# Patient Record
Sex: Male | Born: 1960 | Race: White | Hispanic: No | Smoking: Never smoker
Health system: Southern US, Community
[De-identification: ages and names within clinical notes are randomized; demographics above are authoritative.]

## PROBLEM LIST (undated history)

## (undated) DIAGNOSIS — E785 Hyperlipidemia, unspecified: Secondary | ICD-10-CM

## (undated) DIAGNOSIS — E039 Hypothyroidism, unspecified: Secondary | ICD-10-CM

## (undated) DIAGNOSIS — R011 Cardiac murmur, unspecified: Secondary | ICD-10-CM

## (undated) DIAGNOSIS — Z5189 Encounter for other specified aftercare: Secondary | ICD-10-CM

## (undated) DIAGNOSIS — I10 Essential (primary) hypertension: Secondary | ICD-10-CM

## (undated) DIAGNOSIS — J449 Chronic obstructive pulmonary disease, unspecified: Secondary | ICD-10-CM

## (undated) DIAGNOSIS — J45909 Unspecified asthma, uncomplicated: Secondary | ICD-10-CM

## (undated) DIAGNOSIS — M549 Dorsalgia, unspecified: Secondary | ICD-10-CM

## (undated) DIAGNOSIS — E781 Pure hyperglyceridemia: Secondary | ICD-10-CM

## (undated) DIAGNOSIS — M1990 Unspecified osteoarthritis, unspecified site: Secondary | ICD-10-CM

## (undated) DIAGNOSIS — T7840XA Allergy, unspecified, initial encounter: Secondary | ICD-10-CM

## (undated) DIAGNOSIS — D649 Anemia, unspecified: Secondary | ICD-10-CM

## (undated) HISTORY — DX: Chronic obstructive pulmonary disease, unspecified: J44.9

## (undated) HISTORY — DX: Allergy, unspecified, initial encounter: T78.40XA

## (undated) HISTORY — DX: Pure hyperglyceridemia: E78.1

## (undated) HISTORY — DX: Encounter for other specified aftercare: Z51.89

## (undated) HISTORY — PX: KNEE SURGERY: SHX244

## (undated) HISTORY — PX: FRACTURE SURGERY: SHX138

## (undated) HISTORY — PX: JOINT REPLACEMENT: SHX530

## (undated) HISTORY — DX: Hypothyroidism, unspecified: E03.9

## (undated) HISTORY — DX: Unspecified osteoarthritis, unspecified site: M19.90

## (undated) HISTORY — DX: Cardiac murmur, unspecified: R01.1

## (undated) HISTORY — DX: Unspecified asthma, uncomplicated: J45.909

## (undated) HISTORY — DX: Anemia, unspecified: D64.9

## (undated) HISTORY — DX: Hyperlipidemia, unspecified: E78.5

---

## 2003-04-02 ENCOUNTER — Inpatient Hospital Stay (HOSPITAL_COMMUNITY): Admission: EM | Admit: 2003-04-02 | Discharge: 2003-04-09 | Payer: Self-pay

## 2004-05-19 ENCOUNTER — Inpatient Hospital Stay (HOSPITAL_COMMUNITY): Admission: RE | Admit: 2004-05-19 | Discharge: 2004-05-22 | Payer: Self-pay | Admitting: Orthopedic Surgery

## 2004-06-23 ENCOUNTER — Inpatient Hospital Stay (HOSPITAL_COMMUNITY): Admission: RE | Admit: 2004-06-23 | Discharge: 2004-06-27 | Payer: Self-pay | Admitting: Emergency Medicine

## 2004-06-24 ENCOUNTER — Encounter: Payer: Self-pay | Admitting: Orthopedic Surgery

## 2004-07-27 ENCOUNTER — Observation Stay (HOSPITAL_COMMUNITY): Admission: RE | Admit: 2004-07-27 | Discharge: 2004-07-28 | Payer: Self-pay | Admitting: Orthopedic Surgery

## 2005-04-21 ENCOUNTER — Emergency Department (HOSPITAL_COMMUNITY): Admission: EM | Admit: 2005-04-21 | Discharge: 2005-04-21 | Payer: Self-pay | Admitting: Emergency Medicine

## 2005-05-25 ENCOUNTER — Inpatient Hospital Stay (HOSPITAL_COMMUNITY): Admission: RE | Admit: 2005-05-25 | Discharge: 2005-05-30 | Payer: Self-pay | Admitting: Orthopedic Surgery

## 2005-06-22 ENCOUNTER — Encounter: Admission: RE | Admit: 2005-06-22 | Discharge: 2005-06-22 | Payer: Self-pay | Admitting: Orthopedic Surgery

## 2005-08-22 ENCOUNTER — Encounter: Admission: RE | Admit: 2005-08-22 | Discharge: 2005-08-22 | Payer: Self-pay | Admitting: Orthopedic Surgery

## 2005-10-23 ENCOUNTER — Encounter: Admission: RE | Admit: 2005-10-23 | Discharge: 2005-10-23 | Payer: Self-pay | Admitting: Orthopedic Surgery

## 2006-02-14 ENCOUNTER — Encounter: Admission: RE | Admit: 2006-02-14 | Discharge: 2006-02-14 | Payer: Self-pay | Admitting: Orthopedic Surgery

## 2006-02-16 ENCOUNTER — Encounter: Admission: RE | Admit: 2006-02-16 | Discharge: 2006-02-16 | Payer: Self-pay | Admitting: Orthopedic Surgery

## 2006-07-21 IMAGING — CR DG FEMUR 2+V*R*
5 series · 5 of 5 positions shown · non-contrast
Comparison: 04/21/05 and post operative films from 05/25/05.

CLINICAL DATA: 44 year old with removal of plates and screws, bone graft, and intramedullary rod placement right femur. 
 TWO VIEW RIGHT FEMUR:

[t femur with hip  ap right]
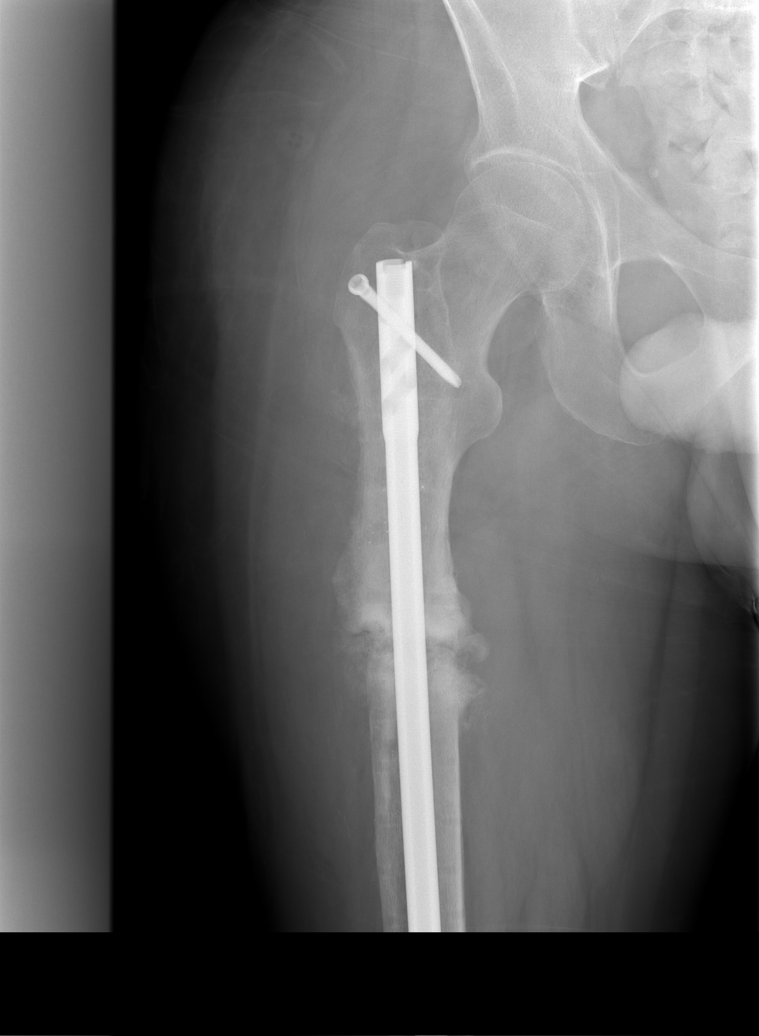

[t femur with knee ap right]
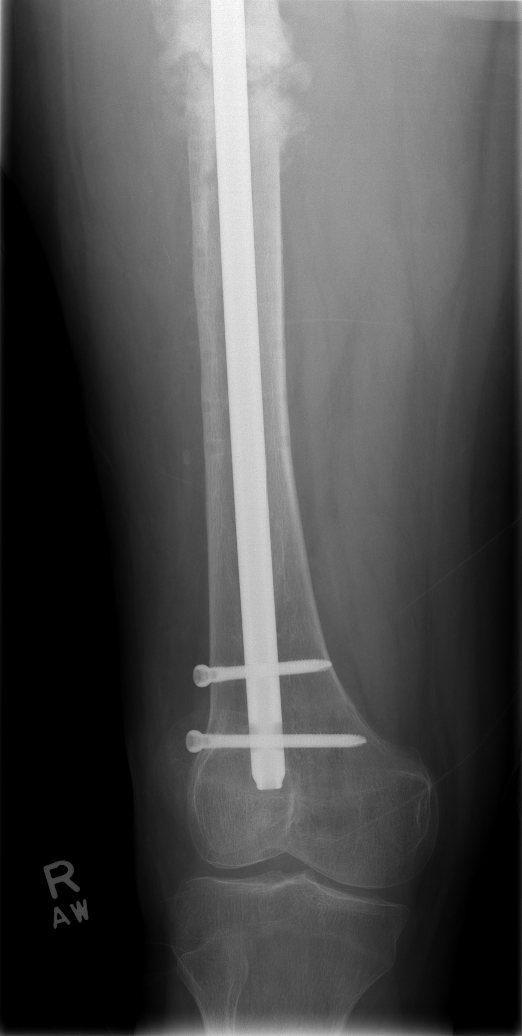

[t femur with hip lat right (1 of 2)]
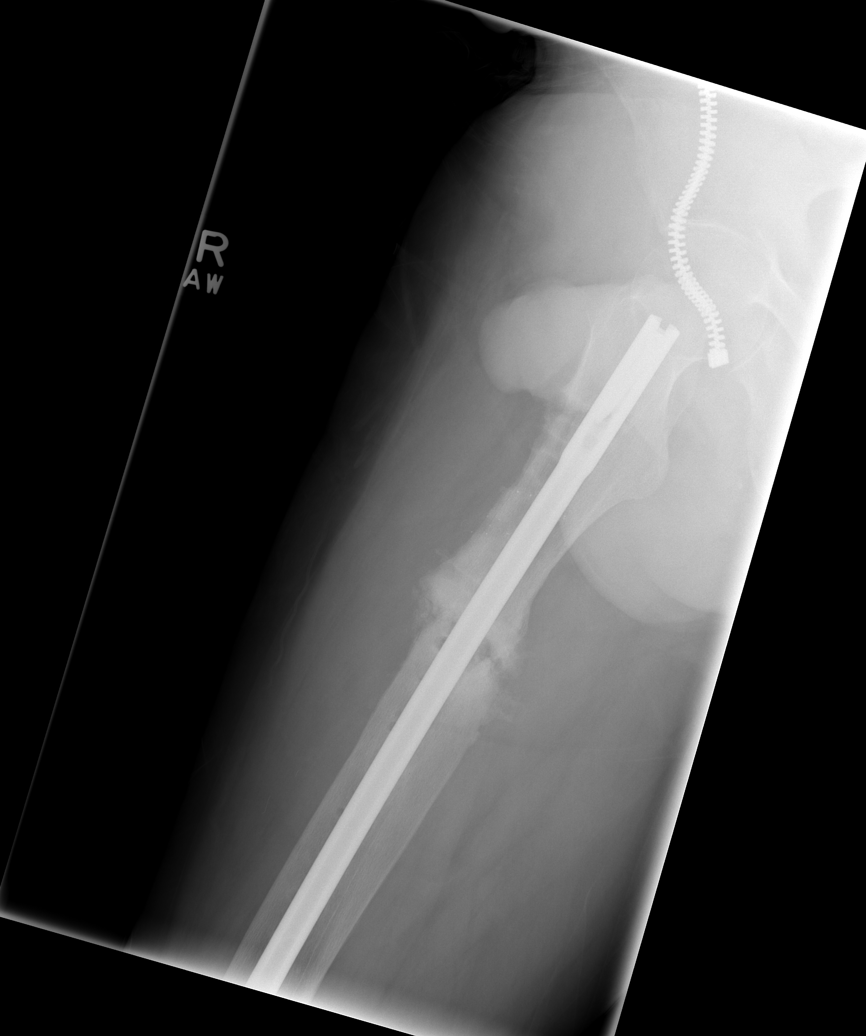

[t femur with hip lat right (2 of 2)]
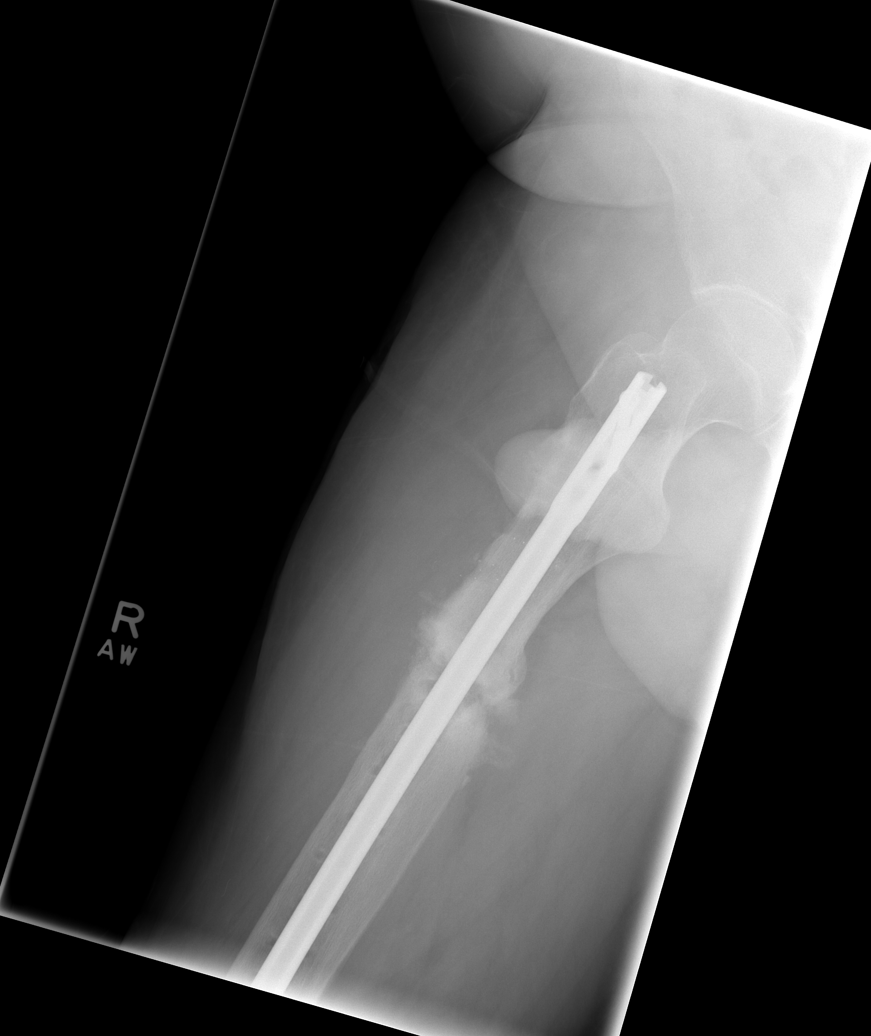

[t femur with knee lat right]
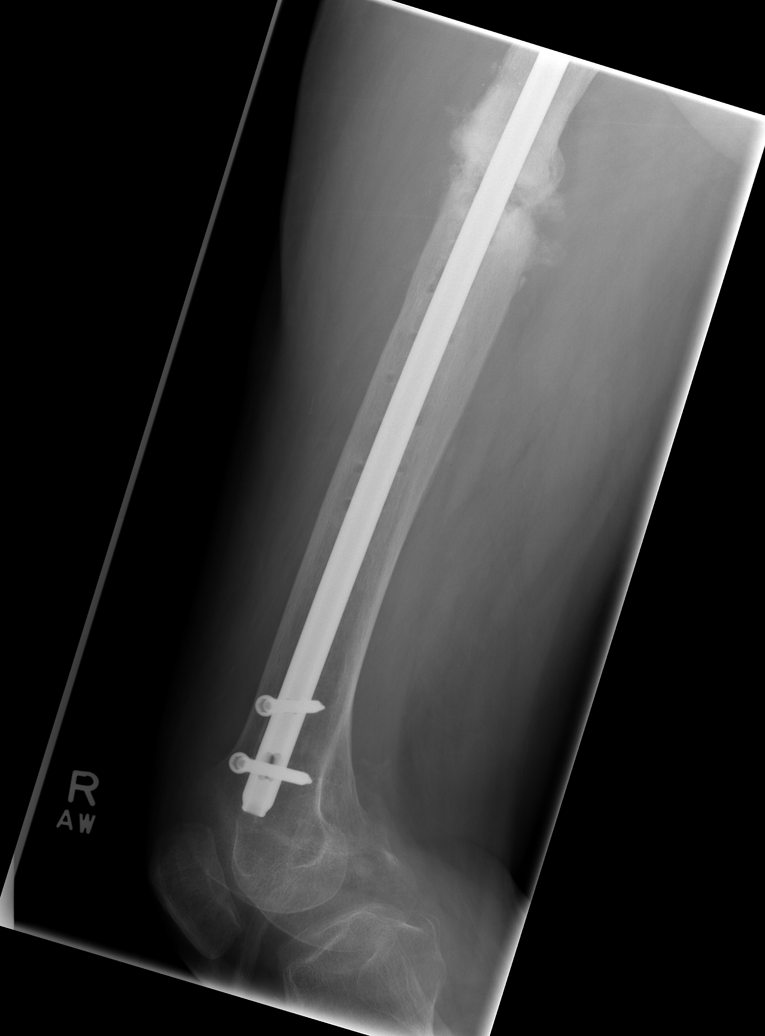

[5 of 5 positions shown; findings below may reference images not displayed]

There does appear to be some bony ingrowth at the fracture site.  The screw holes are still visualized.  The proximal and interlocking screws at intact.
IMPRESSION: 1.  Some evidence of bony ingrowth at the fracture site.

## 2006-08-22 ENCOUNTER — Ambulatory Visit (HOSPITAL_COMMUNITY): Admission: RE | Admit: 2006-08-22 | Discharge: 2006-08-22 | Payer: Self-pay | Admitting: Orthopedic Surgery

## 2006-09-07 ENCOUNTER — Encounter: Admission: RE | Admit: 2006-09-07 | Discharge: 2006-09-07 | Payer: Self-pay | Admitting: Orthopedic Surgery

## 2006-10-17 ENCOUNTER — Encounter: Admission: RE | Admit: 2006-10-17 | Discharge: 2006-10-17 | Payer: Self-pay | Admitting: Orthopedic Surgery

## 2007-04-18 ENCOUNTER — Encounter: Admission: RE | Admit: 2007-04-18 | Discharge: 2007-04-18 | Payer: Self-pay | Admitting: Orthopedic Surgery

## 2007-10-31 ENCOUNTER — Encounter: Admission: RE | Admit: 2007-10-31 | Discharge: 2007-10-31 | Payer: Self-pay | Admitting: Orthopedic Surgery

## 2008-05-06 ENCOUNTER — Encounter: Admission: RE | Admit: 2008-05-06 | Discharge: 2008-05-06 | Payer: Self-pay | Admitting: Anesthesiology
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
## 2009-05-13 ENCOUNTER — Encounter: Admission: RE | Admit: 2009-05-13 | Discharge: 2009-05-13 | Payer: Self-pay | Admitting: Orthopedic Surgery

## 2009-12-22 ENCOUNTER — Encounter: Admission: RE | Admit: 2009-12-22 | Discharge: 2009-12-22 | Payer: Self-pay | Admitting: Orthopedic Surgery

## 2010-05-01 ENCOUNTER — Encounter: Payer: Self-pay | Admitting: Orthopedic Surgery

## 2010-07-26 ENCOUNTER — Ambulatory Visit
Admission: RE | Admit: 2010-07-26 | Discharge: 2010-07-26 | Disposition: A | Discharge status: Home / Self Care | Payer: PRIVATE HEALTH INSURANCE | Source: Ambulatory Visit | Attending: Orthopedic Surgery | Admitting: Orthopedic Surgery

## 2010-07-26 ENCOUNTER — Other Ambulatory Visit: Payer: Self-pay | Admitting: Orthopedic Surgery

## 2010-07-26 DIAGNOSIS — R52 Pain, unspecified: Secondary | ICD-10-CM

## 2010-07-26 DIAGNOSIS — M25551 Pain in right hip: Secondary | ICD-10-CM

## 2010-08-23 NOTE — Op Note (Signed)
NAME:  Jesse Bell, Jesse Bell                   ACCOUNT NO.:  [id number]   MEDICAL RECORD NO.:  [id number]          PATIENT TYPE:  AMB   LOCATION:  SDS                          FACILITY:  [facility]   PHYSICIAN:  Myrtie Neither, MD      DATE OF BIRTH:  Jun 22, 1960   DATE OF PROCEDURE:  08/22/2006  DATE OF DISCHARGE:                               OPERATIVE REPORT   PREOPERATIVE DIAGNOSIS:  Nonunion, right femur, with internal fixation,  right femur was.   POSTOPERATIVE DIAGNOSIS:  Nonunion, right femur, with internal fixation,  right femur was.   ANESTHESIA:  General.   PROCEDURE:  Removal of distal transfixing screw hardware, right femur.   The patient was taken to the operating room after given adequate preop  medications, given general anesthesia and intubated.  Right lower  extremity was prepped with DuraPrep and draped in a sterile manner.  Bovie used for hemostasis.  C-arm was used to visualize the two distal  transfixing screws and the nonunion site.  A lateral incision was made  over the previous scar just above the knee going through the skin and  subcutaneous tissue down to the fascia.  Sharp and blunt dissection was  made down to the femur, exposing the heads of the two transfixing  screws.  Both screws were removed.  Then visualization of the nonunion  site was done, which revealed that the patient does have some bone  crossing the fracture site and both proximal and distal fragments move  in unison on rotation.  Copious irrigation was then done.  Wound closure  was then done with 0 Vicryl for the fascia, 2-0 for the subcutaneous and  skin staples for the skin.  A bulky compressive dressing was applied.  The patient tolerated the procedure quite well and went to the recovery  room in stable and satisfactory position.  The patient is being  discharged home on Percocet one to two q.4h. p.r.n. for pain, ice packs,  partial weightbearing on the right side with the use of crutches, and  return to the office in 1 week.  The patient is being discharged in  stable and satisfactory condition.      Myrtie Neither, MD  Electronically Signed     AC/MEDQ  D:  08/22/2006  T:  08/22/2006  Job:  [id number]

## 2010-08-26 NOTE — Op Note (Signed)
NAME:  Jesse [name], Jesse [name]                   ACCOUNT NO.:  [id number]   MEDICAL RECORD NO.:  [id number]          PATIENT TYPE:  INP   LOCATION:  1513                         FACILITY:  [facility]   PHYSICIAN:  Myrtie Neither, MD      DATE OF BIRTH:  1960-07-22   DATE OF PROCEDURE:  05/25/2005  DATE OF DISCHARGE:  05/30/2005                                 OPERATIVE REPORT   PREOPERATIVE DIAGNOSIS:  Nonunion right femur with broken plate screws right  femur.   POSTOPERATIVE DIAGNOSIS:  Nonunion right femur with broken plate screws  right femur.   ANESTHESIA:  General.   PROCEDURE:  Removal of plate and screws right femur, open reduction and  internal fixation with IM rod, Smith & Nephew rod, Symphony bone graft to  fracture site.   The patient was taken to the operating room after given adequate preop  medication and given general anesthesia and intubated. The right lower  extremity was prepped with DuraPrep and draped in a sterile manner. Bovie  used for hemostasis. C-arm was used to visualize fracture reduction and  placement of the rod. A lateral incision made over the right thigh extending  over the old previous scar from the trochanter all the way down to the  distal third of the thigh. Going through the skin, subcutaneous tissue down  to the fascial plane, sharp and blunt dissection was made through muscle  mass down to the plate. The plate was identified, copious and abundant  fibrous tissue was found. Sharp and blunt dissection as well as with the use  of osteotome was used to dissect the soft tissue from the femur and the  plate. The screws were removed from the plate. Four broken screws were also  identified and with reverse overhead drilling these broken ends were also  removed. After removing of all the screws and the plate, sharp and blunt  dissection was made at the nonunion site removing extensive fibrous tissue  about the fascial site as well as in the canal. The incision was  extended  further proximally for placement of the guidepin down the femoral canal.  Reaming was done down the canal in reverse fashion coming from mid shaft to  proximal. Guidewire was then placed across the fracture site, reduction of  the fracture site was then done followed by reaming over the guidewire.  Reaming was done with an 11.5 x 38 cm nail. After adequate reaming across  the fracture site, the nail was then measured and appropriate size which was  11.5 mm x 28 cm nail was selected. This was hammered down from proximal to  distal with very good rigid fixation and very good bone to bone contact with  very good reduction. A proximal transfixing screw was then placed to the  left trochanter. With the fracture site reduced, Symphony bone graft was  impacted about the fracture site with a punch. Next, further impaction of  the fracture site was done followed by placement of two distal transfixing  screws. The reduction was very good rigid fixation. __________ pulse was  then used to irrigate the wound. Symphony spray was also used about the area  for hemostasis. A large __________ Hemovac drain was placed into the wound  exiting distally followed by wound closure, 1-0 and #0 for  the fascia, 2-0 for the subcutaneous and skin staples for the skin. A  compressive dressing was applied, knee immobilizer was applied. The patient  tolerated the procedure quite well and went to the recovery room in stable  and satisfactory condition. Blood loss was approximately __________  mL.      Myrtie Neither, MD  Electronically Signed     AC/MEDQ  D:  05/29/2005  T:  05/30/2005  Job:  [id number]
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
## 2010-08-26 NOTE — Op Note (Signed)
NAME:  Jesse [name], Jesse [name]                   ACCOUNT NO.:  [id number]   MEDICAL RECORD NO.:  [id number]          PATIENT TYPE:  INP   LOCATION:  5014                         FACILITY:  [facility]   PHYSICIAN:  Myrtie Neither, MD      DATE OF BIRTH:  Jun 09, 1960   DATE OF PROCEDURE:  DATE OF DISCHARGE:  06/24/2004                                 OPERATIVE REPORT   PREOPERATIVE DIAGNOSIS:  Refracture of the right femur with broken plate and  screws right femur.   POSTOPERATIVE DIAGNOSIS:  Refracture of the right femur with broken plate  and screws right femur.   OPERATION:  1.  ORIF, right femur.  2.  Removal of plate and screws, right femur.   The patient was taken to the operating room, after being given adequate pre-  op medication, given general anesthesia, intubated.  The right hip was  prepped with DuraPrep and draped in a sterile manner.  A lateral incision  was made over the right thigh going through the skin, subcutaneous tissue,  down to the fascia lata, down to the fracture site.  Hematoma was evacuated,  irrigated with antibiotic solution.  Soft and sharp blunt dissection was  made down to the femur.  The compression plate and screws were removed.  The  fracture site itself was re-stabilized.  __________ about the fracture site  was done with a osteotome to enrich blood flow and bone formation.  A 14-  hole locking plate was selected and fixated with multiple screws.  This was  visualized directly as well as with C-arm.  Good reduction and stable  fixation was obtained.  Irrigation of the area was then done.  Wound closure  was then done with the use of 0 Vicryl for the fascia, 2-0 for the  subcutaneous, and skin staples for the skin.  Hemovac drain was placed into  the wound.  Compressive dressing was applied.  The patient tolerated the  procedure quite well and went to the recovery room in stable and  satisfactory condition.       AC/MEDQ  D:  09/14/2004  T:  09/14/2004  Job:   [id number]
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
## 2010-08-26 NOTE — Op Note (Signed)
NAME:  Jesse [name], Jesse [name]                   ACCOUNT NO.:  [id number]   MEDICAL RECORD NO.:  [id number]          PATIENT TYPE:  OIB   LOCATION:  2854                         FACILITY:  [facility]   PHYSICIAN:  Myrtie Neither, MD      DATE OF BIRTH:  [birth year]   DATE OF PROCEDURE:  05/19/2004  DATE OF DISCHARGE:                                 OPERATIVE REPORT   PREOPERATIVE DIAGNOSIS:  Nonunion fracture right femur with broken proximal  transfixing screw.   POSTOPERATIVE DIAGNOSIS:  Nonunion fracture right femur with broken proximal  transfixing screw.   ANESTHESIA:  General.   PROCEDURE:  Removal of intramedullary nail proximal and distal transfixing  screws, removal of cable wires from right femur, parameterization of the  femur, Symphony bone grafting to the right femur, open reduction and  internal fixation with ten hole compression plate.   DESCRIPTION OF PROCEDURE:  The patient was taken to the operating room after  given adequate preop medications, given general anesthesia, and intubated.  The right lower extremity was prepped with DuraPrep and draped in a sterile  manner.  Bovie used for hemostasis.  C-arm was used to visualize hardware  and reduction of the fracture.  A lateral incision was made over the right  thigh going through the previous scar down through the skin, subcutaneous  tissue, fascia, and vastus lateralis.  Sharp and blunt dissection was made  down to the nonunion site.  Two cable wires were identified and removed.  Fibrotic tissue at the fracture site was resected with the use of curets and  rongeurs.  After abundant debridement of the area, the proximal screw was  identified and removed.  Parameterization of both fracture ends was done to  increase bone healing.  The femur was osteotomized to allow bone to bone  contact for reduction of the fracture.  After osteotomy of the fracture site  and thorough debridement of both bony ends removing the fibrotic tissue and  collagen, approximation of the bone ends was found to be very good.  Next,  attention was turned to the posterior fragment of the proximal screw that  was broken off.  A separate incision was made posteriorly.  Blunt dissection  was made down to the posterior fragmented screw.  The screw was over drilled  followed by removal of the distal fragment of the screw.  Next, Symphony  bone grafting material was mixed.  We used two 15 mg containers of bone.  Blood was drawn from the patient and the serum spun down and the bone  mixture was made.  An anterior incision was made going from the midline down  through the skin and subcutaneous tissue, paramedian incision was made into  the capsule.  Curettement of overgrown bone was removed and the distal end  of the IM rod was identified.  An attempt was made to reduce the fracture  with the IM rod in place, but this was not possible.  Two distal transfixing  screws were removed from the femur and rod.  The rod extractor was attached  to the  rod and the rod was removed.  Both distal wounds were copiously  irrigated with antibiotic solution.  The fracture was reduced, bone graft  placed into the canal as well as medial, lateral, posteriorly and inferiorly  about the nonunion site.  This was stabilized with a ten hole compression  plate with the use of nine screws.  Anatomic reduction was possible and the  wounds were then irrigated with Simpulse and the wounds were  closed with 0 Vicryl for the fascia, 2-0 for the subcutaneous, and skin  staples for the skin.  Compressive dressing was applied.  A knee immobilizer  was applied.  The patient tolerated the procedure well.  The patient did  receive 1 unit packed cells during the procedure.      AC/MEDQ  D:  05/19/2004  T:  05/19/2004  Job:  [id number]

## 2010-08-26 NOTE — Op Note (Signed)
NAME:  Jesse [name], Jesse [name]                             ACCOUNT NO.:  [id number]   MEDICAL RECORD NO.:  [id number]                   PATIENT TYPE:  INP   LOCATION:  1823                                 FACILITY:  [facility]   PHYSICIAN:  Myrtie Neither, M.D.                 DATE OF BIRTH:  1960/10/17   DATE OF PROCEDURE:  04/02/2003  DATE OF DISCHARGE:                                 OPERATIVE REPORT   PREOPERATIVE DIAGNOSES:  1. Comminuted fracture of right femur.  2. Laceration, right knee.   POSTOPERATIVE DIAGNOSES:  1. Comminuted fracture of the right femur.  2. Laceration, right knee, intra-articular involvement with debris.   SURGEON:  Myrtie Neither, M.D.   DESCRIPTION OF PROCEDURE:  The patient was taken to the operating room after  being given adequate premedication, intubated.  The patient was placed on  the fracture table.  Free arm was used to visualize fracture reduction.  Bovie was used for hemostasis.  Right knee wound was copiously irrigated  with antibiotic solution and saline.  Inspection of the joint revealed  small, dark green flecks of debris within the wound with the wound  penetrating into the joint itself.  Wound was extended both proximally and  distally.  Skin edges were debrided as well as subcutaneous tissue debrided.  After adequate debridement of the wound, next [name] midline incision was made  over the right knee going through skin and subcutaneous tissue down to the  capsule.  Medial paramedian incision made into the capsule.  Further  irrigation inside the joint was done with antibiotic solution, and further  inspection did not reveal any other fragments.  An awl was used to initiate  [name] hole, going through the notch.  Placement of guidewire was then done down  the femoral canal.  Reamings done down the femoral canal.  Guidewire was  placed across the fracture site after multiple manipulations.  After  placement of the guidewire, then reaming down the canal was done  up to 14  mm.  After adequate reaming, guidewires were switched, and then [name] 400 mm x  13 mm rod was then hammered across the fracture site over the guidewire.  This was found to be [name] very good snug fit also.  Traction was released to  allow impaction of the fracture.  After good placement of the rod, two cross  fixing screws were then placed distally.   Next, [name] lateral incision made over the right thigh, going through the skin  and subcutaneous tissue, fascia lata, and vastus lateralis down to the  comminuted fragments.  The fragments were manipulated into anatomic position  and packed with punch.  After anatomic reduction of the fracture fragments,  two beaded cables were then placed over the separate fragments, holding in  good stable and anatomic position.  Lastly, proximal cross fixing screw was  placed anterior to posterior.  Wound were then irrigated copiously with  antibiotic solution and saline.  The right thigh was closed first with use  of zero Vicryl to the fascia and muscle, 2-0 Vicryl for the subcutaneous,  and skin staples for the skin.  Further debridement of the knee was done and  irrigation with inspection of the joint followed by closure of the capsule  with zero Vicryl, 2-0 Vicryl for the subcutaneous, and skin staples for the  skin.  The previous laceration was closed also.  Compressive dressings were  applied.  Knee immobilizer was applied.  The patient tolerated the procedure  quite well and went to the recovery room in stable and satisfactory  condition.                                              Myrtie Neither, M.D.   AC/MEDQ  D:  04/02/2003  T:  04/03/2003  Job:  [id number]

## 2010-08-26 NOTE — H&P (Signed)
NAME:  Jesse [name], Jesse [name]                   ACCOUNT NO.:  [id number]   MEDICAL RECORD NO.:  [id number]          PATIENT TYPE:  INP   LOCATION:  1513                         FACILITY:  [facility]   PHYSICIAN:  Myrtie Neither, MD      DATE OF BIRTH:  March 30, 1961   DATE OF ADMISSION:  05/25/2005  DATE OF DISCHARGE:  05/30/2005                                HISTORY & PHYSICAL   CHIEF COMPLAINT:  Painful right hip union.   HISTORY OF PRESENT ILLNESS:  This is a 50 year old male who has been  followed in the office for a fractured right femur.  The patient had  previous ORIF of his right femur.  He did sustain breakage of hardware and  dislodgment after a fall down a flight of stairs.  The patient most  recently, approximately six months ago, had ORIF with bone grafting and the  plate and screws broken.  The patient still has persistent nonunion with  swelling and pain in the right hip.   PAST MEDICAL HISTORY:  1.  Comminuted right femoral fracture with ORIF x2.  2.  History of heart murmur.  3.  History of high blood pressure, diabetes.   ALLERGIES:  None known.   MEDICATIONS:  Percocet, Lodine, multivitamins, decongestant.   SOCIAL HISTORY:  No history of use of tobacco, medication or use of alcohol.   FAMILY HISTORY:  Noncontributory.   REVIEW OF SYSTEMS:  As in history of present illness.  No cardiac,  respiratory, urinary , bowel symptoms.   PHYSICAL EXAMINATION:  GENERAL:  Alert and oriented, in no acute distress.  VITAL SIGNS:  Temperature 96.9, pulse 63, respirations 16, blood pressure  126/88.  Height 69 inches. Weight 176 pounds.  HEENT:  Normocephalic.  Conjunctivae is clear.  NECK:  Supple.  CHEST:  Clear.  HEART:  S1 and S2.  Regular.  EXTREMITIES:  Right hip with old surgical scar.  Soft tissue swelling  posterolaterally of the right hip.  Range of motion is good at both right  hip and knee.  Some tenderness to palpation over the proximal end of the  femur.  __________ four  screws with compression plate being separated from  the dome and nonunion at the fracture site.   IMPRESSION:  1.  Broken hardware, right femur.  2.  Non-union, right femur.   PLAN:  Removal of hardware, right femur, ORIF with the [employer] rod  and use of Symphony bone graft.      Myrtie Neither, MD  Electronically Signed     AC/MEDQ  D:  06/08/2005  T:  06/08/2005  Job:  [id number]

## 2010-08-26 NOTE — Discharge Summary (Signed)
NAME:  Jesse [name], Jesse [name]                   ACCOUNT NO.:  [id number]   MEDICAL RECORD NO.:  [id number]          PATIENT TYPE:  INP   LOCATION:  1513                         FACILITY:  [facility]   PHYSICIAN:  Myrtie Neither, MD      DATE OF BIRTH:  1960-05-23   DATE OF ADMISSION:  05/25/2005  DATE OF DISCHARGE:  05/30/2005                                 DISCHARGE SUMMARY   ADMISSION DIAGNOSES:  1.  Broken hardware, right femur.  2.  Non-union right femur.   DISCHARGE DIAGNOSES:  1.  Broken hardware, right femur.  2.  Non-union right femur.   COMPLICATIONS:  None.   OPERATION/PROCEDURE:  Removal of hardware, ORIF, right femur, bone grafting  right femur.   HISTORY:  This is a 50 year old male who has history of non-union and broken  hardware of the right femur.  The patient has had previous surgery on the  right femur for the same problem.  Apparently, __________ through right hip  and thigh old surgical scar.  Tender, soft tissue swelling, proximal end of  the femur.  Range of motion right hip and knee is good.  X-ray revealed  appropriate screws with side plate, right femur and non-union at the  fracture site.   HOSPITAL COURSE:  The patient had preoperative laboratory, CBC, CMET, EKG,  chest x-ray, PT/PTT, and platelet count prior to surgery.  The patient's lab  tests were found to be stable enough to undergo surgery.  The patient  underwent removal of hardware, ORIF, right femur and bone grafting, right  femur on May 25, 2005.  He tolerated the procedure quite well.  Postoperative course was fairly benign.  The patient was placed on IV pain  medication, IV antibiotics.  The patient's swelling subsided.  Physical  therapy was started with toe touch weightbearing on the right side and range  of motion of the knee.  The patient progressed slowly well enough to be  discharged and was discharged on Percocet 10/650, ice packs, toe touch  weightbearing on the right side. Return to the  office in one week. The  patient was discharged in stable condition and satisfactory condition.      Myrtie Neither, MD  Electronically Signed     AC/MEDQ  D:  06/08/2005  T:  06/08/2005  Job:  [id number]
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
## 2010-08-26 NOTE — Op Note (Signed)
NAME:  Jesse [name], Jesse [name]                   ACCOUNT NO.:  [id number]   MEDICAL RECORD NO.:  [id number]          PATIENT TYPE:  INP   LOCATION:  0005                         [facility]   PHYSICIAN:  Myrtie Neither, MD      DATE OF BIRTH:  February 28, 1961   DATE OF PROCEDURE:  07/27/2004  DATE OF DISCHARGE:                                 OPERATIVE REPORT   PREOPERATIVE DIAGNOSES:  Abscess right hip.   POSTOPERATIVE DIAGNOSES:  Abscess right hip.   ANESTHESIA:  General.   PROCEDURE:  Incision and drainage of right hip and placement of Penrose  drain.  Patient was taken to operating room after given adequate  preoperative medications.  Patient's right hip was prepped and draped in  sterile manner.  Bovie used for hemostasis.  Incision was made through the  abscess.  Thorough debridement with a curette was done.  The abscess had a  base not penetrating the fascia just down to the subcutaneous tissue.  Complete debridement was done.  Copious irrigation with Simpulse was done.  Penrose drain was placed into the wound.  Wound closure then done with 2-0  Vicryl for the subcutaneous and 2-0 nylon for the skin.  Penrose drain was  placed into the wound.  Compressive dressing was applied.  Patient tolerated  the procedure quite well, went to recovery room in stable and satisfactory  condition.      AC/MEDQ  D:  07/27/2004  T:  07/27/2004  Job:  [id number]

## 2011-02-09 ENCOUNTER — Emergency Department (HOSPITAL_COMMUNITY)
Admission: EM | Admit: 2011-02-09 | Discharge: 2011-02-09 | Disposition: A | Discharge status: Home / Self Care | Payer: Medicare Other | Attending: Emergency Medicine | Admitting: Emergency Medicine

## 2011-02-09 DIAGNOSIS — M549 Dorsalgia, unspecified: Secondary | ICD-10-CM | POA: Insufficient documentation

## 2011-03-09 ENCOUNTER — Ambulatory Visit
Admission: RE | Admit: 2011-03-09 | Discharge: 2011-03-09 | Disposition: A | Discharge status: Home / Self Care | Payer: Medicare Other | Source: Ambulatory Visit | Attending: Orthopedic Surgery | Admitting: Orthopedic Surgery

## 2011-03-09 ENCOUNTER — Other Ambulatory Visit: Payer: Self-pay | Admitting: Orthopedic Surgery

## 2011-03-09 DIAGNOSIS — M545 Low back pain: Secondary | ICD-10-CM

## 2011-03-22 ENCOUNTER — Other Ambulatory Visit: Payer: Self-pay | Admitting: Orthopedic Surgery

## 2011-03-22 DIAGNOSIS — M545 Low back pain: Secondary | ICD-10-CM

## 2011-03-27 ENCOUNTER — Other Ambulatory Visit: Payer: Self-pay | Admitting: Orthopedic Surgery

## 2011-03-27 DIAGNOSIS — Z139 Encounter for screening, unspecified: Secondary | ICD-10-CM

## 2011-03-29 ENCOUNTER — Ambulatory Visit
Admission: RE | Admit: 2011-03-29 | Discharge: 2011-03-29 | Disposition: A | Discharge status: Home / Self Care | Payer: Medicare Other | Source: Ambulatory Visit | Attending: Orthopedic Surgery | Admitting: Orthopedic Surgery

## 2011-03-29 DIAGNOSIS — Z139 Encounter for screening, unspecified: Secondary | ICD-10-CM

## 2011-03-29 DIAGNOSIS — M545 Low back pain: Secondary | ICD-10-CM

## 2011-06-20 ENCOUNTER — Other Ambulatory Visit: Payer: Self-pay | Admitting: Orthopedic Surgery

## 2011-06-20 DIAGNOSIS — M549 Dorsalgia, unspecified: Secondary | ICD-10-CM

## 2011-06-23 ENCOUNTER — Other Ambulatory Visit: Payer: Medicare Other

## 2011-06-26 ENCOUNTER — Ambulatory Visit
Admission: RE | Admit: 2011-06-26 | Discharge: 2011-06-26 | Disposition: A | Discharge status: Home / Self Care | Payer: Medicare Other | Source: Ambulatory Visit | Attending: Orthopedic Surgery | Admitting: Orthopedic Surgery

## 2011-06-26 ENCOUNTER — Other Ambulatory Visit: Payer: Self-pay | Admitting: Orthopedic Surgery

## 2011-06-26 VITALS — BP 120/74 | HR 74

## 2011-06-26 DIAGNOSIS — M549 Dorsalgia, unspecified: Secondary | ICD-10-CM

## 2011-06-26 MED ORDER — IOHEXOL 180 MG/ML  SOLN
1.0000 mL | Freq: Once | INTRAMUSCULAR | Status: AC | PRN
  Administered 2011-06-26: 1 mL via EPIDURAL

## 2011-06-26 MED ORDER — METHYLPREDNISOLONE ACETATE 40 MG/ML INJ SUSP (RADIOLOG
120.0000 mg | Freq: Once | INTRAMUSCULAR | Status: AC
  Administered 2011-06-26: 120 mg via EPIDURAL

## 2011-06-26 NOTE — Discharge Instructions (Signed)
 Post Procedure Spinal Discharge Instruction Sheet  1. You may resume a regular diet and any medications that you routinely take (including pain medications).  2. No driving day of procedure.  3. Light activity throughout the rest of the day.  Do not do any strenuous work, exercise, bending or lifting.  The day following the procedure, you can resume normal physical activity but you should refrain from exercising or physical therapy for at least three days thereafter.   Common Side Effects:   Headaches- take your usual medications as directed by your physician.  Increase your fluid intake.  Caffeinated beverages may be helpful.  Lie flat in bed until your headache resolves.   Restlessness or inability to sleep- you may have trouble sleeping for the next few days.  Ask your referring physician if you need any medication for sleep.   Facial flushing or redness- should subside within a few days.   Increased pain- a temporary increase in pain a day or two following your procedure is not unusual.  Take your pain medication as prescribed by your referring physician.   Leg cramps  Please contact our office at [phone] for the following symptoms:  Fever greater than 100 degrees.  Headaches unresolved with medication after 2-3 days.  Increased swelling, pain, or redness at injection site.  Thank you for visiting our office.

## 2011-07-11 ENCOUNTER — Ambulatory Visit
Admission: RE | Admit: 2011-07-11 | Discharge: 2011-07-11 | Disposition: A | Discharge status: Home / Self Care | Payer: Medicare Other | Source: Ambulatory Visit | Attending: Orthopedic Surgery | Admitting: Orthopedic Surgery

## 2011-07-11 DIAGNOSIS — M549 Dorsalgia, unspecified: Secondary | ICD-10-CM

## 2011-07-11 MED ORDER — METHYLPREDNISOLONE ACETATE 40 MG/ML INJ SUSP (RADIOLOG
120.0000 mg | Freq: Once | INTRAMUSCULAR | Status: AC
  Administered 2011-07-11: 120 mg via EPIDURAL

## 2011-07-11 MED ORDER — IOHEXOL 180 MG/ML  SOLN
1.0000 mL | Freq: Once | INTRAMUSCULAR | Status: AC | PRN
  Administered 2011-07-11: 1 mL via EPIDURAL

## 2011-07-31 ENCOUNTER — Other Ambulatory Visit: Payer: Self-pay | Admitting: Orthopedic Surgery

## 2011-07-31 ENCOUNTER — Ambulatory Visit
Admission: RE | Admit: 2011-07-31 | Discharge: 2011-07-31 | Disposition: A | Discharge status: Home / Self Care | Payer: Medicare Other | Source: Ambulatory Visit | Attending: Orthopedic Surgery | Admitting: Orthopedic Surgery

## 2011-07-31 DIAGNOSIS — S7290XK Unspecified fracture of unspecified femur, subsequent encounter for closed fracture with nonunion: Secondary | ICD-10-CM

## 2011-07-31 DIAGNOSIS — M25551 Pain in right hip: Secondary | ICD-10-CM

## 2011-12-01 ENCOUNTER — Other Ambulatory Visit: Payer: Self-pay | Admitting: Neurosurgery

## 2011-12-01 DIAGNOSIS — M549 Dorsalgia, unspecified: Secondary | ICD-10-CM

## 2011-12-01 DIAGNOSIS — M5410 Radiculopathy, site unspecified: Secondary | ICD-10-CM

## 2011-12-06 ENCOUNTER — Ambulatory Visit
Admission: RE | Admit: 2011-12-06 | Discharge: 2011-12-06 | Disposition: A | Discharge status: Home / Self Care | Payer: Medicare Other | Source: Ambulatory Visit | Attending: Neurosurgery | Admitting: Neurosurgery

## 2011-12-06 VITALS — BP 131/78 | HR 91 | Ht 69.0 in | Wt 200.0 lb

## 2011-12-06 DIAGNOSIS — M5410 Radiculopathy, site unspecified: Secondary | ICD-10-CM

## 2011-12-06 DIAGNOSIS — M549 Dorsalgia, unspecified: Secondary | ICD-10-CM

## 2011-12-06 MED ORDER — DIAZEPAM 5 MG PO TABS
10.0000 mg | ORAL_TABLET | Freq: Once | ORAL | Status: AC
  Administered 2011-12-06: 10 mg via ORAL

## 2011-12-06 MED ORDER — IOHEXOL 180 MG/ML  SOLN
15.0000 mL | Freq: Once | INTRAMUSCULAR | Status: AC | PRN
  Administered 2011-12-06: 15 mL via INTRATHECAL

## 2011-12-06 MED ORDER — OXYCODONE-ACETAMINOPHEN 5-325 MG PO TABS
2.0000 | ORAL_TABLET | Freq: Once | ORAL | Status: AC
  Administered 2011-12-06: 2 via ORAL

## 2011-12-06 NOTE — Progress Notes (Signed)
Taking po's well, meds given for c/o low back pain. Trey Paula called to pick pt up at 245 pm.

## 2012-01-12 ENCOUNTER — Encounter (HOSPITAL_COMMUNITY): Payer: Self-pay | Admitting: Emergency Medicine

## 2012-01-12 ENCOUNTER — Emergency Department (HOSPITAL_COMMUNITY)
Admission: EM | Admit: 2012-01-12 | Discharge: 2012-01-13 | Disposition: A | Discharge status: Home / Self Care | Payer: Medicare Other | Attending: Emergency Medicine | Admitting: Emergency Medicine

## 2012-01-12 DIAGNOSIS — M545 Low back pain, unspecified: Secondary | ICD-10-CM | POA: Insufficient documentation

## 2012-01-12 DIAGNOSIS — Z79899 Other long term (current) drug therapy: Secondary | ICD-10-CM | POA: Insufficient documentation

## 2012-01-12 DIAGNOSIS — G8929 Other chronic pain: Secondary | ICD-10-CM | POA: Insufficient documentation

## 2012-01-12 HISTORY — DX: Essential (primary) hypertension: I10

## 2012-01-12 HISTORY — DX: Dorsalgia, unspecified: M54.9

## 2012-01-12 NOTE — ED Notes (Signed)
Pt alert, arrives from home, c/o low back pain, onset was chronic in nature, denies recent trauma or injury, pt states "i used my last pain medications" resp even unlabored, skin pwd

## 2012-01-13 MED ORDER — KETOROLAC TROMETHAMINE 60 MG/2ML IM SOLN
60.0000 mg | Freq: Once | INTRAMUSCULAR | Status: AC
Start: 2012-01-13 — End: 2012-01-13
  Administered 2012-01-13: 60 mg via INTRAMUSCULAR
  Filled 2012-01-13: qty 2

## 2012-01-13 MED ORDER — HYDROMORPHONE HCL PF 1 MG/ML IJ SOLN
1.0000 mg | Freq: Once | INTRAMUSCULAR | Status: AC
  Administered 2012-01-13: 1 mg via INTRAMUSCULAR
  Filled 2012-01-13: qty 1

## 2012-01-13 MED ORDER — HYDROCODONE-ACETAMINOPHEN 5-500 MG PO CAPS: 1.0000 | ORAL_CAPSULE | Freq: Four times a day (QID) | ORAL | Status: DC | PRN

## 2012-01-13 NOTE — ED Provider Notes (Signed)
History     CSN: [id number]  Arrival date & time 01/12/12  2321   First MD Initiated Contact with Patient 01/13/12 0054      Chief Complaint  Patient presents with  . Back Pain    (Consider location/radiation/quality/duration/timing/severity/associated sxs/prior treatment) HPI History provided by pt.   Pt presents w/ acute on chronic, severe, non-radiating low back pain since yesterday morning.  Associated w/ numbness in bilateral groin which he has experiences in the past.  Denies fever, bladder/bowel dysfunction and lower extremity weakness.  Denies new trauma.  Ran out of his lorcet which he takes for chronic low back and RLE pain yesterday morning.  Has been evaluated by Dr. Jeral Fruit, had a myelogram in 11/2011 and told that he will likely require surgery in the future.    Past Medical History  Diagnosis Date  . Back pain   . Hypertension     Past Surgical History  Procedure Date  . Joint replacement   . Fracture surgery     No family history on file.  History  Substance Use Topics  . Smoking status: Never Smoker   . Smokeless tobacco: Never Used  . Alcohol Use: No      Review of Systems  All other systems reviewed and are negative.    Allergies  Review of patient's allergies indicates no known allergies.  Home Medications   Current Outpatient Rx  Name Route Sig Dispense Refill  . DIPHENHYDRAMINE HCL 25 MG PO TABS Oral Take 25 mg by mouth every 6 (six) hours as needed. For allergies    . HYDROCODONE-ACETAMINOPHEN 10-650 MG PO TABS Oral Take 1 tablet by mouth every 6 (six) hours as needed.      BP 142/81  Pulse 89  Temp 98 F (36.7 C) (Oral)  Resp 16  SpO2 99%  Physical Exam  Nursing note and vitals reviewed. Constitutional: He is oriented to person, place, and time. He appears well-developed and well-nourished.  HENT:  Head: Normocephalic and atraumatic.  Eyes:       Normal appearance  Neck: Normal range of motion.  Cardiovascular: Normal rate  and regular rhythm.   Pulmonary/Chest: Effort normal and breath sounds normal.  Genitourinary:       No CVA ttp  Musculoskeletal:       Lumbar spinal and paraspinal ttp. Full active ROM of LE.  Nml patellar reflexes.  No saddle anesthesia. Distal sensation intact.  2+ DP pulses.    Neurological: He is alert and oriented to person, place, and time.  Skin: Skin is warm and dry. No rash noted.  Psychiatric: He has a normal mood and affect. His behavior is normal.    ED Course  Procedures (including critical care time)  Labs Reviewed - No data to display No results found.   1. Chronic low back pain       MDM  Pt presents w/ non-traumatic acute on chronic low back pain.  Afebrile, no NV deficits of LEs and nml DTRs on exam.  Pt treated w/ IM toradol and dilaudid and pain improved.  He is ambulatory.  D/c'd home w/ short course of Lorcet and recommendation to follow up with his neurosurgeon.  Return precautions discussed.         Otilio Miu, [place] 01/13/12 [phone]

## 2012-01-13 NOTE — ED Provider Notes (Signed)
Medical screening examination/treatment/procedure(s) were performed by non-physician practitioner and as supervising physician I was immediately available for consultation/collaboration.   Misk Galentine L Kelcy Baeten, MD 01/13/12 0712 

## 2012-01-13 NOTE — ED Notes (Signed)
PA at bedside.

## 2012-03-13 ENCOUNTER — Other Ambulatory Visit: Payer: Self-pay | Admitting: Internal Medicine

## 2012-03-13 NOTE — Telephone Encounter (Signed)
Chart pulled at nurses station DOS 03/14/11

## 2012-03-14 ENCOUNTER — Ambulatory Visit (INDEPENDENT_AMBULATORY_CARE_PROVIDER_SITE_OTHER): Payer: Medicare Other | Admitting: Family Medicine

## 2012-03-14 VITALS — BP 128/84 | HR 76 | Temp 97.2°F | Resp 18 | Ht 69.0 in | Wt 190.0 lb

## 2012-03-14 DIAGNOSIS — Z205 Contact with and (suspected) exposure to viral hepatitis: Secondary | ICD-10-CM

## 2012-03-14 DIAGNOSIS — Z20828 Contact with and (suspected) exposure to other viral communicable diseases: Secondary | ICD-10-CM

## 2012-03-14 NOTE — Progress Notes (Signed)
Subjective

## 2012-03-14 NOTE — Telephone Encounter (Signed)
Needs OV and labs, last seen 1 yr ago

## 2012-03-14 NOTE — Patient Instructions (Signed)
We will get back to you about the results of your tests. If for any reason you do not hear from me by early next week call back to see if the results are in.

## 2012-03-15 LAB — COMPREHENSIVE METABOLIC PANEL
ALT: 20 U/L (ref 0–53)
CO2: 24 mEq/L (ref 19–32)
Creat: 0.79 mg/dL (ref 0.50–1.35)
Glucose, Bld: 81 mg/dL (ref 70–99)
Total Bilirubin: 0.4 mg/dL (ref 0.3–1.2)

## 2012-03-15 LAB — HEPATITIS B SURFACE ANTIBODY, QUANTITATIVE: Hepatitis B-Post: 0.2 m[IU]/mL

## 2012-03-15 LAB — HEPATITIS B SURFACE ANTIGEN: Hepatitis B Surface Ag: NEGATIVE

## 2012-03-15 LAB — HEPATITIS C ANTIBODY: HCV Ab: NEGATIVE

## 2012-03-16 ENCOUNTER — Telehealth: Payer: Self-pay

## 2012-03-16 NOTE — Telephone Encounter (Signed)
Pt notified of labs

## 2012-03-16 NOTE — Telephone Encounter (Signed)
Returning our call for lab results  Best number [phone]

## 2012-04-16 ENCOUNTER — Other Ambulatory Visit: Payer: Self-pay | Admitting: *Deleted

## 2012-04-16 MED ORDER — LISINOPRIL 20 MG PO TABS: 20.0000 mg | ORAL_TABLET | Freq: Every day | ORAL | Status: DC

## 2012-05-03 ENCOUNTER — Ambulatory Visit (INDEPENDENT_AMBULATORY_CARE_PROVIDER_SITE_OTHER): Payer: Medicare Other | Admitting: Emergency Medicine

## 2012-05-03 VITALS — BP 107/70 | HR 75 | Temp 98.0°F | Resp 17 | Ht 70.0 in | Wt 196.0 lb

## 2012-05-03 DIAGNOSIS — M549 Dorsalgia, unspecified: Secondary | ICD-10-CM | POA: Insufficient documentation

## 2012-05-03 DIAGNOSIS — I10 Essential (primary) hypertension: Secondary | ICD-10-CM | POA: Insufficient documentation

## 2012-05-03 MED ORDER — LISINOPRIL 10 MG PO TABS: 10.0000 mg | ORAL_TABLET | Freq: Every day | ORAL | Status: DC

## 2012-05-03 MED ORDER — METOPROLOL SUCCINATE ER 50 MG PO TB24: 50.0000 mg | ORAL_TABLET | Freq: Every day | ORAL | Status: DC

## 2012-05-03 NOTE — Patient Instructions (Signed)

## 2012-05-03 NOTE — Progress Notes (Signed)
[facility] [address], [place] [address] [phone]  Date:  05/03/2012   Name:  Jesse Bell   DOB:  1961-02-20   MRN:  [id number]  PCP:  No primary provider on file.    Chief Complaint: Medication Refill   History of Present Illness:  Jesse Bell is a 52 y.o. very pleasant male patient who presents with the following:  History of hypertension and on last visit was 124/84.  No hypotensive symptoms, chest pain, shortness of breath, light headedness, syncope or other complaints.  There is no problem list on file for this patient.   Past Medical History  Diagnosis Date  . Back pain   . Hypertension     Past Surgical History  Procedure Date  . Joint replacement   . Fracture surgery     History  Substance Use Topics  . Smoking status: Never Smoker   . Smokeless tobacco: Never Used  . Alcohol Use: No    No family history on file.  No Known Allergies  Medication list has been reviewed and updated.  Current Outpatient Prescriptions on File Prior to Visit  Medication Sig Dispense Refill  . etodolac (LODINE) 200 MG capsule Take 200 mg by mouth every 8 (eight) hours.      Marland Kitchen lisinopril (PRINIVIL,ZESTRIL) 20 MG tablet Take 1 tablet (20 mg total) by mouth daily. Needs office visit  15 tablet  0  . metoprolol succinate (TOPROL-XL) 100 MG 24 hr tablet Take 100 mg by mouth daily. Take with or immediately following a meal.      . cyclobenzaprine (FLEXERIL) 10 MG tablet Take 10 mg by mouth 3 (three) times daily as needed.      . diphenhydrAMINE (BENADRYL) 25 MG tablet Take 25 mg by mouth every 6 (six) hours as needed. For allergies      . HYDROcodone-acetaminophen (LORCET) 10-650 MG per tablet Take 1 tablet by mouth every 6 (six) hours as needed.      . hydrocodone-acetaminophen (LORCET-HD) 5-500 MG per capsule Take 1 capsule by mouth every 6 (six) hours as needed for pain.  15 capsule  0    Review of Systems:  As per HPI, otherwise negative.     Physical Examination: Filed Vitals:   05/03/12 1350  BP: 107/70  Pulse: 75  Temp: 98 F (36.7 C)  Resp: 17   Filed Vitals:   05/03/12 1350  Height: 5\' 10"  (1.778 m)  Weight: 196 lb (88.905 kg)   Body mass index is 28.12 kg/(m^2). Ideal Body Weight: Weight in (lb) to have BMI = 25: 173.9   GEN: WDWN, NAD, Non-toxic, A & O x 3 HEENT: Atraumatic, Normocephalic. Neck supple. No masses, No LAD. Ears and Nose: No external deformity. CV: RRR, No M/G/R. No JVD. No thrill. No extra heart sounds. PULM: CTA B, no wheezes, crackles, rhonchi. No retractions. No resp. distress. No accessory muscle use. ABD: S, NT, ND, +BS. No rebound. No HSM. EXTR: No c/c/e NEURO Normal gait.  PSYCH: Normally interactive. Conversant. Not depressed or anxious appearing.  Calm demeanor.    Assessment and Plan: Hypertension Decrease lisinopril to 10 from 20. Verified toprol xl at 50 mg daily Follow up in two weeks for recheck of BP  Jesse Dane, MD

## 2012-07-30 ENCOUNTER — Other Ambulatory Visit: Payer: Self-pay | Admitting: Internal Medicine

## 2012-12-09 ENCOUNTER — Ambulatory Visit (INDEPENDENT_AMBULATORY_CARE_PROVIDER_SITE_OTHER): Payer: Medicare Other | Admitting: Family Medicine

## 2012-12-09 VITALS — BP 126/80 | HR 110 | Temp 98.3°F | Resp 18 | Ht 70.0 in | Wt 196.0 lb

## 2012-12-09 DIAGNOSIS — K047 Periapical abscess without sinus: Secondary | ICD-10-CM

## 2012-12-09 MED ORDER — PENICILLIN V POTASSIUM 500 MG PO TABS: 500.0000 mg | ORAL_TABLET | Freq: Four times a day (QID) | ORAL | Status: DC

## 2012-12-09 NOTE — Patient Instructions (Addendum)
°  Dental Abscess °A dental abscess is a collection of infected fluid (pus) from a bacterial infection in the inner part of the tooth (pulp). It usually occurs at the end of the tooth's root.  °CAUSES  °· Severe tooth decay. °· Trauma to the tooth that allows bacteria to enter into the pulp, such as a broken or chipped tooth. °SYMPTOMS  °· Severe pain in and around the infected tooth. °· Swelling and redness around the abscessed tooth or in the mouth or face. °· Tenderness. °· Pus drainage. °· Bad breath. °· Bitter taste in the mouth. °· Difficulty swallowing. °· Difficulty opening the mouth. °· Nausea. °· Vomiting. °· Chills. °· Swollen neck glands. °DIAGNOSIS  °· A medical and dental history will be taken. °· An examination will be performed by tapping on the abscessed tooth. °· X-rays may be taken of the tooth to identify the abscess. °TREATMENT °The goal of treatment is to eliminate the infection. You may be prescribed antibiotic medicine to stop the infection from spreading. A root canal may be performed to save the tooth. If the tooth cannot be saved, it may be pulled (extracted) and the abscess may be drained.  °HOME CARE INSTRUCTIONS °· Only take over-the-counter or prescription medicines for pain, fever, or discomfort as directed by your caregiver. °· Rinse your mouth (gargle) often with salt water (¼ tsp salt in 8 oz [250 ml] of warm water) to relieve pain or swelling. °· Do not drive after taking pain medicine (narcotics). °· Do not apply heat to the outside of your face. °· Return to your dentist for further treatment as directed. °SEEK MEDICAL CARE IF: °· Your pain is not helped by medicine. °· Your pain is getting worse instead of better. °SEEK IMMEDIATE MEDICAL CARE IF: °· You have a fever or persistent symptoms for more than 2 3 days. °· You have a fever and your symptoms suddenly get worse. °· You have chills or a very bad headache. °· You have problems breathing or swallowing. °· You have trouble  opening your mouth. °· You have swelling in the neck or around the eye. °Document Released: 03/27/2005 Document Revised: 12/20/2011 Document Reviewed: 07/05/2010 °[employer]® Patient Information ©2014 [employer]. ° ° °

## 2012-12-09 NOTE — Progress Notes (Signed)
Subjective:    Patient ID: Jesse Bell, male    DOB: [birth year], 52 y.o.   MRN: [id number]  HPI   Has had a toothache for the last few days and this morning the left lower side of his face is swollen.  He called dentist, Dr. Warren Danes, who told him to go to [facility] for antibiotics. He will call tomorrow to sched an appt - no antibiotic allergies or recent antibiotic use.  His PCP is on [address] and he is getting his BP meds from there.  Past Medical History  Diagnosis Date  . Back pain   . Hypertension    Current Outpatient Prescriptions on File Prior to Visit  Medication Sig Dispense Refill  . cyclobenzaprine (FLEXERIL) 10 MG tablet Take 10 mg by mouth 3 (three) times daily as needed.      . diphenhydrAMINE (BENADRYL) 25 MG tablet Take 25 mg by mouth every 6 (six) hours as needed. For allergies      . etodolac (LODINE) 200 MG capsule Take 200 mg by mouth every 8 (eight) hours.      Marland Kitchen HYDROcodone-acetaminophen (LORCET) 10-650 MG per tablet Take 1 tablet by mouth every 6 (six) hours as needed.      Marland Kitchen lisinopril (PRINIVIL,ZESTRIL) 10 MG tablet TAKE 1 TABLET DAILY FOR HYPERTENSION.  30 tablet  0  . metoprolol succinate (TOPROL-XL) 50 MG 24 hr tablet TAKE 1 TABLET ONCE DAILY WITH FOOD.  30 tablet  0  . hydrocodone-acetaminophen (LORCET-HD) 5-500 MG per capsule Take 1 capsule by mouth every 6 (six) hours as needed for pain.  15 capsule  0  . oxymorphone (OPANA ER) 5 MG 12 hr tablet Take 5 mg by mouth every 12 (twelve) hours.       No current facility-administered medications on file prior to visit.   No Known Allergies   Review of Systems  Constitutional: Positive for fatigue. Negative for fever, chills, diaphoresis and activity change.  HENT: Positive for facial swelling, mouth sores, neck pain and dental problem. Negative for sore throat, drooling and neck stiffness.   Respiratory: Negative for cough and shortness of breath.   Cardiovascular: Negative for chest pain and leg swelling.   Neurological: Positive for facial asymmetry and headaches. Negative for dizziness, syncope, weakness and light-headedness.  Hematological: Positive for adenopathy.      BP 126/80  Pulse 110  Temp(Src) 98.3 F (36.8 C) (Oral)  Resp 18  Ht 5\' 10"  (1.778 m)  Wt 196 lb (88.905 kg)  BMI 28.12 kg/m2  SpO2 98% Objective:   Physical Exam  Constitutional: He is oriented to person, place, and time. He appears well-developed and well-nourished. No distress.  HENT:  Head: Normocephalic and atraumatic.  Mouth/Throat: Mucous membranes are normal. No oral lesions. No trismus in the jaw. Abnormal dentition. Dental abscesses and dental caries present. No edematous.  Large golf ball size swelling over left submandibular area.  Eyes: No scleral icterus.  Pulmonary/Chest: Effort normal.  Lymphadenopathy:       Head (right side): No submental, no submandibular, no tonsillar, no preauricular, no posterior auricular and no occipital adenopathy present.       Head (left side): Submandibular and tonsillar adenopathy present. No submental, no preauricular, no posterior auricular and no occipital adenopathy present.    He has cervical adenopathy.       Right cervical: No superficial cervical adenopathy present.      Left cervical: Superficial cervical adenopathy present.  Neurological: He is alert and  oriented to person, place, and time.  Skin: Skin is warm and dry. He is not diaphoretic.  Psychiatric: He has a normal mood and affect. His behavior is normal.      Assessment & Plan:  Dental abscess Stay on penicillin until dentist visit. On pain contract for back so no additional pain meds rx'ed. Meds ordered this encounter  Medications  . PRAVASTATIN SODIUM PO    Sig: Take by mouth.  . penicillin v potassium (VEETID) 500 MG tablet    Sig: Take 1 tablet (500 mg total) by mouth 4 (four) times daily.    Dispense:  56 tablet    Refill:  0

## 2013-03-28 ENCOUNTER — Telehealth: Payer: Self-pay

## 2013-03-28 NOTE — Telephone Encounter (Signed)
fx from [facility] indicating pt may be overdue for colonoscopy. Advised pt and he stated that he will prob be in next month for check-up and may talk to prov then.

## 2013-04-16 ENCOUNTER — Ambulatory Visit: Payer: No Typology Code available for payment source

## 2013-04-16 ENCOUNTER — Ambulatory Visit (INDEPENDENT_AMBULATORY_CARE_PROVIDER_SITE_OTHER): Payer: No Typology Code available for payment source | Admitting: Family Medicine

## 2013-04-16 VITALS — BP 126/90 | HR 69 | Temp 97.6°F | Resp 19 | Wt 187.0 lb

## 2013-04-16 DIAGNOSIS — R05 Cough: Secondary | ICD-10-CM

## 2013-04-16 DIAGNOSIS — R059 Cough, unspecified: Secondary | ICD-10-CM

## 2013-04-16 DIAGNOSIS — E7800 Pure hypercholesterolemia, unspecified: Secondary | ICD-10-CM

## 2013-04-16 DIAGNOSIS — E785 Hyperlipidemia, unspecified: Secondary | ICD-10-CM

## 2013-04-16 DIAGNOSIS — I10 Essential (primary) hypertension: Secondary | ICD-10-CM

## 2013-04-16 DIAGNOSIS — J329 Chronic sinusitis, unspecified: Secondary | ICD-10-CM

## 2013-04-16 LAB — POCT CBC
GRANULOCYTE PERCENT: 59.6 % (ref 37–80)
HCT, POC: 40.9 % — AB (ref 43.5–53.7)
Hemoglobin: 12.9 g/dL — AB (ref 14.1–18.1)
Lymph, poc: 3.8 — AB (ref 0.6–3.4)
MCH, POC: 28.5 pg (ref 27–31.2)
MCHC: 31.5 g/dL — AB (ref 31.8–35.4)
MCV: 90.2 fL (ref 80–97)
MID (CBC): 0.7 (ref 0–0.9)
MPV: 9.6 fL (ref 0–99.8)
PLATELET COUNT, POC: 494 10*3/uL — AB (ref 142–424)
POC GRANULOCYTE: 6.6 (ref 2–6.9)
POC LYMPH %: 34.2 % (ref 10–50)
POC MID %: 6.2 % (ref 0–12)
RBC: 4.53 M/uL — AB (ref 4.69–6.13)
RDW, POC: 14 %
WBC: 11.1 10*3/uL — AB (ref 4.6–10.2)

## 2013-04-16 LAB — COMPREHENSIVE METABOLIC PANEL
ALBUMIN: 4.9 g/dL (ref 3.5–5.2)
ALK PHOS: 56 U/L (ref 39–117)
ALT: 11 U/L (ref 0–53)
AST: 17 U/L (ref 0–37)
BUN: 19 mg/dL (ref 6–23)
CHLORIDE: 103 meq/L (ref 96–112)
CO2: 23 mEq/L (ref 19–32)
Calcium: 10.4 mg/dL (ref 8.4–10.5)
Creat: 0.9 mg/dL (ref 0.50–1.35)
Glucose, Bld: 86 mg/dL (ref 70–99)
POTASSIUM: 4.5 meq/L (ref 3.5–5.3)
SODIUM: 137 meq/L (ref 135–145)
TOTAL PROTEIN: 7.9 g/dL (ref 6.0–8.3)
Total Bilirubin: 0.4 mg/dL (ref 0.3–1.2)

## 2013-04-16 LAB — LIPID PANEL
Cholesterol: 222 mg/dL — ABNORMAL HIGH (ref 0–200)
HDL: 58 mg/dL (ref >39)
LDL CALC: 118 mg/dL — AB (ref 0–99)
Total CHOL/HDL Ratio: 3.8 Ratio
Triglycerides: 232 mg/dL — ABNORMAL HIGH (ref <150)
VLDL: 46 mg/dL — ABNORMAL HIGH (ref 0–40)

## 2013-04-16 MED ORDER — AMOXICILLIN 875 MG PO TABS: 875.0000 mg | ORAL_TABLET | Freq: Two times a day (BID) | ORAL | Status: DC

## 2013-04-16 MED ORDER — METOPROLOL SUCCINATE ER 50 MG PO TB24: ORAL_TABLET | ORAL | Status: DC

## 2013-04-16 MED ORDER — PRAVASTATIN SODIUM 20 MG PO TABS: 20.0000 mg | ORAL_TABLET | Freq: Every day | ORAL | Status: DC

## 2013-04-16 MED ORDER — LISINOPRIL 10 MG PO TABS: ORAL_TABLET | ORAL | Status: DC

## 2013-04-16 NOTE — Patient Instructions (Addendum)
Drink plenty of fluids  Use Mucinex if the mucus seems to be getting too thick  Take amoxicillin one twice daily  Continue your blood pressure and cholesterol medications   Return in about 6 months to followup on blood pressure and cholesterol
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
## 2013-04-21 ENCOUNTER — Encounter: Payer: Self-pay | Admitting: Family Medicine

## 2013-06-10 ENCOUNTER — Other Ambulatory Visit: Payer: Self-pay

## 2013-06-10 DIAGNOSIS — E785 Hyperlipidemia, unspecified: Secondary | ICD-10-CM

## 2013-06-10 DIAGNOSIS — I10 Essential (primary) hypertension: Secondary | ICD-10-CM

## 2013-06-10 MED ORDER — METOPROLOL SUCCINATE ER 50 MG PO TB24: ORAL_TABLET | ORAL | Status: DC

## 2013-06-10 MED ORDER — LISINOPRIL 10 MG PO TABS: ORAL_TABLET | ORAL | Status: DC

## 2013-06-10 MED ORDER — PRAVASTATIN SODIUM 20 MG PO TABS: 20.0000 mg | ORAL_TABLET | Freq: Every day | ORAL | Status: DC

## 2013-08-25 ENCOUNTER — Ambulatory Visit
Admission: RE | Admit: 2013-08-25 | Discharge: 2013-08-25 | Disposition: A | Discharge status: Home / Self Care | Payer: Medicare Other | Source: Ambulatory Visit | Attending: Orthopedic Surgery | Admitting: Orthopedic Surgery

## 2013-08-25 ENCOUNTER — Other Ambulatory Visit: Payer: Self-pay | Admitting: Orthopedic Surgery

## 2013-08-25 DIAGNOSIS — M5430 Sciatica, unspecified side: Secondary | ICD-10-CM

## 2013-08-25 DIAGNOSIS — M545 Low back pain, unspecified: Secondary | ICD-10-CM

## 2013-10-20 ENCOUNTER — Other Ambulatory Visit: Payer: Self-pay | Admitting: Family Medicine

## 2013-12-12 ENCOUNTER — Other Ambulatory Visit: Payer: Self-pay | Admitting: Family Medicine

## 2013-12-29 ENCOUNTER — Telehealth: Payer: Self-pay | Admitting: *Deleted

## 2013-12-29 NOTE — Telephone Encounter (Signed)
Attempted to reach patient via telephone, bad connection, will compose & send letter regarding [facility] and colorectal ca screening.

## 2014-01-13 ENCOUNTER — Encounter: Payer: Self-pay | Admitting: *Deleted

## 2014-01-16 ENCOUNTER — Other Ambulatory Visit: Payer: Self-pay | Admitting: Physician Assistant

## 2014-01-23 ENCOUNTER — Telehealth: Payer: Self-pay | Admitting: *Deleted

## 2014-01-23 NOTE — Telephone Encounter (Signed)
Phoned patient to schedule wellness exam and he stated that a "nurse from [facility]" had gone out to his home and completed an assessment recently (within past month).  Stated that he would call back & schedule, but was not interested in scheduling anything at this time.  Denied having a personal PCP-just uses [facility] providers.

## 2014-02-01 ENCOUNTER — Other Ambulatory Visit: Payer: Self-pay | Admitting: Family Medicine

## 2014-03-01 ENCOUNTER — Other Ambulatory Visit: Payer: Self-pay | Admitting: Physician Assistant

## 2014-03-09 ENCOUNTER — Ambulatory Visit (INDEPENDENT_AMBULATORY_CARE_PROVIDER_SITE_OTHER): Payer: No Typology Code available for payment source | Admitting: Emergency Medicine

## 2014-03-09 VITALS — BP 120/74 | HR 68 | Temp 97.6°F | Resp 18 | Ht 69.5 in | Wt 181.0 lb

## 2014-03-09 DIAGNOSIS — I10 Essential (primary) hypertension: Secondary | ICD-10-CM

## 2014-03-09 DIAGNOSIS — E7800 Pure hypercholesterolemia, unspecified: Secondary | ICD-10-CM

## 2014-03-09 NOTE — Patient Instructions (Signed)

## 2014-03-09 NOTE — Progress Notes (Signed)
[facility] [address] [place] [address] [phone]  Date:  03/09/2014   Name:  Jesse Bell   DOB:  1961/04/05   MRN:  [id number]  PCP:  No primary care provider on file.    Chief Complaint: Medication Refill   History of Present Illness:  Jesse Bell is a 53 y.o. very pleasant male patient who presents with the following:  History of hypertension and HLD.  Needs refills and lab work performed. Under care of ortho for chronic pain medications.   Concerned about increasing nasal drainage and congestion. No improvement with over the counter medications or other home remedies.  Denies other complaint or health concern today.   Patient Active Problem List   Diagnosis Date Noted  . Hypertension 05/03/2012  . Back pain 05/03/2012    Past Medical History  Diagnosis Date  . Back pain   . Hypertension     Past Surgical History  Procedure Laterality Date  . Joint replacement    . Fracture surgery      History  Substance Use Topics  . Smoking status: Never Smoker   . Smokeless tobacco: Never Used  . Alcohol Use: No    History reviewed. No pertinent family history.  No Known Allergies  Medication list has been reviewed and updated.  Current Outpatient Prescriptions on File Prior to Visit  Medication Sig Dispense Refill  . etodolac (LODINE) 200 MG capsule Take 200 mg by mouth every 8 (eight) hours.    Marland Kitchen HYDROcodone-acetaminophen (NORCO) 10-325 MG per tablet Take 1 tablet by mouth every 6 (six) hours as needed.    Marland Kitchen lisinopril (PRINIVIL,ZESTRIL) 10 MG tablet Take one daily for blood pressure 90 tablet 1  . metoprolol succinate (TOPROL-XL) 50 MG 24 hr tablet Take 1 tablet (50 mg total) by mouth daily. NO MORE REFILLS WITHOUT OFFICE VISIT - 2ND NOTICE 15 tablet 0  . pravastatin (PRAVACHOL) 20 MG tablet Take 1 tablet (20 mg total) by mouth daily. NEED OFFICE VISIT FOR FURTHER REFILLS 30 tablet 0  . amoxicillin (AMOXIL) 875 MG tablet Take 1  tablet (875 mg total) by mouth 2 (two) times daily. (Patient not taking: Reported on 03/09/2014) 20 tablet 0  . oxymorphone (OPANA ER) 5 MG 12 hr tablet Take 5 mg by mouth every 12 (twelve) hours.     No current facility-administered medications on file prior to visit.    Review of Systems:  As per HPI, otherwise negative.    Physical Examination: Filed Vitals:   03/09/14 1452  BP: 120/74  Pulse: 68  Temp: 97.6 F (36.4 C)  Resp: 18   Filed Vitals:   03/09/14 1452  Height: 5' 9.5" (1.765 m)  Weight: 181 lb (82.101 kg)   Body mass index is 26.35 kg/(m^2). Ideal Body Weight: Weight in (lb) to have BMI = 25: 171.4  GEN: WDWN, NAD, Non-toxic, A & O x 3 HEENT: Atraumatic, Normocephalic. Neck supple. No masses, No LAD. Ears and Nose: No external deformity. CV: RRR, No M/G/R. No JVD. No thrill. No extra heart sounds. PULM: CTA B, no wheezes, crackles, rhonchi. No retractions. No resp. distress. No accessory muscle use. ABD: S, NT, ND, +BS. No rebound. No HSM. EXTR: No c/c/e NEURO assisted antalgic gait.  PSYCH: Normally interactive. Conversant. Not depressed or anxious appearing.  Calm demeanor.    Assessment and Plan: Hypertension HLD Labs as future  Signed,  Ellison Carwin, MD

## 2014-03-11 ENCOUNTER — Other Ambulatory Visit (INDEPENDENT_AMBULATORY_CARE_PROVIDER_SITE_OTHER): Payer: No Typology Code available for payment source | Admitting: *Deleted

## 2014-03-11 DIAGNOSIS — E7800 Pure hypercholesterolemia, unspecified: Secondary | ICD-10-CM

## 2014-03-11 DIAGNOSIS — I10 Essential (primary) hypertension: Secondary | ICD-10-CM

## 2014-03-11 LAB — POCT URINALYSIS DIPSTICK
Blood, UA: NEGATIVE
GLUCOSE UA: NEGATIVE
Ketones, UA: NEGATIVE
LEUKOCYTES UA: NEGATIVE
NITRITE UA: NEGATIVE
Protein, UA: NEGATIVE
Spec Grav, UA: 1.015
Urobilinogen, UA: 0.2
pH, UA: 6.5

## 2014-03-11 LAB — LIPID PANEL
Cholesterol: 291 mg/dL — ABNORMAL HIGH (ref 0–200)
HDL: 51 mg/dL (ref >39)
LDL CALC: 184 mg/dL — AB (ref 0–99)
Total CHOL/HDL Ratio: 5.7 Ratio
Triglycerides: 282 mg/dL — ABNORMAL HIGH (ref <150)
VLDL: 56 mg/dL — ABNORMAL HIGH (ref 0–40)

## 2014-03-11 LAB — COMPREHENSIVE METABOLIC PANEL
ALBUMIN: 4.4 g/dL (ref 3.5–5.2)
ALT: 9 U/L (ref 0–53)
AST: 21 U/L (ref 0–37)
Alkaline Phosphatase: 50 U/L (ref 39–117)
BUN: 15 mg/dL (ref 6–23)
CO2: 22 mEq/L (ref 19–32)
Calcium: 9.3 mg/dL (ref 8.4–10.5)
Chloride: 102 mEq/L (ref 96–112)
Creat: 0.76 mg/dL (ref 0.50–1.35)
Glucose, Bld: 96 mg/dL (ref 70–99)
POTASSIUM: 4.4 meq/L (ref 3.5–5.3)
Sodium: 136 mEq/L (ref 135–145)
Total Bilirubin: 0.4 mg/dL (ref 0.2–1.2)
Total Protein: 7.2 g/dL (ref 6.0–8.3)

## 2014-03-11 LAB — POCT CBC
Granulocyte percent: 65.4 %G (ref 37–80)
HCT, POC: 38.7 % — AB (ref 43.5–53.7)
HEMOGLOBIN: 12.7 g/dL — AB (ref 14.1–18.1)
LYMPH, POC: 2.6 (ref 0.6–3.4)
MCH, POC: 28.5 pg (ref 27–31.2)
MCHC: 32.8 g/dL (ref 31.8–35.4)
MCV: 86.8 fL (ref 80–97)
MID (CBC): 0.1 (ref 0–0.9)
MPV: 7.9 fL (ref 0–99.8)
POC Granulocyte: 5.1 (ref 2–6.9)
POC LYMPH PERCENT: 33.4 %L (ref 10–50)
POC MID %: 1.2 % (ref 0–12)
Platelet Count, POC: 408 10*3/uL (ref 142–424)
RBC: 4.46 M/uL — AB (ref 4.69–6.13)
RDW, POC: 13.7 %
WBC: 7.8 10*3/uL (ref 4.6–10.2)

## 2014-03-11 NOTE — Progress Notes (Signed)
Pt here for lab draw only  

## 2014-03-12 ENCOUNTER — Other Ambulatory Visit: Payer: Self-pay | Admitting: Emergency Medicine

## 2014-03-12 DIAGNOSIS — I10 Essential (primary) hypertension: Secondary | ICD-10-CM

## 2014-03-12 LAB — PSA: PSA: 0.21 ng/mL (ref <=4.00)

## 2014-03-12 MED ORDER — LISINOPRIL 10 MG PO TABS: ORAL_TABLET | ORAL | Status: DC

## 2014-03-12 MED ORDER — PRAVASTATIN SODIUM 40 MG PO TABS: 40.0000 mg | ORAL_TABLET | Freq: Every day | ORAL | Status: DC

## 2014-03-12 MED ORDER — METOPROLOL SUCCINATE ER 50 MG PO TB24: 50.0000 mg | ORAL_TABLET | Freq: Every day | ORAL | Status: DC

## 2014-07-04 ENCOUNTER — Ambulatory Visit (INDEPENDENT_AMBULATORY_CARE_PROVIDER_SITE_OTHER): Payer: Medicare Other | Admitting: Family Medicine

## 2014-07-04 VITALS — BP 120/82 | HR 87 | Temp 98.7°F | Resp 20 | Ht 69.5 in | Wt 189.1 lb

## 2014-07-04 DIAGNOSIS — J209 Acute bronchitis, unspecified: Secondary | ICD-10-CM

## 2014-07-04 DIAGNOSIS — Z8709 Personal history of other diseases of the respiratory system: Secondary | ICD-10-CM | POA: Diagnosis not present

## 2014-07-04 DIAGNOSIS — R0600 Dyspnea, unspecified: Secondary | ICD-10-CM | POA: Diagnosis not present

## 2014-07-04 LAB — POCT CBC
Granulocyte percent: 69.5 %G (ref 37–80)
HCT, POC: 42.4 % — AB (ref 43.5–53.7)
Hemoglobin: 13.2 g/dL — AB (ref 14.1–18.1)
Lymph, poc: 2.9 (ref 0.6–3.4)
MCH, POC: 27.5 pg (ref 27–31.2)
MCHC: 31.1 g/dL — AB (ref 31.8–35.4)
MCV: 88.4 fL (ref 80–97)
MID (cbc): 0.7 (ref 0–0.9)
MPV: 7.9 fL (ref 0–99.8)
POC Granulocyte: 8.2 — AB (ref 2–6.9)
POC LYMPH PERCENT: 24.7 %L (ref 10–50)
POC MID %: 5.8 %M (ref 0–12)
Platelet Count, POC: 466 10*3/uL — AB (ref 142–424)
RBC: 4.8 M/uL (ref 4.69–6.13)
RDW, POC: 14.3 %
WBC: 11.8 10*3/uL — AB (ref 4.6–10.2)

## 2014-07-04 MED ORDER — PREDNISONE 20 MG PO TABS: 40.0000 mg | ORAL_TABLET | Freq: Every day | ORAL | Status: DC

## 2014-07-04 MED ORDER — ALBUTEROL SULFATE HFA 108 (90 BASE) MCG/ACT IN AERS: 2.0000 | INHALATION_SPRAY | Freq: Four times a day (QID) | RESPIRATORY_TRACT | Status: DC | PRN

## 2014-07-04 MED ORDER — ALBUTEROL SULFATE (2.5 MG/3ML) 0.083% IN NEBU
2.5000 mg | INHALATION_SOLUTION | Freq: Once | RESPIRATORY_TRACT | Status: AC
  Administered 2014-07-04: 2.5 mg via RESPIRATORY_TRACT

## 2014-07-04 MED ORDER — AZITHROMYCIN 250 MG PO TABS: ORAL_TABLET | ORAL | Status: DC

## 2014-07-04 NOTE — Patient Instructions (Signed)
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
## 2014-07-10 ENCOUNTER — Telehealth: Payer: Self-pay

## 2014-07-10 DIAGNOSIS — R059 Cough, unspecified: Secondary | ICD-10-CM

## 2014-07-10 DIAGNOSIS — R05 Cough: Secondary | ICD-10-CM

## 2014-07-10 MED ORDER — BENZONATATE 200 MG PO CAPS
200.0000 mg | ORAL_CAPSULE | Freq: Three times a day (TID) | ORAL | Status: DC | PRN
Start: 2014-07-10 — End: 2015-04-02

## 2014-07-10 NOTE — Telephone Encounter (Signed)
Dr Joseph Art: can we call in something for his persistent cough?

## 2014-07-10 NOTE — Telephone Encounter (Signed)
Coughing, an coughing and coughing  [facility] - on [address]

## 2014-07-20 ENCOUNTER — Other Ambulatory Visit: Payer: Self-pay | Admitting: Emergency Medicine

## 2014-08-18 ENCOUNTER — Other Ambulatory Visit: Payer: Self-pay | Admitting: Emergency Medicine

## 2014-08-26 ENCOUNTER — Other Ambulatory Visit: Payer: Self-pay | Admitting: Emergency Medicine

## 2014-09-02 ENCOUNTER — Other Ambulatory Visit: Payer: Self-pay | Admitting: Emergency Medicine

## 2014-09-17 ENCOUNTER — Other Ambulatory Visit: Payer: Self-pay | Admitting: Emergency Medicine

## 2014-09-25 ENCOUNTER — Other Ambulatory Visit: Payer: Self-pay | Admitting: Physician Assistant

## 2014-11-06 ENCOUNTER — Other Ambulatory Visit: Payer: Self-pay | Admitting: Physician Assistant

## 2014-12-05 ENCOUNTER — Other Ambulatory Visit: Payer: Self-pay | Admitting: Emergency Medicine

## 2014-12-09 ENCOUNTER — Other Ambulatory Visit: Payer: Self-pay | Admitting: Physician Assistant

## 2014-12-11 ENCOUNTER — Other Ambulatory Visit: Payer: Self-pay | Admitting: Orthopedic Surgery

## 2014-12-11 DIAGNOSIS — M5136 Other intervertebral disc degeneration, lumbar region: Secondary | ICD-10-CM

## 2014-12-15 ENCOUNTER — Other Ambulatory Visit: Payer: Medicare Other

## 2014-12-17 ENCOUNTER — Ambulatory Visit
Admission: RE | Admit: 2014-12-17 | Discharge: 2014-12-17 | Disposition: A | Discharge status: Home / Self Care | Payer: Medicare Other | Source: Ambulatory Visit | Attending: Orthopedic Surgery | Admitting: Orthopedic Surgery

## 2014-12-17 DIAGNOSIS — M5136 Other intervertebral disc degeneration, lumbar region: Secondary | ICD-10-CM

## 2014-12-20 ENCOUNTER — Other Ambulatory Visit: Payer: Self-pay | Admitting: Family Medicine

## 2015-01-26 ENCOUNTER — Other Ambulatory Visit: Payer: Self-pay | Admitting: Orthopedic Surgery

## 2015-01-26 DIAGNOSIS — M5136 Other intervertebral disc degeneration, lumbar region: Secondary | ICD-10-CM

## 2015-01-27 ENCOUNTER — Ambulatory Visit (INDEPENDENT_AMBULATORY_CARE_PROVIDER_SITE_OTHER): Payer: Medicare Other | Admitting: Emergency Medicine

## 2015-01-27 ENCOUNTER — Encounter: Payer: Self-pay | Admitting: Emergency Medicine

## 2015-01-27 VITALS — BP 122/80 | HR 76 | Temp 97.9°F | Resp 16 | Ht 70.0 in | Wt 193.0 lb

## 2015-01-27 DIAGNOSIS — E782 Mixed hyperlipidemia: Secondary | ICD-10-CM | POA: Insufficient documentation

## 2015-01-27 DIAGNOSIS — I10 Essential (primary) hypertension: Secondary | ICD-10-CM | POA: Diagnosis not present

## 2015-01-27 LAB — COMPREHENSIVE METABOLIC PANEL
ALT: 11 U/L (ref 9–46)
AST: 22 U/L (ref 10–35)
Albumin: 4 g/dL (ref 3.6–5.1)
Alkaline Phosphatase: 43 U/L (ref 40–115)
BILIRUBIN TOTAL: 0.4 mg/dL (ref 0.2–1.2)
BUN: 15 mg/dL (ref 7–25)
CALCIUM: 8.8 mg/dL (ref 8.6–10.3)
CHLORIDE: 101 mmol/L (ref 98–110)
CO2: 26 mmol/L (ref 20–31)
CREATININE: 0.77 mg/dL (ref 0.70–1.33)
GLUCOSE: 82 mg/dL (ref 65–99)
Potassium: 4.6 mmol/L (ref 3.5–5.3)
Sodium: 135 mmol/L (ref 135–146)
TOTAL PROTEIN: 6.5 g/dL (ref 6.1–8.1)

## 2015-01-27 LAB — LIPID PANEL
CHOL/HDL RATIO: 5.6 ratio — AB (ref <=5.0)
CHOLESTEROL: 236 mg/dL — AB (ref 125–200)
HDL: 42 mg/dL (ref >=40)
LDL Cholesterol: 127 mg/dL (ref <130)
TRIGLYCERIDES: 334 mg/dL — AB (ref <150)
VLDL: 67 mg/dL — AB (ref <30)

## 2015-01-27 LAB — CBC
HEMATOCRIT: 39.8 % (ref 39.0–52.0)
Hemoglobin: 13.3 g/dL (ref 13.0–17.0)
MCH: 28.1 pg (ref 26.0–34.0)
MCHC: 33.4 g/dL (ref 30.0–36.0)
MCV: 84.1 fL (ref 78.0–100.0)
MPV: 10.3 fL (ref 8.6–12.4)
Platelets: 458 10*3/uL — ABNORMAL HIGH (ref 150–400)
RBC: 4.73 MIL/uL (ref 4.22–5.81)
RDW: 13.3 % (ref 11.5–15.5)
WBC: 6.9 10*3/uL (ref 4.0–10.5)

## 2015-01-27 MED ORDER — LISINOPRIL 10 MG PO TABS: ORAL_TABLET | ORAL | Status: DC

## 2015-01-27 MED ORDER — PRAVASTATIN SODIUM 40 MG PO TABS: ORAL_TABLET | ORAL | Status: DC

## 2015-01-27 MED ORDER — METOPROLOL SUCCINATE ER 50 MG PO TB24: ORAL_TABLET | ORAL | Status: DC

## 2015-01-27 NOTE — Progress Notes (Signed)
Subjective:  Patient ID: Jesse Bell, male    DOB: [birth year]  Age: 54 y.o. MRN: [id number]  CC: Medication Refill   HPI Jesse Bell presents  needing refills on his medication. He is running out of his antihypertensive lipid drugs. He last had [place] which shows 3 hours ago. He is tolerating his medication with no adverse effect  History Jesse Bell has a past medical history of Back pain and Hypertension.   He has past surgical history that includes Joint replacement and Fracture surgery.   His  family history is not on file.  He   reports that he has never smoked. He has never used smokeless tobacco. He reports that he does not drink alcohol. His drug history is not on file.  Outpatient Prescriptions Prior to Visit  Medication Sig Dispense Refill  . HYDROcodone-acetaminophen (NORCO) 10-325 MG per tablet Take 1 tablet by mouth every 6 (six) hours as needed.    Marland Kitchen oxymorphone (OPANA ER) 5 MG 12 hr tablet Take 5 mg by mouth every 12 (twelve) hours.    Marland Kitchen lisinopril (PRINIVIL,ZESTRIL) 10 MG tablet TAKE 1 TABLET BY MOUTH EVERY DAY FOR BLOOD PRESSURE "OV NEEDED FOR REFILLS" 30 tablet 0  . metoprolol succinate (TOPROL-XL) 50 MG 24 hr tablet TAKE 1 TABLET BY MOUTH EVERY DAY.  "NO MORE REFILLS WITHOUT OV" 90 tablet 0  . pravastatin (PRAVACHOL) 40 MG tablet TAKE 1 TABLET BY MOUTH EVERY DAY,  "NO MORE REFILLS WITHOUT OV" 15 tablet 0  . albuterol (PROVENTIL HFA;VENTOLIN HFA) 108 (90 BASE) MCG/ACT inhaler Inhale 2 puffs into the lungs every 6 (six) hours as needed for wheezing or shortness of breath. (Patient not taking: Reported on 01/27/2015) 1 Inhaler 0  . azithromycin (ZITHROMAX Z-PAK) 250 MG tablet Take as directed on pack (Patient not taking: Reported on 01/27/2015) 6 tablet 0  . benzonatate (TESSALON) 200 MG capsule Take 1 capsule (200 mg total) by mouth 3 (three) times daily as needed for cough. (Patient not taking: Reported on 01/27/2015) 20 capsule 0  . DEXAMETHASONE PO Take 0.5 tablets by  mouth as needed.    . etodolac (LODINE) 200 MG capsule Take 200 mg by mouth every 8 (eight) hours.    . predniSONE (DELTASONE) 20 MG tablet Take 2 tablets (40 mg total) by mouth daily. (Patient not taking: Reported on 01/27/2015) 10 tablet 0   No facility-administered medications prior to visit.    Social History   Social History  . Marital Status: Single    Spouse Name: N/A  . Number of Children: N/A  . Years of Education: N/A   Social History Main Topics  . Smoking status: Never Smoker   . Smokeless tobacco: Never Used  . Alcohol Use: No  . Drug Use: None  . Sexual Activity: Not Asked   Other Topics Concern  . None   Social History Narrative     Review of Systems  Constitutional: Negative for fever, chills and appetite change.  HENT: Negative for congestion, ear pain, postnasal drip, sinus pressure and sore throat.   Eyes: Negative for pain and redness.  Respiratory: Negative for cough, shortness of breath and wheezing.   Cardiovascular: Negative for leg swelling.  Gastrointestinal: Negative for nausea, vomiting, abdominal pain, diarrhea, constipation and blood in stool.  Endocrine: Negative for polyuria.  Genitourinary: Negative for dysuria, urgency, frequency and flank pain.  Musculoskeletal: Negative for gait problem.  Skin: Negative for rash.  Neurological: Negative for weakness and headaches.  Psychiatric/Behavioral:  Negative for confusion and decreased concentration. The patient is not nervous/anxious.     Objective:  BP 122/80 mmHg  Pulse 76  Temp(Src) 97.9 F (36.6 C) (Oral)  Resp 16  Ht 5\' 10"  (1.778 m)  Wt 193 lb (87.544 kg)  BMI 27.69 kg/m2  SpO2 98%  Physical Exam  Constitutional: He is oriented to person, place, and time. He appears well-developed and well-nourished. No distress.  HENT:  Head: Normocephalic and atraumatic.  Right Ear: External ear normal.  Left Ear: External ear normal.  Nose: Nose normal.  Eyes: Conjunctivae and EOM are  normal. Pupils are equal, round, and reactive to light. No scleral icterus.  Neck: Normal range of motion. Neck supple. No tracheal deviation present.  Cardiovascular: Normal rate, regular rhythm and normal heart sounds.   Pulmonary/Chest: Effort normal. No respiratory distress. He has no wheezes. He has no rales.  Abdominal: He exhibits no mass. There is no tenderness. There is no rebound and no guarding.  Musculoskeletal: He exhibits no edema.  Lymphadenopathy:    He has no cervical adenopathy.  Neurological: He is alert and oriented to person, place, and time. Coordination normal.  Skin: Skin is warm and dry. No rash noted.  Psychiatric: He has a normal mood and affect. His behavior is normal.      Assessment & Plan:   Jesse Bell was seen today for medication refill.  Diagnoses and all orders for this visit:  Essential hypertension, benign -     Comprehensive metabolic panel -     CBC -     Lipid panel -     PSA -     Ambulatory referral to Gastroenterology  Mixed hyperlipidemia -     Comprehensive metabolic panel -     CBC -     Lipid panel -     PSA -     Ambulatory referral to Gastroenterology  Other orders -     metoprolol succinate (TOPROL-XL) 50 MG 24 hr tablet; TAKE 1 TABLET BY MOUTH EVERY DAY. -     lisinopril (PRINIVIL,ZESTRIL) 10 MG tablet; TAKE 1 TABLET BY MOUTH EVERY DAY FOR BLOOD PRESSURE " -     pravastatin (PRAVACHOL) 40 MG tablet; TAKE 1 TABLET BY MOUTH EVERY DAY,  "   I have changed Jesse Bell's metoprolol succinate, lisinopril, and pravastatin. I am also having him maintain his etodolac, oxymorphone, HYDROcodone-acetaminophen, DEXAMETHASONE PO, azithromycin, predniSONE, albuterol, and benzonatate.  Meds ordered this encounter  Medications  . metoprolol succinate (TOPROL-XL) 50 MG 24 hr tablet    Sig: TAKE 1 TABLET BY MOUTH EVERY DAY.    Dispense:  90 tablet    Refill:  1  . lisinopril (PRINIVIL,ZESTRIL) 10 MG tablet    Sig: TAKE 1 TABLET BY MOUTH EVERY  DAY FOR BLOOD PRESSURE "    Dispense:  90 tablet    Refill:  1  . pravastatin (PRAVACHOL) 40 MG tablet    Sig: TAKE 1 TABLET BY MOUTH EVERY DAY,  "    Dispense:  90 tablet    Refill:  1    Appropriate red flag conditions were discussed with the patient as well as actions that should be taken.  Patient expressed his understanding.  Follow-up: Return in about 3 months (around 04/29/2015).  Roselee Culver, MD

## 2015-01-27 NOTE — Patient Instructions (Signed)
Hypertension Hypertension, commonly called high blood pressure, is when the force of blood pumping through your arteries is too strong. Your arteries are the blood vessels that carry blood from your heart throughout your body. A blood pressure reading consists of a higher number over a lower number, such as 110/72. The higher number (systolic) is the pressure inside your arteries when your heart pumps. The lower number (diastolic) is the pressure inside your arteries when your heart relaxes. Ideally you want your blood pressure below 120/80. Hypertension forces your heart to work harder to pump blood. Your arteries may become narrow or stiff. Having untreated or uncontrolled hypertension can cause heart attack, stroke, kidney disease, and other problems. RISK FACTORS Some risk factors for high blood pressure are controllable. Others are not.  Risk factors you cannot control include:   Race. You may be at higher risk if you are African American.  Age. Risk increases with age.  Gender. Men are at higher risk than women before age 45 years. After age 65, women are at higher risk than men. Risk factors you can control include:  Not getting enough exercise or physical activity.  Being overweight.  Getting too much fat, sugar, calories, or salt in your diet.  Drinking too much alcohol. SIGNS AND SYMPTOMS Hypertension does not usually cause signs or symptoms. Extremely high blood pressure (hypertensive crisis) may cause headache, anxiety, shortness of breath, and nosebleed. DIAGNOSIS To check if you have hypertension, your health care provider will measure your blood pressure while you are seated, with your arm held at the level of your heart. It should be measured at least twice using the same arm. Certain conditions can cause a difference in blood pressure between your right and left arms. A blood pressure reading that is higher than normal on one occasion does not mean that you need treatment. If  it is not clear whether you have high blood pressure, you may be asked to return on a different day to have your blood pressure checked again. Or, you may be asked to monitor your blood pressure at home for 1 or more weeks. TREATMENT Treating high blood pressure includes making lifestyle changes and possibly taking medicine. Living a healthy lifestyle can help lower high blood pressure. You may need to change some of your habits. Lifestyle changes may include:  Following the DASH diet. This diet is high in fruits, vegetables, and whole grains. It is low in salt, red meat, and added sugars.  Keep your sodium intake below 2,300 mg per day.  Getting at least 30-45 minutes of aerobic exercise at least 4 times per week.  Losing weight if necessary.  Not smoking.  Limiting alcoholic beverages.  Learning ways to reduce stress. Your health care provider may prescribe medicine if lifestyle changes are not enough to get your blood pressure under control, and if one of the following is true:  You are 18-59 years of age and your systolic blood pressure is above 140.  You are 60 years of age or older, and your systolic blood pressure is above 150.  Your diastolic blood pressure is above 90.  You have diabetes, and your systolic blood pressure is over 140 or your diastolic blood pressure is over 90.  You have kidney disease and your blood pressure is above 140/90.  You have heart disease and your blood pressure is above 140/90. Your personal target blood pressure may vary depending on your medical conditions, your age, and other factors. HOME CARE INSTRUCTIONS    Have your blood pressure rechecked as directed by your health care provider.   Take medicines only as directed by your health care provider. Follow the directions carefully. Blood pressure medicines must be taken as prescribed. The medicine does not work as well when you skip doses. Skipping doses also puts you at risk for  problems.  Do not smoke.   Monitor your blood pressure at home as directed by your health care provider. SEEK MEDICAL CARE IF:   You think you are having a reaction to medicines taken.  You have recurrent headaches or feel dizzy.  You have swelling in your ankles.  You have trouble with your vision. SEEK IMMEDIATE MEDICAL CARE IF:  You develop a severe headache or confusion.  You have unusual weakness, numbness, or feel faint.  You have severe chest or abdominal pain.  You vomit repeatedly.  You have trouble breathing. MAKE SURE YOU:   Understand these instructions.  Will watch your condition.  Will get help right away if you are not doing well or get worse.   This information is not intended to replace advice given to you by your health care provider. Make sure you discuss any questions you have with your health care provider.   Document Released: 03/27/2005 Document Revised: 08/11/2014 Document Reviewed: 01/17/2013 Elsevier Interactive Patient Education 2016 [employer]. Hypertension Hypertension, commonly called high blood pressure, is when the force of blood pumping through your arteries is too strong. Your arteries are the blood vessels that carry blood from your heart throughout your body. A blood pressure reading consists of a higher number over a lower number, such as 110/72. The higher number (systolic) is the pressure inside your arteries when your heart pumps. The lower number (diastolic) is the pressure inside your arteries when your heart relaxes. Ideally you want your blood pressure below 120/80. Hypertension forces your heart to work harder to pump blood. Your arteries may become narrow or stiff. Having untreated or uncontrolled hypertension can cause heart attack, stroke, kidney disease, and other problems. RISK FACTORS Some risk factors for high blood pressure are controllable. Others are not.  Risk factors you cannot control include:   Race. You may  be at higher risk if you are African American.  Age. Risk increases with age.  Gender. Men are at higher risk than women before age 18 years. After age 34, women are at higher risk than men. Risk factors you can control include:  Not getting enough exercise or physical activity.  Being overweight.  Getting too much fat, sugar, calories, or salt in your diet.  Drinking too much alcohol. SIGNS AND SYMPTOMS Hypertension does not usually cause signs or symptoms. Extremely high blood pressure (hypertensive crisis) may cause headache, anxiety, shortness of breath, and nosebleed. DIAGNOSIS To check if you have hypertension, your health care provider will measure your blood pressure while you are seated, with your arm held at the level of your heart. It should be measured at least twice using the same arm. Certain conditions can cause a difference in blood pressure between your right and left arms. A blood pressure reading that is higher than normal on one occasion does not mean that you need treatment. If it is not clear whether you have high blood pressure, you may be asked to return on a different day to have your blood pressure checked again. Or, you may be asked to monitor your blood pressure at home for 1 or more weeks. TREATMENT Treating high blood pressure includes  making lifestyle changes and possibly taking medicine. Living a healthy lifestyle can help lower high blood pressure. You may need to change some of your habits. Lifestyle changes may include:  Following the DASH diet. This diet is high in fruits, vegetables, and whole grains. It is low in salt, red meat, and added sugars.  Keep your sodium intake below 2,300 mg per day.  Getting at least 30-45 minutes of aerobic exercise at least 4 times per week.  Losing weight if necessary.  Not smoking.  Limiting alcoholic beverages.  Learning ways to reduce stress. Your health care provider may prescribe medicine if lifestyle  changes are not enough to get your blood pressure under control, and if one of the following is true:  You are 52-47 years of age and your systolic blood pressure is above 140.  You are 66 years of age or older, and your systolic blood pressure is above 150.  Your diastolic blood pressure is above 90.  You have diabetes, and your systolic blood pressure is over 161 or your diastolic blood pressure is over 90.  You have kidney disease and your blood pressure is above 140/90.  You have heart disease and your blood pressure is above 140/90. Your personal target blood pressure may vary depending on your medical conditions, your age, and other factors. HOME CARE INSTRUCTIONS  Have your blood pressure rechecked as directed by your health care provider.   Take medicines only as directed by your health care provider. Follow the directions carefully. Blood pressure medicines must be taken as prescribed. The medicine does not work as well when you skip doses. Skipping doses also puts you at risk for problems.  Do not smoke.   Monitor your blood pressure at home as directed by your health care provider. SEEK MEDICAL CARE IF:   You think you are having a reaction to medicines taken.  You have recurrent headaches or feel dizzy.  You have swelling in your ankles.  You have trouble with your vision. SEEK IMMEDIATE MEDICAL CARE IF:  You develop a severe headache or confusion.  You have unusual weakness, numbness, or feel faint.  You have severe chest or abdominal pain.  You vomit repeatedly.  You have trouble breathing. MAKE SURE YOU:   Understand these instructions.  Will watch your condition.  Will get help right away if you are not doing well or get worse.   This information is not intended to replace advice given to you by your health care provider. Make sure you discuss any questions you have with your health care provider.   Document Released: 03/27/2005 Document  Revised: 08/11/2014 Document Reviewed: 01/17/2013 Elsevier Interactive Patient Education [employer].

## 2015-01-28 ENCOUNTER — Other Ambulatory Visit: Payer: Self-pay | Admitting: Emergency Medicine

## 2015-01-28 ENCOUNTER — Ambulatory Visit
Admission: RE | Admit: 2015-01-28 | Discharge: 2015-01-28 | Disposition: A | Discharge status: Home / Self Care | Payer: Medicare Other | Source: Ambulatory Visit | Attending: Orthopedic Surgery | Admitting: Orthopedic Surgery

## 2015-01-28 DIAGNOSIS — M5136 Other intervertebral disc degeneration, lumbar region: Secondary | ICD-10-CM

## 2015-01-28 LAB — PSA: PSA: 0.18 ng/mL (ref <=4.00)

## 2015-01-28 MED ORDER — METHYLPREDNISOLONE ACETATE 40 MG/ML INJ SUSP (RADIOLOG
120.0000 mg | Freq: Once | INTRAMUSCULAR | Status: AC
  Administered 2015-01-28: 120 mg via EPIDURAL

## 2015-01-28 MED ORDER — IOHEXOL 180 MG/ML  SOLN
1.0000 mL | Freq: Once | INTRAMUSCULAR | Status: DC | PRN
  Administered 2015-01-28: 1 mL via EPIDURAL

## 2015-01-28 MED ORDER — PRAVASTATIN SODIUM 80 MG PO TABS: ORAL_TABLET | ORAL | Status: DC

## 2015-01-28 NOTE — Discharge Instructions (Signed)

## 2015-03-25 DIAGNOSIS — M5416 Radiculopathy, lumbar region: Secondary | ICD-10-CM | POA: Insufficient documentation

## 2015-03-30 ENCOUNTER — Other Ambulatory Visit (HOSPITAL_COMMUNITY): Payer: Self-pay | Admitting: Neurosurgery

## 2015-03-30 DIAGNOSIS — M5416 Radiculopathy, lumbar region: Secondary | ICD-10-CM

## 2015-04-14 ENCOUNTER — Ambulatory Visit (HOSPITAL_COMMUNITY)
Admission: RE | Admit: 2015-04-14 | Discharge: 2015-04-14 | Disposition: A | Discharge status: Home / Self Care | Payer: Medicare Other | Source: Ambulatory Visit | Attending: Neurosurgery | Admitting: Neurosurgery

## 2015-04-14 DIAGNOSIS — M5416 Radiculopathy, lumbar region: Secondary | ICD-10-CM | POA: Diagnosis not present

## 2015-04-14 DIAGNOSIS — E882 Lipomatosis, not elsewhere classified: Secondary | ICD-10-CM | POA: Insufficient documentation

## 2015-04-14 MED ORDER — IOHEXOL 180 MG/ML  SOLN
20.0000 mL | Freq: Once | INTRAMUSCULAR | Status: AC | PRN
  Administered 2015-04-14: 16 mL via INTRATHECAL

## 2015-04-14 MED ORDER — DIAZEPAM 5 MG PO TABS
5.0000 mg | ORAL_TABLET | Freq: Once | ORAL | Status: AC
  Administered 2015-04-14: 5 mg via ORAL
  Filled 2015-04-14: qty 1

## 2015-04-14 MED ORDER — DIAZEPAM 5 MG PO TABS
ORAL_TABLET | ORAL | Status: AC
  Filled 2015-04-14: qty 1

## 2015-04-14 MED ORDER — OXYCODONE HCL 5 MG PO TABS
5.0000 mg | ORAL_TABLET | ORAL | Status: DC | PRN
  Administered 2015-04-14: 5 mg via ORAL

## 2015-04-14 MED ORDER — LIDOCAINE HCL (PF) 1 % IJ SOLN
INTRAMUSCULAR | Status: AC
  Administered 2015-04-14: 5 mL via INTRAMUSCULAR
  Filled 2015-04-14: qty 5

## 2015-04-14 MED ORDER — ONDANSETRON HCL 4 MG/2ML IJ SOLN: 4.0000 mg | Freq: Four times a day (QID) | INTRAMUSCULAR | Status: DC | PRN

## 2015-04-14 MED ORDER — OXYCODONE HCL 5 MG PO TABS
ORAL_TABLET | ORAL | Status: AC
  Administered 2015-04-14: 5 mg via ORAL
  Filled 2015-04-14: qty 1

## 2015-04-14 NOTE — Discharge Instructions (Signed)
Myelogram and Lumbar Puncture Discharge Instructions ° °1. Go home and rest quietly for the next 24 hours.  It is important to lie flat for the next 24 hours.  Get up only to go to the restroom.  You may lie in the bed or on a couch on your back, your stomach, your left side or your right side.  You may have one pillow under your head.  You may have pillows between your knees while you are on your side or under your knees while you are on your back. ° °2. DO NOT drive today.  Recline the seat as far back as it will go, while still wearing your seat belt, on the way home. ° °3. You may get up to go to the bathroom as needed.  You may sit up for 10 minutes to eat.  You may resume your normal diet and medications unless otherwise indicated. ° °4. The incidence of headache, nausea, or vomiting is about 5% (one in 20 patients).  If you develop a headache, lie flat and drink plenty of fluids until the headache goes away.  Caffeinated beverages may be helpful.  If you develop severe nausea and vomiting or a headache that does not go away with flat bed rest, call Dr Cabbell. ° °5. You may resume normal activities after your 24 hours of bed rest is over; however, do not exert yourself strongly or do any heavy lifting tomorrow. ° °6. Call your physician for a follow-up appointment.  The results of your myelogram will be sent directly to your physician by the following day. ° °7. If you have any questions or if complications develop after you arrive home, please call Dr Cabbell. ° °Discharge instructions have been explained to the patient.  The patient, or the person responsible for the patient, fully understands these instructions. ° ° °

## 2015-04-14 NOTE — Op Note (Signed)
*   No surgery found * lumbar Myelogram  PATIENT:  Jesse Bell is a 55 y.o. male  PRE-OPERATIVE DIAGNOSIS:  Osteoarthritis with radiculopathy right  POST-OPERATIVE DIAGNOSIS:  same  PROCEDURE:  Lumbar Myelogram  SURGEON:  Cashius Grandstaff  ANESTHESIA:   local LOCAL MEDICATIONS USED:  LIDOCAINE  and Amount: 10 ml Procedure Note: Jesse Bell is a 55 y.o. male whom Was taken to the fluoroscopy suite and  positioned prone on the fluoroscopy table. HIs back was prepared and draped in a sterile manner. I infiltrated 10 cc into the lumbar region. I then introduced a spinal needle into the thecal sac at the L2/3 interlaminar space. I infiltrated 20cc of Omnipaque 180 into the thecal sac. Fluoroscopy showed the needle and contrast in the thecal sac. Jesse Bell tolerated the procedure well. he Will be taken to CT for evaluation.     PATIENT DISPOSITION:  PACU - hemodynamically stable.

## 2015-04-14 NOTE — Progress Notes (Signed)
Pt reports lowe back and bilateral upper leg pain, pt rates pain 8/10. Dr. Christella Noa made aware, Oxycodone 5 mg order given and followed through by this RN.   Babs Bertin RN

## 2015-04-25 ENCOUNTER — Other Ambulatory Visit: Payer: Self-pay | Admitting: Emergency Medicine

## 2015-04-29 DIAGNOSIS — D1779 Benign lipomatous neoplasm of other sites: Secondary | ICD-10-CM | POA: Insufficient documentation

## 2015-06-04 ENCOUNTER — Other Ambulatory Visit: Payer: Self-pay

## 2015-06-04 MED ORDER — PRAVASTATIN SODIUM 80 MG PO TABS: ORAL_TABLET | ORAL | Status: DC

## 2015-08-03 ENCOUNTER — Telehealth: Payer: Self-pay

## 2015-08-03 MED ORDER — LISINOPRIL 10 MG PO TABS: ORAL_TABLET | ORAL | Status: DC

## 2015-08-03 NOTE — Telephone Encounter (Signed)
Patient called in stating he needs his lisinopril (PRINIVIL,ZESTRIL) 10 MG tablet refilled said the [facility] has been trying to get in touch with [place] for over a week now and no one has gotten back with them, I did not see anything in the chart about this. He states that he has been out of this med for 2 weeks now and would like for it to be sent to the [id number] - [place], [place] - [place]. Thank you!

## 2015-08-03 NOTE — Telephone Encounter (Signed)
  Rx sent. Pt notified. I advised him to RTC.

## 2015-08-26 ENCOUNTER — Ambulatory Visit (INDEPENDENT_AMBULATORY_CARE_PROVIDER_SITE_OTHER): Payer: Medicare Other | Admitting: Physician Assistant

## 2015-08-26 VITALS — BP 122/82 | HR 79 | Temp 97.9°F | Resp 18 | Ht 69.0 in

## 2015-08-26 DIAGNOSIS — E785 Hyperlipidemia, unspecified: Secondary | ICD-10-CM

## 2015-08-26 DIAGNOSIS — M25561 Pain in right knee: Secondary | ICD-10-CM

## 2015-08-26 DIAGNOSIS — I10 Essential (primary) hypertension: Secondary | ICD-10-CM

## 2015-08-26 LAB — CBC
HCT: 39 % (ref 38.5–50.0)
Hemoglobin: 12.8 g/dL — ABNORMAL LOW (ref 13.2–17.1)
MCH: 27.8 pg (ref 27.0–33.0)
MCHC: 32.8 g/dL (ref 32.0–36.0)
MCV: 84.6 fL (ref 80.0–100.0)
MPV: 10.6 fL (ref 7.5–12.5)
PLATELETS: 326 10*3/uL (ref 140–400)
RBC: 4.61 MIL/uL (ref 4.20–5.80)
RDW: 13.6 % (ref 11.0–15.0)
WBC: 5.3 10*3/uL (ref 3.8–10.8)

## 2015-08-26 LAB — LIPID PANEL
CHOLESTEROL: 201 mg/dL — AB (ref 125–200)
HDL: 49 mg/dL (ref >=40)
LDL CALC: 132 mg/dL — AB (ref <130)
TRIGLYCERIDES: 102 mg/dL (ref <150)
Total CHOL/HDL Ratio: 4.1 Ratio (ref <=5.0)
VLDL: 20 mg/dL (ref <30)

## 2015-08-26 LAB — COMPLETE METABOLIC PANEL WITH GFR
ALT: 13 U/L (ref 9–46)
AST: 26 U/L (ref 10–35)
Albumin: 4.3 g/dL (ref 3.6–5.1)
Alkaline Phosphatase: 48 U/L (ref 40–115)
BUN: 11 mg/dL (ref 7–25)
CHLORIDE: 103 mmol/L (ref 98–110)
CO2: 25 mmol/L (ref 20–31)
Calcium: 9.1 mg/dL (ref 8.6–10.3)
Creat: 0.8 mg/dL (ref 0.70–1.33)
Glucose, Bld: 98 mg/dL (ref 65–99)
POTASSIUM: 4.5 mmol/L (ref 3.5–5.3)
SODIUM: 137 mmol/L (ref 135–146)
Total Bilirubin: 0.5 mg/dL (ref 0.2–1.2)
Total Protein: 7 g/dL (ref 6.1–8.1)

## 2015-08-26 MED ORDER — TRIAMCINOLONE ACETONIDE 0.1 % EX CREA: 1.0000 "application " | TOPICAL_CREAM | Freq: Two times a day (BID) | CUTANEOUS | Status: DC

## 2015-08-26 MED ORDER — METOPROLOL SUCCINATE ER 50 MG PO TB24: 50.0000 mg | ORAL_TABLET | Freq: Every day | ORAL | Status: DC

## 2015-08-26 MED ORDER — PRAVASTATIN SODIUM 80 MG PO TABS: ORAL_TABLET | ORAL | Status: DC
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
## 2015-08-26 NOTE — Patient Instructions (Addendum)
     IF you received an x-ray today, you will receive an invoice from [facility]. Please contact [facility] at [phone] with questions or concerns regarding your invoice.   IF you received labwork today, you will receive an invoice from [employer]. Please contact [facility] at [phone] with questions or concerns regarding your invoice.   Our billing staff will not be able to assist you with questions regarding bills from these companies.  You will be contacted with the lab results as soon as they are available. The fastest way to get your results is to activate your My Chart account. Instructions are located on the last page of this paperwork. If you have not heard from [place] regarding the results in 2 weeks, please contact this office.    Please await contact for pain clinic. I will have your lab results within 14 days.

## 2015-09-14 ENCOUNTER — Telehealth: Payer: Self-pay | Admitting: *Deleted

## 2015-09-14 ENCOUNTER — Encounter: Payer: Self-pay | Admitting: *Deleted

## 2015-09-14 NOTE — Telephone Encounter (Signed)
Can you please check the status of pt referral to pain clinic?  Pt came in today and asked about it.

## 2015-09-15 NOTE — Telephone Encounter (Signed)
The referral was sent to [facility] for Dr Maryjean Ka in May.  I checked with the facility on 09/14/15 and they had no record of the referral.  I called the patient and told him the situation and that I would resend it, but he said he had seen a pain management provider at the neurosurgeon's office before and did not want to go back to him.  I told him we could send them to the new pain management facility, but I would have to research more on which facility to send him to because of his insurance.  He is also obtaining his medical records from the orthopedist, so we can send them along with his referral.  He is planning to drop them off on Monday, 09/20/15.

## 2015-10-01 NOTE — Telephone Encounter (Signed)
The patient dropped off his medical records on 09/30/15 and his referral was sent to [facility] on 09/30/15 as well.

## 2015-10-27 ENCOUNTER — Other Ambulatory Visit: Payer: Self-pay | Admitting: Physician Assistant

## 2015-11-01 ENCOUNTER — Telehealth: Payer: Self-pay

## 2015-11-01 MED ORDER — LISINOPRIL 10 MG PO TABS: ORAL_TABLET | ORAL | 0 refills | Status: DC

## 2015-11-01 NOTE — Telephone Encounter (Signed)
Patient calling to check on med refill  Request sent by pharmacy on 19 July.  [phone] (H)

## 2015-11-01 NOTE — Telephone Encounter (Signed)
Rx sent in. Pt notified. 

## 2015-11-01 NOTE — Addendum Note (Signed)
Addended by: Jannette Spanner on: 11/01/2015 12:26 PM   Modules accepted: Orders

## 2016-01-04 ENCOUNTER — Ambulatory Visit: Payer: Medicare Other | Attending: Anesthesiology | Admitting: Physical Therapy

## 2016-01-04 ENCOUNTER — Encounter: Payer: Self-pay | Admitting: Physical Therapy

## 2016-01-04 DIAGNOSIS — M545 Low back pain, unspecified: Secondary | ICD-10-CM

## 2016-01-04 DIAGNOSIS — M6281 Muscle weakness (generalized): Secondary | ICD-10-CM | POA: Diagnosis present

## 2016-01-04 NOTE — Patient Instructions (Signed)
Jesse Bell PT [facility] [address], [address] [place], [place] [address] Phone # [phone] Fax [phone]    
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
## 2016-01-13 ENCOUNTER — Ambulatory Visit: Payer: Medicare Other | Attending: Anesthesiology | Admitting: Physical Therapy

## 2016-01-13 ENCOUNTER — Encounter: Payer: Self-pay | Admitting: Physical Therapy

## 2016-01-13 DIAGNOSIS — G8929 Other chronic pain: Secondary | ICD-10-CM | POA: Insufficient documentation

## 2016-01-13 DIAGNOSIS — M545 Low back pain, unspecified: Secondary | ICD-10-CM

## 2016-01-13 DIAGNOSIS — M6281 Muscle weakness (generalized): Secondary | ICD-10-CM | POA: Insufficient documentation

## 2016-01-13 NOTE — Patient Instructions (Signed)
ADDUCTION: Isometric    With ball between knees, squeeze them inward. Hold _3__ seconds. Complete _2__ sets of __10_ repetitions. Perform _1_ sessions per day.  http://gtsc.exer.us/124   Copyright  VHI. All rights reserved.  Abdominals: Single Leg Bend    Lying on back with legs out straight, inhale, then exhale while slowly sliding heel along floor toward buttocks. Slowly return to starting position. Repeat __10__ times each leg per set. Do ___2_ sets per session. Do __1__ sessions per day.  Copyright  VHI. All rights reserved.  Bridging    Slowly raise buttocks from floor, keeping stomach tight. Repeat _10___ times per set. Do __2__ sets per session. Do __1__ sessions per day.  http://orth.exer.us/1096   Copyright  VHI. All rights reserved.  Jeanie Sewer PTA [facility] [address] [place],  [address] Phone # [phone] Fax [phone]

## 2016-01-13 NOTE — Therapy (Signed)
[facility] [address] [place], [place], [address] Phone: [phone]   Fax:  [phone]  Physical Therapy Treatment  Patient Details  Name: Jesse Bell MRN: [id number] Date of Birth: 02-22-1961 Referring Provider: Dr. Angie Fava  Encounter Date: 01/13/2016      PT End of Session - 01/13/16 1448    Visit Number 2   Number of Visits 10   Date for PT Re-Evaluation 02/29/16   Authorization Type Medicare G codes;  KX at visit 15   PT Start Time 1446   PT Stop Time 1526   PT Time Calculation (min) 40 min   Activity Tolerance Patient tolerated treatment well      Past Medical History:  Diagnosis Date  . Back pain   . Hypertension     Past Surgical History:  Procedure Laterality Date  . FRACTURE SURGERY    . JOINT REPLACEMENT      There were no vitals filed for this visit.      Subjective Assessment - 01/13/16 1447    Subjective Pt reports no pain at the moment stating feels pretty good.    Pertinent History Norco pain medicine 5 mg   Limitations Walking;House hold activities;Standing;Lifting   How long can you sit comfortably? depends on chair;  10 min   How long can you walk comfortably? 1/2 block   Diagnostic tests Myelogram 1 year ago   Patient Stated Goals be able to stay active and b/c Pain [place] requires;  go out and play music   Currently in Pain? No/denies                         [facility] Adult PT Treatment/Exercise - 01/13/16 0001      Exercises   Exercises Lumbar;Knee/Hip     Lumbar Exercises: Supine   Ab Set 10 reps   Heel Slides 20 reps   Bent Knee Raise 20 reps   Other Supine Lumbar Exercises Ball squeeze with abdominal brace     Knee/Hip Exercises: Aerobic   Nustep L1 x 6 minutes     Knee/Hip Exercises: Seated   Clamshell with TheraBand Yellow   Hamstring Curl Both;2 sets;10 reps   Hamstring Limitations yellow tband                PT Education -  01/13/16 1529    Education provided Yes   Education Details LE and core strengthening   Person(s) Educated Patient   Methods Explanation;Demonstration;Handout   Comprehension Verbalized understanding          PT Short Term Goals - 01/04/16 2006      PT SHORT TERM GOAL #1   Title The patient will demonstrate compliance with a basic HEP including an initial walking program and low level core activation ex's   02/01/16   Time 4   Period Weeks   Status New     PT SHORT TERM GOAL #2   Title The patient will report a 25% improvement in home and community activity level (playing music) with minimal pain exacerbation   Time 4   Period Weeks   Status New           PT Long Term Goals - 01/04/16 2009      PT LONG TERM GOAL #1   Title The patient will be independent in safe, self progression of a HEP, walking program and/or aquatic ex program for further improvements in strength  and function   02/29/16   Time 8   Period Weeks   Status New     PT LONG TERM GOAL #2   Title The patient will have improved core strength grossly 4-/5 needed for sitting to play music and standing for longer periods of time   Time 8   Period Weeks   Status New     PT LONG TERM GOAL #3   Title The patient will be able to walk 15 min daily for health benefits and improved endurance for increased community socialization   Time 8   Period Weeks   Status New     PT LONG TERM GOAL #4   Title FOTO functional outcome score improved from 55% limitation to 45% indicating improved function with less pain   Time 8   Period Weeks   Status New               Plan - 01/13/16 1518    Clinical Impression Statement Pt presents with Rt LE weakness due to previous injury that is contricuting to gait abnormality and LBP. Pt has decreased tolerance for laying supine.  Able to tolerate all exercises well. Needing some verbal cues for abdominal bracing and breathing during exercises. Pt will continue to benefit  from skilled therapy for core stabliity and Bil LE strength.    Rehab Potential Good   Clinical Impairments Affecting Rehab Potential 6 surgeries right femur with > 2 inch leg length discrepancy;  patient requests only 1x/week for financial reasons   PT Frequency 1x / week   PT Duration 8 weeks   PT Treatment/Interventions ADLs/Self Care Home Management;Cryotherapy;Electrical Stimulation;Moist Heat;Therapeutic exercise;Patient/family education;Manual techniques;Dry needling;Taping;Ultrasound   PT Next Visit Plan very low level core strengthening ex's in supine, sidelying, sitting and standing to establish HEP for maintenance;  gentle stretch of QL left > right;  estim/heat for pain control as needed (may be good candidate for home TENS unit)   PT Home Exercise Plan LE strengthening    Consulted and Agree with Plan of Care Patient      Patient will benefit from skilled therapeutic intervention in order to improve the following deficits and impairments:  Decreased activity tolerance, Decreased range of motion, Decreased strength, Difficulty walking, Pain, Impaired flexibility  Visit Diagnosis: Chronic midline low back pain without sciatica  Muscle weakness (generalized)     Problem List Patient Active Problem List   Diagnosis Date Noted  . Mixed hyperlipidemia 01/27/2015  . Hypertension 05/03/2012  . Back pain 05/03/2012    Mikle Bosworth 01/13/2016, 3:34 PM  [facility] [address] [place], [place], [address] Phone: [phone]   Fax:  [phone]  Name: Jesse Bell MRN: [id number] Date of Birth: 1960-09-05

## 2016-01-20 ENCOUNTER — Encounter: Payer: Self-pay | Admitting: Physical Therapy

## 2016-01-20 ENCOUNTER — Ambulatory Visit: Payer: Medicare Other | Admitting: Physical Therapy

## 2016-01-20 DIAGNOSIS — M545 Low back pain: Secondary | ICD-10-CM | POA: Diagnosis not present

## 2016-01-20 DIAGNOSIS — G8929 Other chronic pain: Secondary | ICD-10-CM

## 2016-01-20 DIAGNOSIS — M6281 Muscle weakness (generalized): Secondary | ICD-10-CM

## 2016-01-20 NOTE — Therapy (Signed)
[facility] [address] [place], [place], [address] Phone: [phone]   Fax:  [phone]  Physical Therapy Treatment  Patient Details  Name: NORMON RADFORD MRN: [id number] Date of Birth: [birth year] Referring Provider: Dr. Angie Fava  Encounter Date: 01/20/2016      PT End of Session - 01/20/16 1404    Visit Number 3   Number of Visits 10   Date for PT Re-Evaluation 02/29/16   Authorization Type Medicare G codes;  KX at visit 15   PT Start Time 1400   PT Stop Time 1457   PT Time Calculation (min) 57 min   Activity Tolerance Patient tolerated treatment well      Past Medical History:  Diagnosis Date  . Back pain   . Hypertension     Past Surgical History:  Procedure Laterality Date  . FRACTURE SURGERY    . JOINT REPLACEMENT      There were no vitals filed for this visit.      Subjective Assessment - 01/20/16 1402    Subjective Pt reports some pain in Rt knee hip and back.    Pertinent History Norco pain medicine 5 mg   Limitations Walking;House hold activities;Standing;Lifting   How long can you sit comfortably? depends on chair;  10 min   How long can you walk comfortably? 1/2 block   Diagnostic tests Myelogram 1 year ago   Patient Stated Goals be able to stay active and b/c Pain [place] requires;  go out and play music   Currently in Pain? Yes   Pain Score 5    Pain Location Back   Pain Orientation Lower   Pain Type Chronic pain   Pain Onset More than a month ago   Pain Frequency Constant   Aggravating Factors  Sitting too long                         [facility] Adult PT Treatment/Exercise - 01/20/16 0001      Lumbar Exercises: Stretches   Active Hamstring Stretch --   Single Knee to Chest Stretch 2 reps;10 seconds   Quadruped Mid Back Stretch 2 reps;10 seconds     Lumbar Exercises: Standing   Other Standing Lumbar Exercises Lat pulls with ab set  red tband     Lumbar  Exercises: Supine   Ab Set 20 reps   Clam 20 reps  Red tband   Heel Slides 20 reps   Bent Knee Raise 20 reps   Other Supine Lumbar Exercises Ball squeeze with abdominal brace     Knee/Hip Exercises: Standing   Hip Flexion Stengthening;Both;10 reps;1 set  Rt foot on 2" box for Lt foot clearance   Hip Abduction Stengthening;Both;1 set;10 reps  Rt foot on 2" box   Hip Extension Stengthening;Both;1 set;10 reps  Rt foot on 2" box     Modalities   Modalities Electrical Stimulation     Electrical Stimulation   Electrical Stimulation Location low back   Electrical Stimulation Action IFC   Electrical Stimulation Parameters to tolerance   Electrical Stimulation Goals Pain                PT Education - 01/20/16 1437    Education provided Yes   Education Details Body mechanics   Person(s) Educated Patient   Methods Explanation;Demonstration;Handout   Comprehension Verbalized understanding          PT Short Term Goals - 01/20/16  [place] #1   Title The patient will demonstrate compliance with a basic HEP including an initial walking program and low level core activation ex's   02/01/16   Time 4   Period Weeks   Status On-going     PT SHORT TERM GOAL #2   Title The patient will report a 25% improvement in home and community activity level (playing music) with minimal pain exacerbation   Time 4   Period Weeks   Status On-going           PT Long Term Goals - 01/20/16 1447      PT LONG TERM GOAL #1   Title The patient will be independent in safe, self progression of a HEP, walking program and/or aquatic ex program for further improvements in strength and function   02/29/16   Time 8   Period Weeks   Status On-going     PT LONG TERM GOAL #2   Title The patient will have improved core strength grossly 4-/5 needed for sitting to play music and standing for longer periods of time   Time 8   Period Weeks   Status On-going     PT LONG TERM  GOAL #3   Title The patient will be able to walk 15 min daily for health benefits and improved endurance for increased community socialization   Time 8   Period Weeks   Status On-going     PT LONG TERM GOAL #4   Title FOTO functional outcome score improved from 55% limitation to 45% indicating improved function with less pain   Time 8   Period Weeks   Status On-going             Patient will benefit from skilled therapeutic intervention in order to improve the following deficits and impairments:     Visit Diagnosis: Chronic midline low back pain without sciatica  Muscle weakness (generalized)     Problem List Patient Active Problem List   Diagnosis Date Noted  . Mixed hyperlipidemia 01/27/2015  . Hypertension 05/03/2012  . Back pain 05/03/2012    Mikle Bosworth PTA 01/20/2016, 5:21 PM  [facility] [address], [place], [place], [address] Phone: [phone]   Fax:  [phone]  Name: ATHAN ULEP MRN: [id number] Date of Birth: 05/15/1960

## 2016-01-20 NOTE — Patient Instructions (Signed)
   Lifting Principles  .Maintain proper posture and head alignment. .Slide object as close as possible before lifting. .Move obstacles out of the way. .Test before lifting; ask for help if too heavy. .Tighten stomach muscles without holding breath. .Use smooth movements; do not jerk. .Use legs to do the work, and pivot with feet. .Distribute the work load symmetrically and close to the center of trunk. .Push instead of pull whenever possible.   Squat down and hold basket close to stand. Use leg muscles to do the work.    Avoid twisting or bending back. Pivot around using foot movements, and bend at knees if needed when reaching for articles.        Getting Into / Out of Bed   Lower self to lie down on one side by raising legs and lowering head at the same time. Use arms to assist moving without twisting. Bend both knees to roll onto back if desired. To sit up, start from lying on side, and use same move-ments in reverse. Keep trunk aligned with legs.    Shift weight from front foot to back foot as item is lifted off shelf.    When leaning forward to pick object up from floor, extend one leg out behind. Keep back straight. Hold onto a sturdy support with other hand.      Sit upright, head facing forward. Try using a roll to support lower back. Keep shoulders relaxed, and avoid rounded back. Keep hips level with knees. Avoid crossing legs for long periods.  Jeanie Sewer PTA [facility] [address], [place], [place] [address] Phone # [phone] Fax [phone]

## 2016-01-25 ENCOUNTER — Other Ambulatory Visit: Payer: Self-pay | Admitting: Physician Assistant

## 2016-01-27 ENCOUNTER — Encounter: Payer: Medicare Other | Admitting: Physical Therapy

## 2016-01-31 ENCOUNTER — Telehealth: Payer: Self-pay

## 2016-01-31 MED ORDER — LISINOPRIL 10 MG PO TABS: ORAL_TABLET | ORAL | 0 refills | Status: DC

## 2016-01-31 NOTE — Telephone Encounter (Signed)
Pt called to check why his lisinopril RF was denied w/note that his therapy had changed. Discussed w/pt and he never recalls any discussions about changing his therapy and has been taking the lisinopril all along. Last OV shows it on his current med list and I see no notes following that OV that DC it. I will send in 1 mos and pt is planning to RTC for f/up in Nov.

## 2016-02-03 ENCOUNTER — Ambulatory Visit: Payer: Medicare Other | Admitting: Physical Therapy

## 2016-02-03 DIAGNOSIS — M545 Low back pain: Principal | ICD-10-CM

## 2016-02-03 DIAGNOSIS — G8929 Other chronic pain: Secondary | ICD-10-CM

## 2016-02-03 DIAGNOSIS — M6281 Muscle weakness (generalized): Secondary | ICD-10-CM

## 2016-02-03 NOTE — Therapy (Signed)
[facility] [address], [address] [place], [place], [address] Phone: [phone]   Fax:  [phone]  Physical Therapy Treatment  Patient Details  Name: Jesse Bell MRN: [id number] Date of Birth: September 24, 1960 Referring Provider: Dr. Nilsa Nutting  Encounter Date: 02/03/2016      PT End of Session - 02/03/16 1307    Visit Number 4   Number of Visits 10   Date for PT Re-Evaluation 02/29/16   Authorization Type Medicare G codes;  KX at visit 15   PT Start Time 1231   PT Stop Time 1314   PT Time Calculation (min) 43 min   Activity Tolerance Patient tolerated treatment well      Past Medical History:  Diagnosis Date  . Back pain   . Hypertension     Past Surgical History:  Procedure Laterality Date  . FRACTURE SURGERY    . JOINT REPLACEMENT      There were no vitals filed for this visit.      Subjective Assessment - 02/03/16 1231    Subjective I think therapy has been doing good.  I've been doing some of those basic ex's at home.  Been doing some traveling to play music.  I've been walking around [employer] today.  Today my back is not bad.  "I need to do a wait and see" on therapy.   "I would like to be discharged but I feel like what you've done has helped though.  It's about to get busy with the holidays coming up."   Currently in Pain? Yes   Pain Score 4    Pain Location Knee   Pain Orientation Right   Pain Type Chronic pain            [facility] PT Assessment - 02/03/16 0001      Strength   Right Hip Flexion 2-/5   Right Hip Extension 2-/5   Right Hip ABduction 2-/5   Left Hip Flexion 4/5   Left Hip Extension 4/5   Left Hip ABduction 4/5   Lumbar Flexion 3+/5   Lumbar Extension 3+/5                     [facility] Adult PT Treatment/Exercise - 02/03/16 0001      Lumbar Exercises: Seated   Sit to Stand Limitations seated 2# core series:  hip to hip, shoulder to opp hip, Vs, ear to ear 30 sec each     Knee/Hip Exercises: Aerobic   Nustep L1 6 min     Knee/Hip Exercises: Seated   Long Arc Quad Strengthening;Right;Left;1 set;10 reps   Long Arc Quad Limitations red band   Clamshell with TheraBand Red   Knee/Hip Flexion red band ankle DF with hip flexion 10x each                PT Education - 02/03/16 1305    Education provided Yes   Education Details seated core and LE strengthening    Person(s) Educated Patient   Methods Explanation;Demonstration;Handout   Comprehension Verbalized understanding;Returned demonstration          PT Short Term Goals - 02/03/16 1238      PT SHORT TERM GOAL #1   Title The patient will demonstrate compliance with a basic HEP including an initial walking program and low level core activation ex's   02/01/16   Status Achieved     PT SHORT TERM GOAL #2   Title The patient  will report a 25% improvement in home and community activity level (playing music) with minimal pain exacerbation   Status Achieved           PT Long Term Goals - 02-22-16 1237      PT LONG TERM GOAL #1   Title The patient will be independent in safe, self progression of a HEP, walking program and/or aquatic ex program for further improvements in strength and function   02/29/16   Status Achieved     PT LONG TERM GOAL #2   Title The patient will have improved core strength grossly 4-/5 needed for sitting to play music and standing for longer periods of time   Status Partially Met     PT LONG TERM GOAL #3   Title The patient will be able to walk 15 min daily for health benefits and improved endurance for increased community socialization   Status Achieved     PT LONG TERM GOAL #4   Title FOTO functional outcome score improved from 55% limitation to 45% indicating improved function with less pain   Status Partially Met               Plan - 2016-02-22 1311    Clinical Impression Statement The patient requests discharge from PT secondary to business with  upcoming music, events and holidays.  He states he is overall about 50% better and states he feels ready to independently manage through a HEP.   Partial goals met.  Discharge per patient request.        Patient will benefit from skilled therapeutic intervention in order to improve the following deficits and impairments:     Visit Diagnosis: Chronic midline low back pain without sciatica  Muscle weakness (generalized)       G-Codes - 2016-02-22 1311    Functional Assessment Tool Used FOTO;  clinical judgement    Functional Limitation Mobility: Walking and moving around   Mobility: Walking and Moving Around Goal Status [phone]) At least 40 percent but less than 60 percent impaired, limited or restricted   Mobility: Walking and Moving Around Discharge Status [phone]) At least 40 percent but less than 60 percent impaired, limited or restricted      Problem List Patient Active Problem List   Diagnosis Date Noted  . Mixed hyperlipidemia 01/27/2015  . Hypertension 05/03/2012  . Back pain 05/03/2012    Alvera Singh February 22, 2016, 1:15 PM  [facility] [address] [place], [place], [address] Phone: [phone]   Fax:  [phone]  Name: KANAAN KAGAWA MRN: [id number] Date of Birth: [birth year]

## 2016-02-03 NOTE — Patient Instructions (Signed)
Seated with good posture:  Brace abdominals, use small handweights to do hip to hip,                                                                                                                                Shoulder to hip diagonals,                                                                                                                            Shoulder to between the knees to opposite shoulder,                                                                                                                             Ear to ear "Snoopy, I'm the champ."                                                     30 sec each  Using the red band:  Around thighs double and single leg "clams"                                   Figure 8 with band around feet:  Press leg outward 10x                                   Figure 8 with band around feet Toe up/hip up 10x  Jesse Bell PT [facility] [address], [place], [place] [address] Phone # [phone] Fax [phone]

## 2016-02-03 NOTE — Therapy (Signed)
[facility] [address] [place], [place], [address] Phone: [phone]   Fax:  [phone]  Physical Therapy Treatment/Discharge Summary  Patient Details  Name: Jesse Bell MRN: [id number] Date of Birth: 10-06-1960 Referring Provider: Dr. Angie Fava  Encounter Date: 02/03/2016      PT End of Session - 02/03/16 1307    Visit Number 4   Number of Visits 10   Date for PT Re-Evaluation 02/29/16   Authorization Type Medicare G codes;  KX at visit 15   PT Start Time 1231   PT Stop Time 1314   PT Time Calculation (min) 43 min   Activity Tolerance Patient tolerated treatment well      Past Medical History:  Diagnosis Date  . Back pain   . Hypertension     Past Surgical History:  Procedure Laterality Date  . FRACTURE SURGERY    . JOINT REPLACEMENT      There were no vitals filed for this visit.      Subjective Assessment - 02/03/16 1231    Subjective I think therapy has been doing good.  I've been doing some of those basic ex's at home.  Been doing some traveling to play music.  I've been walking around [place] today.  Today my back is not bad.  "I need to do a wait and see" on therapy.   "I would like to be discharged but I feel like what you've done has helped though.  It's about to get busy with the holidays coming up."   Currently in Pain? Yes   Pain Score 4    Pain Location Knee   Pain Orientation Right   Pain Type Chronic pain            [facility] PT Assessment - 02/03/16 0001      Observation/Other Assessments   Focus on Therapeutic Outcomes (FOTO)  46% limitation     Strength   Right Hip Flexion 2-/5   Right Hip Extension 2-/5   Right Hip ABduction 2-/5   Left Hip Flexion 4/5   Left Hip Extension 4/5   Left Hip ABduction 4/5   Lumbar Flexion 3+/5   Lumbar Extension 3+/5                     [facility] Adult PT Treatment/Exercise - 02/03/16 0001      Lumbar Exercises: Seated   Sit to Stand Limitations seated 2# core series:  hip to hip, shoulder to opp hip, Vs, ear to ear 30 sec each     Knee/Hip Exercises: Aerobic   Nustep L1 6 min     Knee/Hip Exercises: Seated   Long Arc Quad Strengthening;Right;Left;1 set;10 reps   Long Arc Quad Limitations red band   Clamshell with TheraBand Red   Knee/Hip Flexion red band ankle DF with hip flexion 10x each                PT Education - 02/03/16 1305    Education provided Yes   Education Details seated core and LE strengthening    Person(s) Educated Patient   Methods Explanation;Demonstration;Handout   Comprehension Verbalized understanding;Returned demonstration          PT Short Term Goals - 02/03/16 1238      PT SHORT TERM GOAL #1   Title The patient will demonstrate compliance with a basic HEP including an initial walking program and low level core activation ex's   02/01/16  Status Achieved     PT SHORT TERM GOAL #2   Title The patient will report a 25% improvement in home and community activity level (playing music) with minimal pain exacerbation   Status Achieved           PT Long Term Goals - 02/12/2016 1237      PT LONG TERM GOAL #1   Title The patient will be independent in safe, self progression of a HEP, walking program and/or aquatic ex program for further improvements in strength and function   02/29/16   Status Achieved     PT LONG TERM GOAL #2   Title The patient will have improved core strength grossly 4-/5 needed for sitting to play music and standing for longer periods of time   Status Partially Met     PT LONG TERM GOAL #3   Title The patient will be able to walk 15 min daily for health benefits and improved endurance for increased community socialization   Status Achieved     PT LONG TERM GOAL #4   Title FOTO functional outcome score improved from 55% limitation to 45% indicating improved function with less pain   Status Partially Met               Plan -  02/12/2016 1311    Clinical Impression Statement The patient requests discharge from PT secondary to business with upcoming music, events and holidays.  He states he is overall about 50% better and states he feels ready to independently manage through a HEP.   Partial goals met.  Discharge per patient request.        Patient will benefit from skilled therapeutic intervention in order to improve the following deficits and impairments:     Visit Diagnosis: Chronic midline low back pain without sciatica  Muscle weakness (generalized)  PHYSICAL THERAPY DISCHARGE SUMMARY  Visits from Start of Care: 4  Current functional level related to goals / functional outcomes: See clinical impressions above.  Improvements in core strength.  Partial goals met.  Good improvement in FOTO functional outcome score from 55% limitation to 46% limitation.     Remaining deficits: As above.  Chronic right LE orthopedic issues with significant leg length discrepancy   Education / Equipment: HEP Plan: Patient agrees to discharge.  Patient goals were partially met. Patient is being discharged due to the patient's request.  ?????         G-Codes - 02-12-2016 1311    Functional Assessment Tool Used FOTO;  clinical judgement    Functional Limitation Mobility: Walking and moving around   Mobility: Walking and Moving Around Goal Status [phone]) At least 40 percent but less than 60 percent impaired, limited or restricted   Mobility: Walking and Moving Around Discharge Status [phone]) At least 40 percent but less than 60 percent impaired, limited or restricted      Problem List Patient Active Problem List   Diagnosis Date Noted  . Mixed hyperlipidemia 01/27/2015  . Hypertension 05/03/2012  . Back pain 05/03/2012   Ruben Im, PT 02/12/2016 1:21 PM Phone: [phone] Fax: [phone]  Alvera Singh 02-12-2016, 1:18 PM  [facility] [address] [place], [place], [address] Phone: [phone]   Fax:  [phone]  Name: COLSTON PYLE MRN: [id number] Date of Birth: 1960/08/30

## 2016-02-25 ENCOUNTER — Ambulatory Visit (INDEPENDENT_AMBULATORY_CARE_PROVIDER_SITE_OTHER): Payer: Medicare Other | Admitting: Physician Assistant

## 2016-02-25 VITALS — BP 126/70 | HR 75 | Temp 98.0°F | Resp 16 | Wt 200.0 lb

## 2016-02-25 DIAGNOSIS — I10 Essential (primary) hypertension: Secondary | ICD-10-CM | POA: Diagnosis not present

## 2016-02-25 DIAGNOSIS — E785 Hyperlipidemia, unspecified: Secondary | ICD-10-CM | POA: Diagnosis not present

## 2016-02-25 DIAGNOSIS — M1990 Unspecified osteoarthritis, unspecified site: Secondary | ICD-10-CM

## 2016-02-25 LAB — COMPLETE METABOLIC PANEL WITH GFR
ALBUMIN: 4.5 g/dL (ref 3.6–5.1)
ALK PHOS: 42 U/L (ref 40–115)
ALT: 16 U/L (ref 9–46)
AST: 24 U/L (ref 10–35)
BILIRUBIN TOTAL: 0.4 mg/dL (ref 0.2–1.2)
BUN: 19 mg/dL (ref 7–25)
CO2: 27 mmol/L (ref 20–31)
CREATININE: 0.87 mg/dL (ref 0.70–1.33)
Calcium: 9.5 mg/dL (ref 8.6–10.3)
Chloride: 102 mmol/L (ref 98–110)
GFR, Est African American: >89 mL/min (ref >=60)
GLUCOSE: 84 mg/dL (ref 65–99)
Potassium: 4.8 mmol/L (ref 3.5–5.3)
SODIUM: 136 mmol/L (ref 135–146)
TOTAL PROTEIN: 7 g/dL (ref 6.1–8.1)

## 2016-02-25 LAB — CBC WITH DIFFERENTIAL/PLATELET
BASOS PCT: 1 %
Basophils Absolute: 95 cells/uL (ref 0–200)
Eosinophils Absolute: 380 cells/uL (ref 15–500)
Eosinophils Relative: 4 %
HCT: 39.5 % (ref 38.5–50.0)
Hemoglobin: 12.7 g/dL — ABNORMAL LOW (ref 13.2–17.1)
LYMPHS PCT: 42 %
Lymphs Abs: 3990 cells/uL — ABNORMAL HIGH (ref 850–3900)
MCH: 28.3 pg (ref 27.0–33.0)
MCHC: 32.2 g/dL (ref 32.0–36.0)
MCV: 88 fL (ref 80.0–100.0)
MONOS PCT: 10 %
MPV: 10 fL (ref 7.5–12.5)
Monocytes Absolute: 950 cells/uL (ref 200–950)
NEUTROS PCT: 43 %
Neutro Abs: 4085 cells/uL (ref 1500–7800)
PLATELETS: 399 10*3/uL (ref 140–400)
RBC: 4.49 MIL/uL (ref 4.20–5.80)
RDW: 13.6 % (ref 11.0–15.0)
WBC: 9.5 10*3/uL (ref 3.8–10.8)

## 2016-02-25 LAB — LIPID PANEL
Cholesterol: 181 mg/dL (ref <200)
HDL: 62 mg/dL (ref >40)
LDL Cholesterol: 96 mg/dL (ref <100)
TRIGLYCERIDES: 116 mg/dL (ref <150)
Total CHOL/HDL Ratio: 2.9 Ratio (ref <5.0)
VLDL: 23 mg/dL (ref <30)

## 2016-02-25 MED ORDER — LISINOPRIL 10 MG PO TABS: ORAL_TABLET | ORAL | 1 refills | Status: DC

## 2016-02-25 MED ORDER — PRAVASTATIN SODIUM 80 MG PO TABS: ORAL_TABLET | ORAL | 1 refills | Status: DC

## 2016-02-25 MED ORDER — ETODOLAC 400 MG PO TABS: 400.0000 mg | ORAL_TABLET | Freq: Every day | ORAL | 1 refills | Status: DC

## 2016-02-25 MED ORDER — METOPROLOL SUCCINATE ER 50 MG PO TB24: 50.0000 mg | ORAL_TABLET | Freq: Every day | ORAL | 1 refills | Status: DC
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
## 2016-02-25 NOTE — Patient Instructions (Addendum)
We will contact you with your lab results.  Follow up in 6 months for lab recheck and medication refill.  Thank you for letting me participate in your health and well being.     IF you received an x-ray today, you will receive an invoice from [facility]. Please contact [facility] at [phone] with questions or concerns regarding your invoice.   IF you received labwork today, you will receive an invoice from [employer] [facility]. Please contact [facility] at [phone] with questions or concerns regarding your invoice.   Our billing staff will not be able to assist you with questions regarding bills from these companies.  You will be contacted with the lab results as soon as they are available. The fastest way to get your results is to activate your My Chart account. Instructions are located on the last page of this paperwork. If you have not heard from [place] regarding the results in 2 weeks, please contact this office.

## 2016-08-26 ENCOUNTER — Other Ambulatory Visit: Payer: Self-pay | Admitting: Physician Assistant

## 2016-08-26 DIAGNOSIS — I10 Essential (primary) hypertension: Secondary | ICD-10-CM

## 2016-08-26 DIAGNOSIS — E785 Hyperlipidemia, unspecified: Secondary | ICD-10-CM

## 2016-09-01 ENCOUNTER — Ambulatory Visit (INDEPENDENT_AMBULATORY_CARE_PROVIDER_SITE_OTHER): Payer: PPO | Admitting: Physician Assistant

## 2016-09-01 ENCOUNTER — Encounter: Payer: Self-pay | Admitting: Physician Assistant

## 2016-09-01 VITALS — BP 120/77 | HR 84 | Temp 97.8°F | Resp 17 | Ht 69.5 in | Wt 202.0 lb

## 2016-09-01 DIAGNOSIS — Z87828 Personal history of other (healed) physical injury and trauma: Secondary | ICD-10-CM | POA: Insufficient documentation

## 2016-09-01 DIAGNOSIS — J208 Acute bronchitis due to other specified organisms: Secondary | ICD-10-CM

## 2016-09-01 DIAGNOSIS — W57XXXA Bitten or stung by nonvenomous insect and other nonvenomous arthropods, initial encounter: Secondary | ICD-10-CM | POA: Diagnosis not present

## 2016-09-01 DIAGNOSIS — J069 Acute upper respiratory infection, unspecified: Secondary | ICD-10-CM | POA: Diagnosis not present

## 2016-09-01 MED ORDER — ALBUTEROL SULFATE HFA 108 (90 BASE) MCG/ACT IN AERS: 2.0000 | INHALATION_SPRAY | Freq: Four times a day (QID) | RESPIRATORY_TRACT | 0 refills | Status: DC | PRN

## 2016-09-01 MED ORDER — HYDROCODONE-HOMATROPINE 5-1.5 MG/5ML PO SYRP: 5.0000 mL | ORAL_SOLUTION | Freq: Three times a day (TID) | ORAL | 0 refills | Status: DC | PRN

## 2016-09-01 NOTE — Patient Instructions (Addendum)
  Please push fluids.  Tylenol and Motrin for fever and body aches.      Nasal saline spray can be helpful to keep the mucus membranes moist and thin the nasal mucus    IF you received an x-ray today, you will receive an invoice from [facility]. Please contact  Radiology at [phone] with questions or concerns regarding your invoice.   IF you received labwork today, you will receive an invoice from [facility]. Please contact [facility] at [phone] with questions or concerns regarding your invoice.   Our billing staff will not be able to assist you with questions regarding bills from these companies.  You will be contacted with the lab results as soon as they are available. The fastest way to get your results is to activate your My Chart account. Instructions are located on the last page of this paperwork. If you have not heard from us regarding the results in 2 weeks, please contact this office.     
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
## 2016-09-05 ENCOUNTER — Telehealth: Payer: Self-pay | Admitting: Family Medicine

## 2016-09-05 MED ORDER — BENZONATATE 100 MG PO CAPS: ORAL_CAPSULE | ORAL | 0 refills | Status: AC

## 2016-09-05 NOTE — Telephone Encounter (Signed)
Sent to pharmacy 

## 2016-09-05 NOTE — Telephone Encounter (Signed)
Pt calling to let sarah know that he isn't feeling any better and would like some tessalon pearls please respond

## 2016-09-29 ENCOUNTER — Other Ambulatory Visit: Payer: Self-pay | Admitting: Physician Assistant

## 2016-09-29 DIAGNOSIS — I10 Essential (primary) hypertension: Secondary | ICD-10-CM

## 2016-09-29 DIAGNOSIS — J208 Acute bronchitis due to other specified organisms: Secondary | ICD-10-CM

## 2016-09-29 DIAGNOSIS — E785 Hyperlipidemia, unspecified: Secondary | ICD-10-CM

## 2016-09-29 DIAGNOSIS — J069 Acute upper respiratory infection, unspecified: Secondary | ICD-10-CM

## 2016-10-05 ENCOUNTER — Telehealth: Payer: Self-pay | Admitting: Physician Assistant

## 2016-10-05 NOTE — Telephone Encounter (Signed)
Pt is needing to talk with someone about getting his meds refilled since he now has an appt on 10/13/16  Best number [phone]

## 2016-10-06 NOTE — Telephone Encounter (Signed)
Patient has an OV scheduled. Will meds be refilled at this OV ? Please advise

## 2016-10-07 NOTE — Telephone Encounter (Signed)
Please call patient and ask which meds he needs refills on? I am happy to give him enough refills until his OV on 7/6. Thanks!

## 2016-10-09 ENCOUNTER — Encounter: Payer: Self-pay | Admitting: Physician Assistant

## 2016-10-09 ENCOUNTER — Ambulatory Visit (INDEPENDENT_AMBULATORY_CARE_PROVIDER_SITE_OTHER): Payer: PPO | Admitting: Physician Assistant

## 2016-10-09 VITALS — BP 156/97 | HR 86 | Temp 97.9°F | Resp 18 | Ht 69.5 in | Wt 190.6 lb

## 2016-10-09 DIAGNOSIS — E785 Hyperlipidemia, unspecified: Secondary | ICD-10-CM

## 2016-10-09 DIAGNOSIS — E782 Mixed hyperlipidemia: Secondary | ICD-10-CM | POA: Diagnosis not present

## 2016-10-09 DIAGNOSIS — Z114 Encounter for screening for human immunodeficiency virus [HIV]: Secondary | ICD-10-CM | POA: Diagnosis not present

## 2016-10-09 DIAGNOSIS — D509 Iron deficiency anemia, unspecified: Secondary | ICD-10-CM | POA: Diagnosis not present

## 2016-10-09 DIAGNOSIS — I10 Essential (primary) hypertension: Secondary | ICD-10-CM | POA: Diagnosis not present

## 2016-10-09 DIAGNOSIS — J45909 Unspecified asthma, uncomplicated: Secondary | ICD-10-CM

## 2016-10-09 MED ORDER — METOPROLOL SUCCINATE ER 50 MG PO TB24: ORAL_TABLET | ORAL | 1 refills | Status: DC

## 2016-10-09 MED ORDER — LISINOPRIL 10 MG PO TABS: ORAL_TABLET | ORAL | 1 refills | Status: DC

## 2016-10-09 MED ORDER — ALBUTEROL SULFATE HFA 108 (90 BASE) MCG/ACT IN AERS: 2.0000 | INHALATION_SPRAY | Freq: Four times a day (QID) | RESPIRATORY_TRACT | 0 refills | Status: DC | PRN

## 2016-10-09 MED ORDER — PRAVASTATIN SODIUM 80 MG PO TABS: 80.0000 mg | ORAL_TABLET | Freq: Every day | ORAL | 1 refills | Status: DC

## 2016-10-09 NOTE — Patient Instructions (Signed)
     IF you received an x-ray today, you will receive an invoice from [facility]. Please contact [facility] at [phone] with questions or concerns regarding your invoice.   IF you received labwork today, you will receive an invoice from [facility]. Please contact [facility] at [phone] with questions or concerns regarding your invoice.   Our billing staff will not be able to assist you with questions regarding bills from these companies.  You will be contacted with the lab results as soon as they are available. The fastest way to get your results is to activate your My Chart account. Instructions are located on the last page of this paperwork. If you have not heard from us regarding the results in 2 weeks, please contact this office.     
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
## 2016-10-09 NOTE — Addendum Note (Signed)
Addended by: Mancel Bale on: 10/09/2016 12:13 PM   Modules accepted: Orders

## 2016-10-09 NOTE — Telephone Encounter (Signed)
PT had an OV

## 2016-10-10 LAB — LIPID PANEL
CHOLESTEROL TOTAL: 207 mg/dL — AB (ref 100–199)
Chol/HDL Ratio: 3.8 ratio (ref 0.0–5.0)
HDL: 55 mg/dL (ref >39)
LDL CALC: 115 mg/dL — AB (ref 0–99)
TRIGLYCERIDES: 185 mg/dL — AB (ref 0–149)
VLDL Cholesterol Cal: 37 mg/dL (ref 5–40)

## 2016-10-10 LAB — CMP14+EGFR
A/G RATIO: 2 (ref 1.2–2.2)
ALBUMIN: 4.4 g/dL (ref 3.5–5.5)
ALK PHOS: 55 IU/L (ref 39–117)
ALT: 19 IU/L (ref 0–44)
AST: 32 IU/L (ref 0–40)
BILIRUBIN TOTAL: 0.3 mg/dL (ref 0.0–1.2)
BUN / CREAT RATIO: 15 (ref 9–20)
BUN: 12 mg/dL (ref 6–24)
CHLORIDE: 98 mmol/L (ref 96–106)
CO2: 22 mmol/L (ref 20–29)
CREATININE: 0.81 mg/dL (ref 0.76–1.27)
Calcium: 8.9 mg/dL (ref 8.7–10.2)
GFR calc Af Amer: 115 mL/min/{1.73_m2} (ref >59)
GFR calc non Af Amer: 99 mL/min/{1.73_m2} (ref >59)
GLOBULIN, TOTAL: 2.2 g/dL (ref 1.5–4.5)
Glucose: 94 mg/dL (ref 65–99)
POTASSIUM: 4.4 mmol/L (ref 3.5–5.2)
SODIUM: 136 mmol/L (ref 134–144)
Total Protein: 6.6 g/dL (ref 6.0–8.5)

## 2016-10-10 LAB — CBC
Hematocrit: 37.2 % — ABNORMAL LOW (ref 37.5–51.0)
Hemoglobin: 12.4 g/dL — ABNORMAL LOW (ref 13.0–17.7)
MCH: 27.7 pg (ref 26.6–33.0)
MCHC: 33.3 g/dL (ref 31.5–35.7)
MCV: 83 fL (ref 79–97)
Platelets: 361 10*3/uL (ref 150–379)
RBC: 4.47 x10E6/uL (ref 4.14–5.80)
RDW: 13.9 % (ref 12.3–15.4)
WBC: 7.5 10*3/uL (ref 3.4–10.8)

## 2016-10-10 LAB — HIV ANTIBODY (ROUTINE TESTING W REFLEX): HIV SCREEN 4TH GENERATION: NONREACTIVE

## 2016-12-06 ENCOUNTER — Ambulatory Visit (INDEPENDENT_AMBULATORY_CARE_PROVIDER_SITE_OTHER): Payer: PPO

## 2016-12-06 VITALS — BP 126/78 | HR 76 | Temp 97.3°F | Ht 70.0 in | Wt 208.4 lb

## 2016-12-06 DIAGNOSIS — Z0000 Encounter for general adult medical examination without abnormal findings: Secondary | ICD-10-CM

## 2016-12-06 NOTE — Patient Instructions (Addendum)
Jesse Bell , Thank you for taking time to come for your Medicare Wellness Visit. I appreciate your ongoing commitment to your health goals. Please review the following plan we discussed and let me know if I can assist you in the future.   Screening recommendations/referrals: Colonoscopy: Patient states that he will talk to his PCP at his next follow up visit concerning this test.  Recommended yearly ophthalmology/optometry visit for glaucoma screening and checkup Recommended yearly dental visit for hygiene and checkup  Vaccinations: Influenza vaccine: Patient will come back in October/November to get the flu vaccine.  Pneumococcal vaccine: start at age 83  Tdap vaccine: Patient states that he has had a tetanus vaccine within the past 10 years. Can't remember the date or year.  Shingles vaccine: start at age 26    Advanced directives: Advance directive discussed with you today. Even though you declined this today please call our office should you change your mind and we can give you the proper paperwork for you to fill out.  Conditions/risks identified: none  Next appointment: 04/17/17 @ 11:20 am with Jesse Bell 40-64 Years, Male Preventive care refers to lifestyle choices and visits with your health care that can promote health and wellness. What does preventive care include?  A yearly physical exam. This is also called an annual well check.  Dental exams once or twice a year.  Routine eye exams. Ask your health care provider how often you should have your eyes checked.  Personal lifestyle choices, including:  Daily care of your teeth and gums.  Regular physical activity.  Eating a healthy diet.  Avoiding tobacco and drug use.  Limiting alcohol use.  Practicing safe sex.  Taking low-dose aspirin every day starting at age 61. What happens during an annual well check? The services and screenings done by your health care provider during your annual well check  will depend on your age, overall health, lifestyle risk factors, and family history of disease. Counseling  Your health care provider may ask you questions about your:  Alcohol use.  Tobacco use.  Drug use.  Emotional well-being.  Home and relationship well-being.  Sexual activity.  Eating habits.  Work and work Statistician. Screening  You may have the following tests or measurements:  Height, weight, and BMI.  Blood pressure.  Lipid and cholesterol levels. These may be checked every 5 years, or more frequently if you are over 26 years old.  Skin check.  Lung cancer screening. You may have this screening every year starting at age 88 if you have a 30-pack-year history of smoking and currently smoke or have quit within the past 15 years.  Fecal occult blood test (FOBT) of the stool. You may have this test every year starting at age 3.  Flexible sigmoidoscopy or colonoscopy. You may have a sigmoidoscopy every 5 years or a colonoscopy every 10 years starting at age 86.  Prostate cancer screening. Recommendations will vary depending on your family history and other risks.  Hepatitis C blood test.  Hepatitis B blood test.  Sexually transmitted disease (STD) testing.  Diabetes screening. This is done by checking your blood sugar (glucose) after you have not eaten for a while (fasting). You may have this done every 1-3 years. Discuss your test results, treatment options, and if necessary, the need for more tests with your health care provider. Vaccines  Your health care provider may recommend certain vaccines, such as:  Influenza vaccine. This is recommended every year.  Tetanus, diphtheria, and acellular pertussis (Tdap, Td) vaccine. You may need a Td booster every 10 years.  Zoster vaccine. You may need this after age 61.  Pneumococcal 13-valent conjugate (PCV13) vaccine. You may need this if you have certain conditions and have not been vaccinated.  Pneumococcal  polysaccharide (PPSV23) vaccine. You may need one or two doses if you smoke cigarettes or if you have certain conditions. Talk to your health care provider about which screenings and vaccines you need and how often you need them. This information is not intended to replace advice given to you by your health care provider. Make sure you discuss any questions you have with your health care provider. Document Released: 04/23/2015 Document Revised: 12/15/2015 Document Reviewed: 01/26/2015 Elsevier Interactive Patient Education  2017 [employer] Prevention in the Home Falls can cause injuries. They can happen to people of all ages. There are many things you can do to make your home safe and to help prevent falls. What can I do on the outside of my home?  Regularly fix the edges of walkways and driveways and fix any cracks.  Remove anything that might make you trip as you walk through a door, such as a raised step or threshold.  Trim any bushes or trees on the path to your home.  Use bright outdoor lighting.  Clear any walking paths of anything that might make someone trip, such as rocks or tools.  Regularly check to see if handrails are loose or broken. Make sure that both sides of any steps have handrails.  Any raised decks and porches should have guardrails on the edges.  Have any leaves, snow, or ice cleared regularly.  Use sand or salt on walking paths during winter.  Clean up any spills in your garage right away. This includes oil or grease spills. What can I do in the bathroom?  Use night lights.  Install grab bars by the toilet and in the tub and shower. Do not use towel bars as grab bars.  Use non-skid mats or decals in the tub or shower.  If you need to sit down in the shower, use a plastic, non-slip stool.  Keep the floor dry. Clean up any water that spills on the floor as soon as it happens.  Remove soap buildup in the tub or shower regularly.  Attach bath  mats securely with double-sided non-slip rug tape.  Do not have throw rugs and other things on the floor that can make you trip. What can I do in the bedroom?  Use night lights.  Make sure that you have a light by your bed that is easy to reach.  Do not use any sheets or blankets that are too big for your bed. They should not hang down onto the floor.  Have a firm chair that has side arms. You can use this for support while you get dressed.  Do not have throw rugs and other things on the floor that can make you trip. What can I do in the kitchen?  Clean up any spills right away.  Avoid walking on wet floors.  Keep items that you use a lot in easy-to-reach places.  If you need to reach something above you, use a strong step stool that has a grab bar.  Keep electrical cords out of the way.  Do not use floor polish or wax that makes floors slippery. If you must use wax, use non-skid floor wax.  Do not have  throw rugs and other things on the floor that can make you trip. What can I do with my stairs?  Do not leave any items on the stairs.  Make sure that there are handrails on both sides of the stairs and use them. Fix handrails that are broken or loose. Make sure that handrails are as long as the stairways.  Check any carpeting to make sure that it is firmly attached to the stairs. Fix any carpet that is loose or worn.  Avoid having throw rugs at the top or bottom of the stairs. If you do have throw rugs, attach them to the floor with carpet tape.  Make sure that you have a light switch at the top of the stairs and the bottom of the stairs. If you do not have them, ask someone to add them for you. What else can I do to help prevent falls?  Wear shoes that:  Do not have high heels.  Have rubber bottoms.  Are comfortable and fit you well.  Are closed at the toe. Do not wear sandals.  If you use a stepladder:  Make sure that it is fully opened. Do not climb a closed  stepladder.  Make sure that both sides of the stepladder are locked into place.  Ask someone to hold it for you, if possible.  Clearly mark and make sure that you can see:  Any grab bars or handrails.  First and last steps.  Where the edge of each step is.  Use tools that help you move around (mobility aids) if they are needed. These include:  Canes.  Walkers.  Scooters.  Crutches.  Turn on the lights when you go into a dark area. Replace any light bulbs as soon as they burn out.  Set up your furniture so you have a clear path. Avoid moving your furniture around.  If any of your floors are uneven, fix them.  If there are any pets around you, be aware of where they are.  Review your medicines with your doctor. Some medicines can make you feel dizzy. This can increase your chance of falling. Ask your doctor what other things that you can do to help prevent falls. This information is not intended to replace advice given to you by your health care provider. Make sure you discuss any questions you have with your health care provider. Document Released: 01/21/2009 Document Revised: 09/02/2015 Document Reviewed: 05/01/2014 Elsevier Interactive Patient Education  2017 [employer].

## 2016-12-06 NOTE — Progress Notes (Signed)
Subjective:   Jesse Bell is a 56 y.o. male who presents for an Initial Medicare Annual Wellness Visit.  Review of Systems  N/A Cardiac Risk Factors include: advanced age (>51men, >67 women);dyslipidemia;hypertension;male gender    Objective:    Today's Vitals   12/06/16 1311  BP: 126/78  Pulse: 76  Temp: (!) 97.3 F (36.3 C)  TempSrc: Oral  Weight: 208 lb 6 oz (94.5 kg)  Height: 5\' 10"  (1.778 m)   Body mass index is 29.9 kg/m.  Current Medications (verified) Outpatient Encounter Prescriptions as of 12/06/2016  Medication Sig  . albuterol (PROVENTIL HFA;VENTOLIN HFA) 108 (90 Base) MCG/ACT inhaler Inhale 2 puffs into the lungs every 6 (six) hours as needed for wheezing or shortness of breath.  . DEXAMETHASONE PO Take 0.5 tablets by mouth daily as needed (for back and hip pain).   Marland Kitchen etodolac (LODINE) 400 MG tablet Take 1 tablet (400 mg total) by mouth daily.  Marland Kitchen lisinopril (PRINIVIL,ZESTRIL) 10 MG tablet TAKE 1 TABLET BY MOUTH EVERY DAY FOR BLOOD PRESSURE  . metoprolol succinate (TOPROL-XL) 50 MG 24 hr tablet TAKE 1 TABLET(50 MG) BY MOUTH EVERY DAY WITH SUPPER  . pravastatin (PRAVACHOL) 80 MG tablet Take 1 tablet (80 mg total) by mouth daily.   No facility-administered encounter medications on file as of 12/06/2016.     Allergies (verified) Patient has no known allergies.   History: Past Medical History:  Diagnosis Date  . Back pain   . Hypertension    Past Surgical History:  Procedure Laterality Date  . FRACTURE SURGERY    . JOINT REPLACEMENT     History reviewed. No pertinent family history. Social History   Occupational History  . Not on file.   Social History Main Topics  . Smoking status: Never Smoker  . Smokeless tobacco: Never Used  . Alcohol use 0.6 - 1.2 oz/week    1 - 2 Cans of beer per week  . Drug use: No  . Sexual activity: No   Tobacco Counseling Counseling given: Not Answered   Activities of Daily Living In your present state of  health, do you have any difficulty performing the following activities: 12/06/2016  Hearing? N  Vision? N  Difficulty concentrating or making decisions? N  Walking or climbing stairs? Y  Comment Patient has leg issues and he has to sit and slid down stairs.   Dressing or bathing? N  Doing errands, shopping? N  Preparing Food and eating ? N  Using the Toilet? N  In the past six months, have you accidently leaked urine? N  Do you have problems with loss of bowel control? N  Managing your Medications? N  Managing your Finances? N  Housekeeping or managing your Housekeeping? N  Some recent data might be hidden    Immunizations and Health Maintenance  There is no immunization history on file for this patient. Health Maintenance Due  Topic Date Due  . INFLUENZA VACCINE  11/08/2016    Patient Care Team: Mittie Bodo as PCP - General (Physician Assistant)  Indicate any recent Medical Services you may have received from other than [facility] providers in the past year (date may be approximate).    Assessment:   This is a routine wellness examination for [facility].   Hearing/Vision screen Vision Screening Comments: Patient does not see an eye doctor on a regular basis. Encouraged patient to see an eye doctor yearly for regular check ups.   Dietary issues and exercise activities  discussed: Current Exercise Habits: The patient does not participate in regular exercise at present (Stays active at home), Exercise limited by: orthopedic condition(s)  Goals    . declined          Patient declined goal at this time.       Depression Screen PHQ 2/9 Scores 12/06/2016 10/09/2016 09/01/2016 02/25/2016  PHQ - 2 Score 0 0 0 0    Fall Risk Fall Risk  12/06/2016 10/09/2016 09/01/2016 02/25/2016 08/26/2015  Falls in the past year? No No No No No    Cognitive Function:     6CIT Screen 12/06/2016  What Year? 0 points  What month? 0 points  What time? 0 points  Count back from 20 0 points  Months  in reverse 0 points  Repeat phrase 2 points  Total Score 2    Screening Tests Health Maintenance  Topic Date Due  . INFLUENZA VACCINE  11/08/2016  . COLONOSCOPY  04/11/2017 (Originally 03/22/2010)  . TETANUS/TDAP  12/06/2017 (Originally 03/23/1979)  . Hepatitis C Screening  Completed  . HIV Screening  Completed        Plan:  I have personally reviewed and addressed the Medicare Annual Wellness questionnaire and have noted the following in the patient's chart:  A. Medical and social history B. Use of alcohol, tobacco or illicit drugs  C. Current medications and supplements D. Functional ability and status E.  Nutritional status F.  Physical activity G. Advance directives H. List of other physicians I.  Hospitalizations, surgeries, and ER visits in previous 12 months J.  [place] such as hearing and vision if needed, cognitive and depression L. Referrals and appointments - none  In addition, I have reviewed and discussed with patient certain preventive protocols, quality metrics, and best practice recommendations. A written personalized care plan for preventive services as well as general preventive health recommendations were provided to patient.    Signed,  Andrez Grime, LPN Nurse Health Advisor   MD Recommendations: none

## 2017-01-02 ENCOUNTER — Other Ambulatory Visit: Payer: Self-pay | Admitting: Physician Assistant

## 2017-01-02 DIAGNOSIS — M1990 Unspecified osteoarthritis, unspecified site: Secondary | ICD-10-CM

## 2017-03-27 ENCOUNTER — Other Ambulatory Visit: Payer: Self-pay | Admitting: Physician Assistant

## 2017-03-27 DIAGNOSIS — M1990 Unspecified osteoarthritis, unspecified site: Secondary | ICD-10-CM

## 2017-03-27 DIAGNOSIS — E785 Hyperlipidemia, unspecified: Secondary | ICD-10-CM

## 2017-03-27 DIAGNOSIS — I10 Essential (primary) hypertension: Secondary | ICD-10-CM

## 2017-04-04 ENCOUNTER — Telehealth: Payer: Self-pay | Admitting: Physician Assistant

## 2017-04-04 ENCOUNTER — Other Ambulatory Visit: Payer: Self-pay | Admitting: *Deleted

## 2017-04-04 DIAGNOSIS — M1990 Unspecified osteoarthritis, unspecified site: Secondary | ICD-10-CM

## 2017-04-04 MED ORDER — ETODOLAC 400 MG PO TABS: ORAL_TABLET | ORAL | 0 refills | Status: DC

## 2017-04-04 NOTE — Telephone Encounter (Signed)
Called pt to reschedule his appt with Windell Hummingbird on 04/17/17. Judson Roch has had a change in her schedule so we will need to reschedule him.  When he calls back, please reschedule him for 04/24/17 or after.  Thanks!

## 2017-04-04 NOTE — Telephone Encounter (Signed)
Copied from [place] [phone]. Topic: Quick Communication - See Telephone Encounter >> Apr 04, 2017  2:41 PM Corie Chiquito, [place] wrote: CRM for notification. See Telephone encounter for: Patient needs a refill on his Lodine. If someone could give him a call back about this at [phone]  04/04/17.

## 2017-04-17 ENCOUNTER — Ambulatory Visit: Payer: PPO | Admitting: Physician Assistant

## 2017-05-02 ENCOUNTER — Ambulatory Visit: Payer: PPO | Admitting: Physician Assistant

## 2017-05-04 ENCOUNTER — Encounter: Payer: Self-pay | Admitting: Physician Assistant

## 2017-05-04 ENCOUNTER — Other Ambulatory Visit: Payer: Self-pay

## 2017-05-04 ENCOUNTER — Ambulatory Visit (INDEPENDENT_AMBULATORY_CARE_PROVIDER_SITE_OTHER): Payer: PPO | Admitting: Physician Assistant

## 2017-05-04 VITALS — BP 116/80 | HR 79 | Temp 98.0°F | Resp 18 | Ht 70.0 in | Wt 206.0 lb

## 2017-05-04 DIAGNOSIS — I10 Essential (primary) hypertension: Secondary | ICD-10-CM | POA: Diagnosis not present

## 2017-05-04 DIAGNOSIS — Z1211 Encounter for screening for malignant neoplasm of colon: Secondary | ICD-10-CM

## 2017-05-04 DIAGNOSIS — E782 Mixed hyperlipidemia: Secondary | ICD-10-CM

## 2017-05-04 LAB — LIPID PANEL
CHOL/HDL RATIO: 3.9 ratio (ref 0.0–5.0)
Cholesterol, Total: 220 mg/dL — ABNORMAL HIGH (ref 100–199)
HDL: 57 mg/dL (ref >39)
LDL CALC: 136 mg/dL — AB (ref 0–99)
TRIGLYCERIDES: 133 mg/dL (ref 0–149)
VLDL Cholesterol Cal: 27 mg/dL (ref 5–40)

## 2017-05-04 MED ORDER — LISINOPRIL 10 MG PO TABS: 10.0000 mg | ORAL_TABLET | Freq: Every day | ORAL | 1 refills | Status: DC

## 2017-05-04 MED ORDER — METOPROLOL SUCCINATE ER 50 MG PO TB24: ORAL_TABLET | ORAL | 1 refills | Status: DC
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
## 2017-05-04 NOTE — Patient Instructions (Signed)
     IF you received an x-ray today, you will receive an invoice from [facility]. Please contact [facility] at [phone] with questions or concerns regarding your invoice.   IF you received labwork today, you will receive an invoice from [facility]. Please contact [facility] at [phone] with questions or concerns regarding your invoice.   Our billing staff will not be able to assist you with questions regarding bills from these companies.  You will be contacted with the lab results as soon as they are available. The fastest way to get your results is to activate your My Chart account. Instructions are located on the last page of this paperwork. If you have not heard from us regarding the results in 2 weeks, please contact this office.     

## 2017-05-18 DIAGNOSIS — Z1212 Encounter for screening for malignant neoplasm of rectum: Secondary | ICD-10-CM | POA: Diagnosis not present

## 2017-05-18 DIAGNOSIS — Z1211 Encounter for screening for malignant neoplasm of colon: Secondary | ICD-10-CM | POA: Diagnosis not present

## 2017-05-28 LAB — COLOGUARD: Cologuard: NEGATIVE

## 2017-06-01 ENCOUNTER — Telehealth: Payer: Self-pay | Admitting: Physician Assistant

## 2017-06-01 NOTE — Telephone Encounter (Signed)
Copied from [place]. Topic: Quick Communication - See Telephone Encounter >> Jun 01, 2017  3:23 PM [place], Laban Emperor wrote: CRM for notification. See Telephone encounter for:  Pt would like a call about his lab results and his Cologuard test.  06/01/17.

## 2017-06-12 NOTE — Telephone Encounter (Signed)
Lm on vm to return office call. Dgaddy, CMA

## 2017-06-22 ENCOUNTER — Other Ambulatory Visit: Payer: Self-pay | Admitting: Physician Assistant

## 2017-06-22 DIAGNOSIS — E785 Hyperlipidemia, unspecified: Secondary | ICD-10-CM

## 2017-09-27 ENCOUNTER — Other Ambulatory Visit: Payer: Self-pay | Admitting: Physician Assistant

## 2017-09-27 DIAGNOSIS — E785 Hyperlipidemia, unspecified: Secondary | ICD-10-CM

## 2017-10-06 ENCOUNTER — Ambulatory Visit (INDEPENDENT_AMBULATORY_CARE_PROVIDER_SITE_OTHER): Payer: PPO

## 2017-10-06 ENCOUNTER — Other Ambulatory Visit: Payer: Self-pay

## 2017-10-06 ENCOUNTER — Ambulatory Visit (INDEPENDENT_AMBULATORY_CARE_PROVIDER_SITE_OTHER): Payer: PPO | Admitting: Family Medicine

## 2017-10-06 ENCOUNTER — Encounter: Payer: Self-pay | Admitting: Family Medicine

## 2017-10-06 VITALS — BP 136/86 | HR 72 | Temp 97.7°F | Resp 20 | Ht 69.41 in | Wt 196.2 lb

## 2017-10-06 DIAGNOSIS — R05 Cough: Secondary | ICD-10-CM | POA: Diagnosis not present

## 2017-10-06 DIAGNOSIS — R0602 Shortness of breath: Secondary | ICD-10-CM | POA: Diagnosis not present

## 2017-10-06 DIAGNOSIS — R062 Wheezing: Secondary | ICD-10-CM

## 2017-10-06 DIAGNOSIS — R059 Cough, unspecified: Secondary | ICD-10-CM

## 2017-10-06 DIAGNOSIS — J45901 Unspecified asthma with (acute) exacerbation: Secondary | ICD-10-CM | POA: Diagnosis not present

## 2017-10-06 MED ORDER — ALBUTEROL SULFATE (2.5 MG/3ML) 0.083% IN NEBU
2.5000 mg | INHALATION_SOLUTION | Freq: Once | RESPIRATORY_TRACT | Status: AC
  Administered 2017-10-06: 2.5 mg via RESPIRATORY_TRACT

## 2017-10-06 MED ORDER — BENZONATATE 100 MG PO CAPS: 100.0000 mg | ORAL_CAPSULE | Freq: Three times a day (TID) | ORAL | 0 refills | Status: DC | PRN

## 2017-10-06 MED ORDER — ALBUTEROL SULFATE HFA 108 (90 BASE) MCG/ACT IN AERS: 2.0000 | INHALATION_SPRAY | RESPIRATORY_TRACT | 1 refills | Status: DC | PRN

## 2017-10-06 MED ORDER — PREDNISONE 20 MG PO TABS: ORAL_TABLET | ORAL | 0 refills | Status: DC
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
## 2017-10-06 NOTE — Patient Instructions (Addendum)
  Drink plenty of fluids to stay well-hydrated.  Use the albuterol inhaler 2 puffs every 4-6 hours  Take benzonatate cough pills as needed  Take prednisone 20 mg 3 pills daily for 2 days, then 2 daily for 2 days, then 1 daily for 2 days.  Take the first dose today when you get it, but then other days take them after breakfast.  Return if worse  In the event of acute difficulty breathing go to the emergency room or call 911   IF you received an x-ray today, you will receive an invoice from [facility] Rad[facility]. Please contact [facility] at [phone] with questions or concerns regarding your invoice.   IF you received labwork today, you will receive an invoice from [place]. Please contact [facility] at [phone] with questions or concerns regarding your invoice.   Our billing staff will not be able to assist you with questions regarding bills from these companies.  You will be contacted with the lab results as soon as they are available. The fastest way to get your results is to activate your My Chart account. Instructions are located on the last page of this paperwork. If you have not heard from [place] regarding the results in 2 weeks, please contact this office.

## 2017-11-02 ENCOUNTER — Ambulatory Visit: Payer: PPO | Admitting: Physician Assistant

## 2017-11-13 ENCOUNTER — Ambulatory Visit: Payer: PPO | Admitting: Pulmonary Disease

## 2017-11-13 ENCOUNTER — Encounter: Payer: Self-pay | Admitting: Pulmonary Disease

## 2017-11-13 ENCOUNTER — Other Ambulatory Visit (INDEPENDENT_AMBULATORY_CARE_PROVIDER_SITE_OTHER): Payer: PPO

## 2017-11-13 VITALS — BP 128/74 | HR 73 | Ht 69.0 in | Wt 210.0 lb

## 2017-11-13 DIAGNOSIS — J849 Interstitial pulmonary disease, unspecified: Secondary | ICD-10-CM

## 2017-11-13 DIAGNOSIS — R059 Cough, unspecified: Secondary | ICD-10-CM

## 2017-11-13 DIAGNOSIS — R05 Cough: Secondary | ICD-10-CM

## 2017-11-13 LAB — CBC WITH DIFFERENTIAL/PLATELET
BASOS ABS: 0.1 10*3/uL (ref 0.0–0.1)
Basophils Relative: 1.2 % (ref 0.0–3.0)
EOS ABS: 0.2 10*3/uL (ref 0.0–0.7)
Eosinophils Relative: 3 % (ref 0.0–5.0)
HEMATOCRIT: 40.3 % (ref 39.0–52.0)
Hemoglobin: 13.3 g/dL (ref 13.0–17.0)
LYMPHS ABS: 1.8 10*3/uL (ref 0.7–4.0)
Lymphocytes Relative: 23.9 % (ref 12.0–46.0)
MCHC: 33 g/dL (ref 30.0–36.0)
MCV: 85.7 fl (ref 78.0–100.0)
Monocytes Absolute: 0.6 10*3/uL (ref 0.1–1.0)
Monocytes Relative: 8.6 % (ref 3.0–12.0)
NEUTROS ABS: 4.7 10*3/uL (ref 1.4–7.7)
NEUTROS PCT: 63.3 % (ref 43.0–77.0)
PLATELETS: 360 10*3/uL (ref 150.0–400.0)
RBC: 4.7 Mil/uL (ref 4.22–5.81)
RDW: 13.3 % (ref 11.5–15.5)
WBC: 7.4 10*3/uL (ref 4.0–10.5)

## 2017-11-13 LAB — NITRIC OXIDE: NITRIC OXIDE: 16

## 2017-11-13 NOTE — Progress Notes (Signed)
MILLEDGE GERDING    [id number]    1960-08-14  Primary Care Physician:Weber, Damaris Hippo, PA-C  Referring Physician: Mancel Bale, PA-C [address] [place], [place] [address]  Chief complaint: Consult for cough  HPI: 57 year old with history of reactive airway disease, asthma, hypertension.  Evaluated in the pulmonary clinic for dyspnea Complains of cough for the past couple of years, nonproductive in nature.  No dyspnea, wheezing, mucus production, fevers, chills He was seen in urgent care and treated with prednisone with improvement in symptoms.  He was told that he may have asthma Has albuterol inhaler which he uses several times a week.  No nocturnal awakenings  History noted for pulmonary fibrosis with father and brother.  His brother has IPF and is being followed by Dr. Chase Caller in the ILD clinic  Pets: Has a cat, no birds, farm animals Occupation: He works at an [employer].  Currently disabled after femur fracture Exposures: No known exposures, no mold, hot tub, Jacuzzi Smoking history: Never smoked Travel history: Grew up in [place], No significant travel. Relevant family history: Father had unspecified fibrosis.  Brother has IPF.  Outpatient Encounter Medications as of 11/13/2017  Medication Sig  . albuterol (PROVENTIL HFA;VENTOLIN HFA) 108 (90 Base) MCG/ACT inhaler Inhale 2 puffs into the lungs every 4 (four) hours as needed for wheezing or shortness of breath (cough, shortness of breath or wheezing.).  Marland Kitchen chlorpheniramine (CHLOR-TRIMETON) 4 MG tablet Take 4 mg by mouth 2 (two) times daily as needed for allergies.  Marland Kitchen DEXAMETHASONE PO Take 0.5 tablets by mouth daily as needed (for back and hip pain).   Marland Kitchen lisinopril (PRINIVIL,ZESTRIL) 10 MG tablet Take 1 tablet (10 mg total) by mouth daily.  . metoprolol succinate (TOPROL-XL) 50 MG 24 hr tablet Take with or immediately following a meal. (Patient taking differently: Take 50 mg by mouth daily. Take with or immediately  following a meal.)  . pravastatin (PRAVACHOL) 80 MG tablet TAKE 1 TABLET BY MOUTH EVERY DAY  . etodolac (LODINE) 400 MG tablet TAKE 1 TABLET(400 MG) BY MOUTH DAILY (Patient not taking: Reported on 11/13/2017)  . [DISCONTINUED] benzonatate (TESSALON) 100 MG capsule Take 1-2 capsules (100-200 mg total) by mouth 3 (three) times daily as needed. (Patient not taking: Reported on 11/13/2017)  . [DISCONTINUED] predniSONE (DELTASONE) 20 MG tablet Take 3 daily for 2 days, then 2 daily for 2 days, then 1 daily for 2 days.  Best taken after breakfast. (Patient not taking: Reported on 11/13/2017)   No facility-administered encounter medications on file as of 11/13/2017.     Allergies as of 11/13/2017  . (No Known Allergies)    Past Medical History:  Diagnosis Date  . Back pain   . Hypertension     Past Surgical History:  Procedure Laterality Date  . FRACTURE SURGERY    . JOINT REPLACEMENT      Family History  Problem Relation Age of Onset  . Pulmonary fibrosis Father   . Pulmonary fibrosis Brother     Social History   Socioeconomic History  . Marital status: Single    Spouse name: Not on file  . Number of children: Not on file  . Years of education: Not on file  . Highest education level: Not on file  Occupational History  . Not on file  Social Needs  . Financial resource strain: Not on file  . Food insecurity:    Worry: Not on file    Inability:  Not on file  . Transportation needs:    Medical: Not on file    Non-medical: Not on file  Tobacco Use  . Smoking status: Never Smoker  . Smokeless tobacco: Never Used  Substance and Sexual Activity  . Alcohol use: Yes    Alcohol/week: 0.6 - 1.2 oz    Types: 1 - 2 Cans of beer per week  . Drug use: No  . Sexual activity: Not Currently  Lifestyle  . Physical activity:    Days per week: Not on file    Minutes per session: Not on file  . Stress: Not on file  Relationships  . Social connections:    Talks on phone: Not on file    Gets  together: Not on file    Attends religious service: Not on file    Active member of club or organization: Not on file    Attends meetings of clubs or organizations: Not on file    Relationship status: Not on file  . Intimate partner violence:    Fear of current or ex partner: Not on file    Emotionally abused: Not on file    Physically abused: Not on file    Forced sexual activity: Not on file  Other Topics Concern  . Not on file  Social History Narrative  . Not on file    Review of systems: Review of Systems  Constitutional: Negative for fever and chills.  HENT: Negative.   Eyes: Negative for blurred vision.  Respiratory: as per HPI  Cardiovascular: Negative for chest pain and palpitations.  Gastrointestinal: Negative for vomiting, diarrhea, blood per rectum. Genitourinary: Negative for dysuria, urgency, frequency and hematuria.  Musculoskeletal: Negative for myalgias, back pain and joint pain.  Skin: Negative for itching and rash.  Neurological: Negative for dizziness, tremors, focal weakness, seizures and loss of consciousness.  Endo/Heme/Allergies: Negative for environmental allergies.  Psychiatric/Behavioral: Negative for depression, suicidal ideas and hallucinations.  All other systems reviewed and are negative.  Physical Exam: Blood pressure 128/74, pulse 73, height 5\' 9"  (1.753 m), weight 210 lb (95.3 kg), SpO2 98 %. Gen:      No acute distress HEENT:  EOMI, sclera anicteric Neck:     No masses; no thyromegaly Lungs:    Clear to auscultation bilaterally; normal respiratory effort CV:         Regular rate and rhythm; no murmurs Abd:      + bowel sounds; soft, non-tender; no palpable masses, no distension Ext:    No edema; adequate peripheral perfusion Skin:      Warm and dry; no rash Neuro: alert and oriented x 3 Psych: normal mood and affect  Data Reviewed: Chest x-ray 10/06/2017- mild peribronchial thickening, minimal scoliosis, old healed rib fractures.  I have  reviewed the images personally.  FENO 11/13/2017-16  Assessment:  Evaluation for cough Suspect this is secondary to ACE inhibitor as symptoms started around the time the lisinopril was started Evaluate for reactive airway disease, asthma, interstitial lung disease [given strong family history of pulmonary fibrosis]  Schedule pulmonary function test, high-resolution CT of the chest to evaluate interstitial lung disease Check CBC differential, IgE  Advised him to stop the lisinopril.  He will discuss with primary care doctor. Continue the albuterol inhaler for now Follow-up in 1 to 2 months.  Plan/Recommendations: - PFTs, CT, CBC, IgE - Stop lisinopril  Marshell Garfinkel MD [facility] 11/13/2017, 9:48 AM  CC: Mancel Bale, PA-C

## 2017-11-13 NOTE — Addendum Note (Signed)
Addended by: Jannette Spanner on: 11/13/2017 10:18 AM   Modules accepted: Orders

## 2017-11-13 NOTE — Patient Instructions (Signed)
Schedule you for pulmonary function test, high-resolution CT of the chest to evaluate interstitial lung disease Check CBC differential, IgE Please talk to your primary care doctor about stopping the lisinopril Continue the albuterol inhaler for now Follow-up in 1 to 2 months.

## 2017-11-14 LAB — IGE: IgE (Immunoglobulin E), Serum: 17 kU/L (ref <=114)

## 2017-11-21 ENCOUNTER — Telehealth: Payer: Self-pay | Admitting: Physician Assistant

## 2017-11-21 NOTE — Telephone Encounter (Signed)
LVM to confirm appt with Weber tomorrow at 120

## 2017-11-22 ENCOUNTER — Ambulatory Visit (INDEPENDENT_AMBULATORY_CARE_PROVIDER_SITE_OTHER)
Admission: RE | Admit: 2017-11-22 | Discharge: 2017-11-22 | Disposition: A | Discharge status: Home / Self Care | Payer: PPO | Source: Ambulatory Visit | Attending: Pulmonary Disease | Admitting: Pulmonary Disease

## 2017-11-22 ENCOUNTER — Ambulatory Visit (INDEPENDENT_AMBULATORY_CARE_PROVIDER_SITE_OTHER): Payer: PPO | Admitting: Physician Assistant

## 2017-11-22 ENCOUNTER — Other Ambulatory Visit: Payer: Self-pay | Admitting: Physician Assistant

## 2017-11-22 ENCOUNTER — Other Ambulatory Visit: Payer: Self-pay

## 2017-11-22 ENCOUNTER — Encounter: Payer: Self-pay | Admitting: Physician Assistant

## 2017-11-22 VITALS — BP 126/74 | HR 78 | Temp 98.5°F | Resp 20 | Ht 69.09 in | Wt 192.4 lb

## 2017-11-22 DIAGNOSIS — I10 Essential (primary) hypertension: Secondary | ICD-10-CM

## 2017-11-22 DIAGNOSIS — R05 Cough: Secondary | ICD-10-CM

## 2017-11-22 DIAGNOSIS — R059 Cough, unspecified: Secondary | ICD-10-CM

## 2017-11-22 DIAGNOSIS — J849 Interstitial pulmonary disease, unspecified: Secondary | ICD-10-CM | POA: Diagnosis not present

## 2017-11-22 DIAGNOSIS — J929 Pleural plaque without asbestos: Secondary | ICD-10-CM | POA: Diagnosis not present

## 2017-11-22 MED ORDER — OLMESARTAN MEDOXOMIL 20 MG PO TABS: 20.0000 mg | ORAL_TABLET | Freq: Every day | ORAL | 0 refills | Status: DC

## 2017-11-22 NOTE — Telephone Encounter (Signed)
Pt. seen in office and started on new medication today; Benicar.  Is requesting a 90 day supply.
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
## 2017-11-22 NOTE — Patient Instructions (Addendum)
Jesse Bell or Jesse Bell  We changed to benicar - if you have trouble with backorder and getting it let me know and I will change     I will contact you with your lab results within the next 2 weeks.  If you have not heard from [place] then please contact us. The fastest way to get your results is to register for My Chart.   IF you received an x-ray today, you will receive an invoice from [facility] Radiology. Please contact [facility] Radiology at [phone] with questions or concerns regarding your invoice.   IF you received labwork today, you will receive an invoice from [place]. Please contact [facility] at [phone] with questions or concerns regarding your invoice.   Our billing staff will not be able to assist you with questions regarding bills from these companies.  You will be contacted with the lab results as soon as they are available. The fastest way to get your results is to activate your My Chart account. Instructions are located on the last page of this paperwork. If you have not heard from [place] regarding the results in 2 weeks, please contact this office.

## 2017-11-22 NOTE — Telephone Encounter (Signed)
Please advise/refill: olmesartan (BENICAR) 20 MG tablet  Pt was seen today

## 2017-11-23 ENCOUNTER — Encounter: Payer: Self-pay | Admitting: Physician Assistant

## 2017-11-23 NOTE — Telephone Encounter (Signed)
I am fine doing this but this does not allow him to see if he loses his side effects and to make sure it works - we talked about this in the clinic but I will send in a 90d supply

## 2017-11-28 ENCOUNTER — Other Ambulatory Visit: Payer: Self-pay | Admitting: Pulmonary Disease

## 2017-11-28 DIAGNOSIS — R918 Other nonspecific abnormal finding of lung field: Secondary | ICD-10-CM

## 2017-12-19 ENCOUNTER — Other Ambulatory Visit: Payer: Self-pay | Admitting: Physician Assistant

## 2017-12-19 ENCOUNTER — Ambulatory Visit (INDEPENDENT_AMBULATORY_CARE_PROVIDER_SITE_OTHER): Payer: PPO | Admitting: Physician Assistant

## 2017-12-19 ENCOUNTER — Other Ambulatory Visit: Payer: Self-pay

## 2017-12-19 ENCOUNTER — Encounter: Payer: Self-pay | Admitting: Physician Assistant

## 2017-12-19 VITALS — BP 103/69 | HR 103 | Temp 98.0°F | Resp 18 | Ht 69.09 in | Wt 190.0 lb

## 2017-12-19 DIAGNOSIS — R05 Cough: Secondary | ICD-10-CM

## 2017-12-19 DIAGNOSIS — R059 Cough, unspecified: Secondary | ICD-10-CM

## 2017-12-19 DIAGNOSIS — G479 Sleep disorder, unspecified: Secondary | ICD-10-CM

## 2017-12-19 DIAGNOSIS — I10 Essential (primary) hypertension: Secondary | ICD-10-CM

## 2017-12-19 MED ORDER — TRAZODONE HCL 50 MG PO TABS: 25.0000 mg | ORAL_TABLET | Freq: Every evening | ORAL | 1 refills | Status: DC | PRN
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
## 2017-12-19 NOTE — Patient Instructions (Addendum)
  To start the Trazodone - start with 1/2 pill at night for the 1st 4 nights - increase the dose by 25mg  (1/2 pill) every 4-5 nights until either you sleep well, have side effects or reach a nightly dose of 150mg   [place] or [place]  If you have lab work done today you will be contacted with your lab results within the next 2 weeks.  If you have not heard from [place] then please contact us. The fastest way to get your results is to register for My Chart.   IF you received an x-ray today, you will receive an invoice from [facility]. Please contact [facility] at [phone] with questions or concerns regarding your invoice.   IF you received labwork today, you will receive an invoice from [facility]. Please contact [facility] at [phone] with questions or concerns regarding your invoice.   Our billing staff will not be able to assist you with questions regarding bills from these companies.  You will be contacted with the lab results as soon as they are available. The fastest way to get your results is to activate your My Chart account. Instructions are located on the last page of this paperwork. If you have not heard from [place] regarding the results in 2 weeks, please contact this office.

## 2017-12-22 ENCOUNTER — Other Ambulatory Visit: Payer: Self-pay | Admitting: Physician Assistant

## 2017-12-22 DIAGNOSIS — I10 Essential (primary) hypertension: Secondary | ICD-10-CM

## 2017-12-22 DIAGNOSIS — E785 Hyperlipidemia, unspecified: Secondary | ICD-10-CM

## 2017-12-31 ENCOUNTER — Encounter: Payer: Self-pay | Admitting: Pulmonary Disease

## 2017-12-31 ENCOUNTER — Ambulatory Visit (INDEPENDENT_AMBULATORY_CARE_PROVIDER_SITE_OTHER): Payer: PPO | Admitting: Pulmonary Disease

## 2017-12-31 ENCOUNTER — Other Ambulatory Visit: Payer: Self-pay | Admitting: Pulmonary Disease

## 2017-12-31 VITALS — BP 128/72 | HR 75 | Ht 69.0 in | Wt 198.0 lb

## 2017-12-31 DIAGNOSIS — R05 Cough: Secondary | ICD-10-CM | POA: Diagnosis not present

## 2017-12-31 DIAGNOSIS — J849 Interstitial pulmonary disease, unspecified: Secondary | ICD-10-CM

## 2017-12-31 DIAGNOSIS — R059 Cough, unspecified: Secondary | ICD-10-CM

## 2017-12-31 LAB — PULMONARY FUNCTION TEST
DL/VA % PRED: 106 %
DL/VA: 4.84 ml/min/mmHg/L
DLCO UNC % PRED: 97 %
DLCO cor % pred: 101 %
DLCO cor: 31.35 ml/min/mmHg
DLCO unc: 30.14 ml/min/mmHg
FEF 25-75 PRE: 2.31 L/s
FEF 25-75 Post: 3.2 L/sec
FEF2575-%CHANGE-POST: 38 %
FEF2575-%Pred-Post: 107 %
FEF2575-%Pred-Pre: 77 %
FEV1-%Change-Post: 11 %
FEV1-%PRED-PRE: 87 %
FEV1-%Pred-Post: 97 %
FEV1-PRE: 3.13 L
FEV1-Post: 3.47 L
FEV1FVC-%CHANGE-POST: 2 %
FEV1FVC-%PRED-PRE: 94 %
FEV6-%CHANGE-POST: 10 %
FEV6-%Pred-Post: 106 %
FEV6-%Pred-Pre: 96 %
FEV6-Post: 4.75 L
FEV6-Pre: 4.3 L
FEV6FVC-%Change-Post: 0 %
FEV6FVC-%PRED-POST: 103 %
FEV6FVC-%PRED-PRE: 103 %
FVC-%Change-Post: 8 %
FVC-%PRED-POST: 101 %
FVC-%Pred-Pre: 93 %
FVC-POST: 4.76 L
FVC-PRE: 4.37 L
PRE FEV1/FVC RATIO: 71 %
Post FEV1/FVC ratio: 73 %
Post FEV6/FVC ratio: 100 %
Pre FEV6/FVC Ratio: 99 %
RV % PRED: 102 %
RV: 2.2 L
TLC % PRED: 98 %
TLC: 6.7 L

## 2017-12-31 MED ORDER — OMEPRAZOLE 20 MG PO CPDR: 20.0000 mg | DELAYED_RELEASE_CAPSULE | Freq: Every day | ORAL | 1 refills | Status: DC

## 2017-12-31 MED ORDER — CHLORPHENIRAMINE MALEATE 4 MG PO TABS: 8.0000 mg | ORAL_TABLET | Freq: Three times a day (TID) | ORAL | 0 refills | Status: DC

## 2017-12-31 NOTE — Progress Notes (Signed)
Jesse Bell    [id number]    [birth year]  Primary Care Physician:Patient, No Pcp Per  Referring Physician: Mancel Bale, PA-C [address] [place], [place] [address]  Chief complaint: Consult for cough  HPI: 57 year old with history of reactive airway disease, asthma, hypertension.  Evaluated in the pulmonary clinic for dyspnea Complains of cough for the past couple of years, nonproductive in nature.  No dyspnea, wheezing, mucus production, fevers, chills He was seen in urgent care and treated with prednisone with improvement in symptoms.  He was told that he may have asthma Has albuterol inhaler which he uses several times a week.  No nocturnal awakenings  History noted for pulmonary fibrosis with father and brother.  His brother has IPF and is being followed by Dr. Chase Caller in the ILD clinic  Pets: Has a cat, no birds, farm animals Occupation: He works at an [employer].  Currently disabled after femur fracture Exposures: No known exposures, no mold, hot tub, Jacuzzi Smoking history: Never smoked Travel history: Grew up in [place], No significant travel. Relevant family history: Father had unspecified fibrosis.  Brother has IPF.  Interim history: Currently off lisinopril.  States that his cough is unchanged No new complaints today.  Outpatient Encounter Medications as of 12/31/2017  Medication Sig  . albuterol (PROVENTIL HFA;VENTOLIN HFA) 108 (90 Base) MCG/ACT inhaler Inhale 2 puffs into the lungs every 4 (four) hours as needed for wheezing or shortness of breath (cough, shortness of breath or wheezing.).  Marland Kitchen chlorpheniramine (CHLOR-TRIMETON) 4 MG tablet Take 4 mg by mouth 2 (two) times daily as needed for allergies.  Marland Kitchen DEXAMETHASONE PO Take 0.5 tablets by mouth daily as needed (for back and hip pain).   Marland Kitchen etodolac (LODINE) 400 MG tablet TAKE 1 TABLET(400 MG) BY MOUTH DAILY  . metoprolol succinate (TOPROL-XL) 50 MG 24 hr tablet Take 1 tablet (50 mg total) by mouth  daily. Take with or immediately following a meal.  . olmesartan (BENICAR) 20 MG tablet TAKE 1 TABLET(20 MG) BY MOUTH DAILY  . pravastatin (PRAVACHOL) 80 MG tablet TAKE 1 TABLET BY MOUTH EVERY DAY  . traZODone (DESYREL) 50 MG tablet Take 0.5-3 tablets (25-150 mg total) by mouth at bedtime as needed for sleep.   No facility-administered encounter medications on file as of 12/31/2017.    Physical Exam: Blood pressure 128/72, pulse 75, height 5\' 9"  (1.753 m), weight 198 lb (89.8 kg), SpO2 97 %. Gen:      No acute distress HEENT:  EOMI, sclera anicteric Neck:     No masses; no thyromegaly Lungs:    Clear to auscultation bilaterally; normal respiratory effort CV:         Regular rate and rhythm; no murmurs Abd:      + bowel sounds; soft, non-tender; no palpable masses, no distension Ext:    No edema; adequate peripheral perfusion Skin:      Warm and dry; no rash Neuro: alert and oriented x 3 Psych: normal mood and affect  Data Reviewed Imaging Chest x-ray 10/06/2017- mild peribronchial thickening, minimal scoliosis, old healed rib fractures. CT 11/22/2017-no interstitial lung disease, bronchial wall thickening, 5 mm right lower lobe nodule. I reviewed the images  PFTs  12/31/2017 FVC 4.76 [101%), FEV1 2.47 [97%), F/F 73, TLC 98%, DLCO 97% Minimal obstructive airway disease.  FENO 11/13/2017-16  Assessment:  Follow-up for cough Likely secondary to ACE inhibitor, postnasal drip, GERD Now off ACE inhibitor. PFTs show very minimal  obstruction, no evidence of interstitial lung disease though he has strong family history of pulmonary fibrosis.    Rhinitis, postnasal drip Try Chlor-Trimeton, Flonase nasal spray  GERD Start Prilosec  Plan/Recommendations: - Chlor-Trimeton, Flonase - Prilosec  Marshell Garfinkel MD [facility] 12/31/2017, 4:25 PM  CC: Mancel Bale, PA-C

## 2017-12-31 NOTE — Patient Instructions (Signed)
I reviewed your CT which no evidence of lung fibrosis and interstitial lung disease which is good news Your pulmonary function test look good as well  Try Chlor-Trimeton 8 mg 3 times daily and Flonase nasal spray Start Prilosec 20 mg twice daily for 2 weeks Follow-up in 1 to 2 months.

## 2017-12-31 NOTE — Progress Notes (Signed)
PFT done today. 

## 2018-02-04 ENCOUNTER — Ambulatory Visit: Payer: PPO | Admitting: Pulmonary Disease

## 2018-02-08 ENCOUNTER — Ambulatory Visit (INDEPENDENT_AMBULATORY_CARE_PROVIDER_SITE_OTHER): Payer: PPO | Admitting: Pulmonary Disease

## 2018-02-08 ENCOUNTER — Encounter: Payer: Self-pay | Admitting: Pulmonary Disease

## 2018-02-08 VITALS — BP 130/72 | HR 75 | Ht 69.0 in | Wt 214.0 lb

## 2018-02-08 DIAGNOSIS — R05 Cough: Secondary | ICD-10-CM

## 2018-02-08 DIAGNOSIS — Z23 Encounter for immunization: Secondary | ICD-10-CM

## 2018-02-08 DIAGNOSIS — R059 Cough, unspecified: Secondary | ICD-10-CM

## 2018-02-08 MED ORDER — BUDESONIDE-FORMOTEROL FUMARATE 160-4.5 MCG/ACT IN AERO: 2.0000 | INHALATION_SPRAY | Freq: Two times a day (BID) | RESPIRATORY_TRACT | 5 refills | Status: DC

## 2018-02-08 MED ORDER — BUDESONIDE-FORMOTEROL FUMARATE 160-4.5 MCG/ACT IN AERO: 2.0000 | INHALATION_SPRAY | Freq: Two times a day (BID) | RESPIRATORY_TRACT | 0 refills | Status: DC

## 2018-02-08 NOTE — Progress Notes (Signed)
JEFFERY BACHMEIER    [id number]    [birth year]  Primary Care Physician:Patient, No Pcp Per  Referring Physician: No referring provider defined for this encounter.  Chief complaint: Follow up for cough  HPI: 57 year old with history of reactive airway disease, asthma, hypertension.  Evaluated in the pulmonary clinic for dyspnea. Complains of cough for the past couple of years, nonproductive in nature.  No dyspnea, wheezing, mucus production, fevers, chills. He was seen in urgent care and treated with prednisone with improvement in symptoms.  He was told that he may have asthma. Has albuterol inhaler which he uses several times a week.  No nocturnal awakenings  History noted for pulmonary fibrosis with father and brother.  His brother has IPF and is being followed by Dr. Chase Caller in the ILD clinic  Pets: Has a cat, no birds, farm animals Occupation: He works at an [employer].  Currently disabled after femur fracture Exposures: No known exposures, no mold, hot tub, Jacuzzi Smoking history: Never smoked Travel history: Grew up in [place], No significant travel. Relevant family history: Father had unspecified fibrosis.  Brother has IPF.  Interim history: Currently off lisinopril.  States that his cough is unchanged The only thing that helps is the chlorpheniramine States that breathing is stable with no dyspnea on exertion, wheezing.  Outpatient Encounter Medications as of 02/08/2018  Medication Sig  . albuterol (PROVENTIL HFA;VENTOLIN HFA) 108 (90 Base) MCG/ACT inhaler Inhale 2 puffs into the lungs every 4 (four) hours as needed for wheezing or shortness of breath (cough, shortness of breath or wheezing.).  Marland Kitchen chlorpheniramine (CHLOR-TRIMETON) 4 MG tablet Take 8 mg by mouth 3 (three) times daily.   Marland Kitchen DEXAMETHASONE PO Take 0.5 tablets by mouth daily as needed (for back and hip pain).   Marland Kitchen etodolac (LODINE) 400 MG tablet TAKE 1 TABLET(400 MG) BY MOUTH DAILY  . metoprolol succinate  (TOPROL-XL) 50 MG 24 hr tablet Take 1 tablet (50 mg total) by mouth daily. Take with or immediately following a meal.  . olmesartan (BENICAR) 20 MG tablet TAKE 1 TABLET(20 MG) BY MOUTH DAILY  . omeprazole (PRILOSEC) 20 MG capsule TAKE 1 CAPSULE(20 MG) BY MOUTH DAILY  . pravastatin (PRAVACHOL) 80 MG tablet TAKE 1 TABLET BY MOUTH EVERY DAY  . traZODone (DESYREL) 50 MG tablet Take 0.5-3 tablets (25-150 mg total) by mouth at bedtime as needed for sleep.  . [DISCONTINUED] chlorpheniramine (CHLOR-TRIMETON) 4 MG tablet Take 2 tablets (8 mg total) by mouth 3 (three) times daily.   No facility-administered encounter medications on file as of 02/08/2018.    Physical Exam: Blood pressure 130/72, pulse 75, height 5\' 9"  (1.753 m), weight 214 lb (97.1 kg), SpO2 97 %. Gen:      No acute distress HEENT:  EOMI, sclera anicteric Neck:     No masses; no thyromegaly Lungs:    Clear to auscultation bilaterally; normal respiratory effort CV:         Regular rate and rhythm; no murmurs Abd:      + bowel sounds; soft, non-tender; no palpable masses, no distension Ext:    No edema; adequate peripheral perfusion Skin:      Warm and dry; no rash Neuro: alert and oriented x 3 Psych: normal mood and affect  Data Reviewed Imaging Chest x-ray 10/06/2017- mild peribronchial thickening, minimal scoliosis, old healed rib fractures. CT 11/22/2017-no interstitial lung disease, bronchial wall thickening, 5 mm right lower lobe nodule. I reviewed the images  PFTs  12/31/2017 FVC 4.76 [101%), FEV1 2.47 [97%), F/F 73, TLC 98%, DLCO 97% Minimal obstructive airway disease.  FENO 11/13/2017-16  Assessment:  Follow-up for cough Likely secondary to ACE inhibitor, postnasal drip, GERD Now off ACE inhibitor. PFTs show very minimal obstruction, no evidence of interstitial lung disease though he has strong family history of pulmonary fibrosis.    Continues to have symptoms of mild cough.  Will give him a trial of inhaler to see if  this helps Start Symbicort 160/4.5.  Reassess in 2 to 3 months.  Rhinitis, postnasal drip Continue Chlor-Trimeton, Flonase nasal spray  GERD Continiue Prilosec  Health maintenance Flu vaccine today  Plan/Recommendations: - Chlor-Trimeton, Flonase - Prilosec - Start Symbicort - Flu vaccination  Marshell Garfinkel MD [facility] 02/08/2018, 11:53 AM  CC: No ref. provider found

## 2018-02-08 NOTE — Patient Instructions (Signed)
Continue using your antiacid medication and allergy medication We will start you on an inhaler called Symbicort 160/4.5.  Use this twice daily Follow-up in 3 months  We will give a flu vaccine today.

## 2018-02-22 ENCOUNTER — Other Ambulatory Visit: Payer: Self-pay | Admitting: Physician Assistant

## 2018-02-22 DIAGNOSIS — I10 Essential (primary) hypertension: Secondary | ICD-10-CM

## 2018-02-22 MED ORDER — OLMESARTAN MEDOXOMIL 20 MG PO TABS: ORAL_TABLET | ORAL | 0 refills | Status: DC

## 2018-02-22 NOTE — Telephone Encounter (Signed)
Copied from [place] [phone] [id number]. Topic: Quick Communication - Rx Refill/Question >> Feb 22, 2018  1:04 PM Sheran Luz wrote: Medication:  olmesartan (BENICAR) 20 MG tablet   Patient is requesting a refill of this medication. Patient states he has one tablet left.    Preferred Pharmacy (with phone number or street name): [id number] - [place], [place] - [address] [phone] (Phone) [phone] (Fax)

## 2018-04-19 ENCOUNTER — Other Ambulatory Visit: Payer: Self-pay | Admitting: Family Medicine

## 2018-04-19 DIAGNOSIS — E785 Hyperlipidemia, unspecified: Secondary | ICD-10-CM

## 2018-04-19 MED ORDER — PRAVASTATIN SODIUM 80 MG PO TABS: 80.0000 mg | ORAL_TABLET | Freq: Every day | ORAL | 0 refills | Status: DC

## 2018-04-19 NOTE — Telephone Encounter (Signed)
Copied from [place] [phone]. Topic: Quick Communication - Rx Refill/Question >> Apr 19, 2018  2:28 PM Judyann Munson wrote: Medication: pravastatin (PRAVACHOL) 80 MG tablet  Has the patient contacted their pharmacy? yes  Preferred Pharmacy (with phone number or street name): [id number] - [place], [place] - [address] [phone] (Phone) [phone] (Fax)    Agent: Please be advised that RX refills may take up to 3 business days. We ask that you follow-up with your pharmacy.

## 2018-04-19 NOTE — Telephone Encounter (Signed)
Requested Prescriptions  Pending Prescriptions Disp Refills  . pravastatin (PRAVACHOL) 80 MG tablet 90 tablet 0    Sig: Take 1 tablet (80 mg total) by mouth daily.     Cardiovascular:  Antilipid - Statins Failed - 04/19/2018  2:32 PM      Failed - Total Cholesterol in normal range and within 360 days    Cholesterol, Total  Date Value Ref Range Status  05/04/2017 220 (H) 100 - 199 mg/dL Final         Failed - LDL in normal range and within 360 days    LDL Calculated  Date Value Ref Range Status  05/04/2017 136 (H) 0 - 99 mg/dL Final         Passed - HDL in normal range and within 360 days    HDL  Date Value Ref Range Status  05/04/2017 57 >39 mg/dL Final         Passed - Triglycerides in normal range and within 360 days    Triglycerides  Date Value Ref Range Status  05/04/2017 133 0 - 149 mg/dL Final         Passed - Patient is not pregnant      Passed - Valid encounter within last 12 months    Recent Outpatient Visits          4 months ago Essential hypertension   [facility], Damaris Hippo, PA-C   4 months ago Essential hypertension   [facility], Damaris Hippo, PA-C   6 months ago Cough   [facility] at [facility], Fenton Malling, MD   11 months ago Essential hypertension   [facility], Damaris Hippo, PA-C   1 year ago Essential hypertension   [facility], Damaris Hippo, PA-C      Future Appointments            In 3 weeks Forrest Moron, MD [facility] at [place], [facility]   In 1 month Marshell Garfinkel, MD [facility]

## 2018-05-16 ENCOUNTER — Other Ambulatory Visit: Payer: Self-pay

## 2018-05-16 ENCOUNTER — Encounter: Payer: Self-pay | Admitting: Family Medicine

## 2018-05-16 ENCOUNTER — Ambulatory Visit (INDEPENDENT_AMBULATORY_CARE_PROVIDER_SITE_OTHER): Payer: PPO | Admitting: Family Medicine

## 2018-05-16 VITALS — BP 115/74 | HR 86 | Temp 98.2°F | Resp 20 | Ht 69.17 in | Wt 207.8 lb

## 2018-05-16 DIAGNOSIS — J45901 Unspecified asthma with (acute) exacerbation: Secondary | ICD-10-CM

## 2018-05-16 DIAGNOSIS — D509 Iron deficiency anemia, unspecified: Secondary | ICD-10-CM

## 2018-05-16 DIAGNOSIS — M1990 Unspecified osteoarthritis, unspecified site: Secondary | ICD-10-CM | POA: Diagnosis not present

## 2018-05-16 DIAGNOSIS — E782 Mixed hyperlipidemia: Secondary | ICD-10-CM

## 2018-05-16 DIAGNOSIS — K219 Gastro-esophageal reflux disease without esophagitis: Secondary | ICD-10-CM

## 2018-05-16 DIAGNOSIS — I10 Essential (primary) hypertension: Secondary | ICD-10-CM | POA: Diagnosis not present

## 2018-05-16 MED ORDER — OMEPRAZOLE 20 MG PO CPDR: DELAYED_RELEASE_CAPSULE | ORAL | 1 refills | Status: DC

## 2018-05-16 MED ORDER — METOPROLOL SUCCINATE ER 50 MG PO TB24: 50.0000 mg | ORAL_TABLET | Freq: Every day | ORAL | 1 refills | Status: DC

## 2018-05-16 MED ORDER — ETODOLAC 400 MG PO TABS: ORAL_TABLET | ORAL | 0 refills | Status: DC

## 2018-05-16 MED ORDER — OLMESARTAN MEDOXOMIL 20 MG PO TABS: ORAL_TABLET | ORAL | 1 refills | Status: DC

## 2018-05-16 NOTE — Progress Notes (Signed)
Established Patient Office Visit  Subjective:  Patient ID: Jesse Bell, male    DOB: 1960-09-18  Age: 58 y.o. MRN: [id number]  CC:  Chief Complaint  Patient presents with  . Establish Care  . Medication Refill     Toprol-Xl, Benicar, and Pravachol    HPI Jaqua A Crager presents for  Hypertension: Patient here for follow-up of elevated blood pressure. He is not exercising and is adherent to low salt diet.  Blood pressure is well controlled at home. Cardiac symptoms none. Patient denies chest pain, chest pressure/discomfort, claudication, exertional chest pressure/discomfort, fatigue, irregular heart beat and near-syncope.  Cardiovascular risk factors: dyslipidemia, hypertension, male gender and sedentary lifestyle. Use of agents associated with hypertension: none. History of target organ damage: none. 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 Back pain   . Hypertension     Past Surgical History:  Procedure Laterality Date  . FRACTURE SURGERY    . JOINT REPLACEMENT      Family History  Problem Relation Age of Onset  . Pulmonary fibrosis Father   . Pulmonary fibrosis Brother     Social History    Socioeconomic History  . Marital status: Divorced    Spouse name: Not on file  . Number of children: 1  . Years of education: Not on file  . Highest education level: Not on file  Occupational History  . Not on file  Social Needs  . Financial resource strain: Not on file  . Food insecurity:    Worry: Not on file    Inability: Not on file  . Transportation needs:    Medical: Not on file    Non-medical: Not on file  Tobacco Use  . Smoking status: Never Smoker  . Smokeless tobacco: Never Used  Substance and Sexual Activity  . Alcohol use: Yes    Alcohol/week: 1.0 - 2.0 standard drinks    Types: 1 - 2 Cans of beer per week  . Drug use: No  . Sexual activity: Not Currently  Lifestyle  . Physical activity:    Days per week: Not on file    Minutes per session: Not on file  . Stress: Not on file  Relationships  . Social connections:    Talks on phone: Not on file    Gets together: Not on file    Attends religious service: Not on file    Active member of club or organization: Not on file    Attends meetings of clubs or organizations: Not on file    Relationship status: Not on file  . Intimate partner violence:  Fear of current or ex partner: Not on file    Emotionally abused: Not on file    Physically abused: Not on file    Forced sexual activity: Not on file  Other Topics Concern  . Not on file  Social History Narrative  . Not on file    Outpatient Medications Prior to Visit  Medication Sig Dispense Refill  . albuterol (PROVENTIL HFA;VENTOLIN HFA) 108 (90 Base) MCG/ACT inhaler Inhale 2 puffs into the lungs every 4 (four) hours as needed for wheezing or shortness of breath (cough, shortness of breath or wheezing.). 1 Inhaler 1  . budesonide-formoterol (SYMBICORT) 160-4.5 MCG/ACT inhaler Inhale 2 puffs into the lungs 2 (two) times daily. 1 Inhaler 5  . chlorpheniramine (CHLOR-TRIMETON) 4 MG tablet Take 8 mg by mouth 3 (three) times daily.     Marland Kitchen DEXAMETHASONE PO Take  0.5 tablets by mouth daily as needed (for back and hip pain).     . pravastatin (PRAVACHOL) 80 MG tablet Take 1 tablet (80 mg total) by mouth daily. 90 tablet 0  . traZODone (DESYREL) 50 MG tablet Take 0.5-3 tablets (25-150 mg total) by mouth at bedtime as needed for sleep. 60 tablet 1  . etodolac (LODINE) 400 MG tablet TAKE 1 TABLET(400 MG) BY MOUTH DAILY 90 tablet 0  . metoprolol succinate (TOPROL-XL) 50 MG 24 hr tablet Take 1 tablet (50 mg total) by mouth daily. Take with or immediately following a meal. 90 tablet 1  . olmesartan (BENICAR) 20 MG tablet TAKE 1 TABLET(20 MG) BY MOUTH DAILY 90 tablet 0  . omeprazole (PRILOSEC) 20 MG capsule TAKE 1 CAPSULE(20 MG) BY MOUTH DAILY 90 capsule 1  . budesonide-formoterol (SYMBICORT) 160-4.5 MCG/ACT inhaler Inhale 2 puffs into the lungs 2 (two) times daily for 1 day. 1 Inhaler 0   No facility-administered medications prior to visit.     Allergies  Allergen Reactions  . Lisinopril Cough    COUGH    ROS Review of Systems Review of Systems  Constitutional: Negative for activity change, appetite change, chills and fever.  HENT: Negative for congestion, nosebleeds, trouble swallowing and voice change.   Respiratory: Negative for cough, shortness of breath and wheezing.   Gastrointestinal: Negative for diarrhea, nausea and vomiting.  Genitourinary: Negative for difficulty urinating, dysuria, flank pain and hematuria.  Musculoskeletal: see hpi Neurological: Negative for dizziness, speech difficulty, light-headedness and numbness.  See HPI. All other review of systems negative.     Objective:    Physical Exam  BP 115/74   Pulse 86   Temp 98.2 F (36.8 C) (Oral)   Resp 20   Ht 5' 9.17" (1.757 m)   Wt 207 lb 12.8 oz (94.3 kg)   SpO2 96%   BMI 30.53 kg/m  Wt Readings from Last 3 Encounters:  05/16/18 207 lb 12.8 oz (94.3 kg)  02/08/18 214 lb (97.1 kg)  12/31/17 198 lb (89.8 kg)   Physical Exam  Constitutional: Oriented to person,  place, and time. Appears well-developed and well-nourished. Ambulates with a cane HENT:  Head: Normocephalic and atraumatic.  Eyes: Conjunctivae and EOM are normal.  Cardiovascular: Normal rate, regular rhythm, normal heart sounds and intact distal pulses.  No murmur heard. Pulmonary/Chest: Effort normal and breath sounds normal. No stridor. No respiratory distress. Has no wheezes.  Neurological: Is alert and oriented to person, place, and time.  Skin: Skin is warm. Capillary refill takes less than 2 seconds.  Psychiatric: Has a normal mood and affect. Behavior is normal. Judgment  and thought content normal.   There are no preventive care reminders to display for this patient.  There are no preventive care reminders to display for this patient.  No results found for: TSH Lab Results  Component Value Date   WBC 7.4 11/13/2017   HGB 13.3 11/13/2017   HCT 40.3 11/13/2017   MCV 85.7 11/13/2017   PLT 360.0 11/13/2017   Lab Results  Component Value Date   NA 136 10/09/2016   K 4.4 10/09/2016   CO2 22 10/09/2016   GLUCOSE 94 10/09/2016   BUN 12 10/09/2016   CREATININE 0.81 10/09/2016   BILITOT 0.3 10/09/2016   ALKPHOS 55 10/09/2016   AST 32 10/09/2016   ALT 19 10/09/2016   PROT 6.6 10/09/2016   ALBUMIN 4.4 10/09/2016   CALCIUM 8.9 10/09/2016   Lab Results  Component Value Date   CHOL 220 (H) 05/04/2017   Lab Results  Component Value Date   HDL 57 05/04/2017   Lab Results  Component Value Date   LDLCALC 136 (H) 05/04/2017   Lab Results  Component Value Date   TRIG 133 05/04/2017   Lab Results  Component Value Date   CHOLHDL 3.9 05/04/2017   No results found for: HGBA1C    Assessment & Plan:   Problem List Items Addressed This Visit      Cardiovascular and Mediastinum   Hypertension   Relevant Medications   olmesartan (BENICAR) 20 MG tablet   metoprolol succinate (TOPROL-XL) 50 MG 24 hr tablet   Other Relevant Orders   CMP14+EGFR   Lipid panel      Other   Mixed hyperlipidemia - Primary    Discussed medications that affect lipids Reminded patient to avoid grapefruits Discussed current meds: statin, aspirin Advised dietary fiber and fish oil and ways to keep HDL high CAD prevention and reviewed side effects of statins    Relevant Medications   olmesartan (BENICAR) 20 MG tablet   metoprolol succinate (TOPROL-XL) 50 MG 24 hr tablet   Other Relevant Orders   CMP14+EGFR   Lipid panel   TSH   Iron deficiency anemia  -  Start slow Fe over the counter   Relevant Orders   CBC with Differential/Platelet    Other Visit Diagnoses    Moderate asthma with acute exacerbation, unspecified whether persistent       Arthritis    -  Discussed lodine   Relevant Medications   etodolac (LODINE) 400 MG tablet   Gastroesophageal reflux disease without esophagitis    -  Continue reflux medication    Relevant Medications   omeprazole (PRILOSEC) 20 MG capsule    Hypertension - Patient's blood pressure is at goal of 139/89 or less. Condition is stable. Continue current medications and treatment plan. I recommend that you exercise for 30-45 minutes 5 days a week. I also recommend a balanced diet with fruits and vegetables every day, lean meats, and little fried foods. The DASH diet (you can find this online) is a good example of this.   Meds ordered this encounter  Medications  . olmesartan (BENICAR) 20 MG tablet    Sig: TAKE 1 TABLET(20 MG) BY MOUTH DAILY    Dispense:  90 tablet    Refill:  1    **Patient requests 90 days supply**  . metoprolol succinate (TOPROL-XL) 50 MG 24 hr tablet    Sig: Take 1 tablet (50 mg total) by mouth daily. Take with or immediately following a meal.    Dispense:  90 tablet    Refill:  1  . omeprazole (PRILOSEC) 20 MG capsule    Sig: TAKE 1 CAPSULE(20 MG) BY MOUTH DAILY    Dispense:  90 capsule    Refill:  1    **Patient requests 90 days supply**  . etodolac (LODINE) 400 MG tablet    Sig: TAKE 1 TABLET(400 MG)  BY MOUTH DAILY    Dispense:  90 tablet    Refill:  0    Follow-up: Return in about 6 months (around 11/14/2018) for hypertension.    Forrest Moron, MD

## 2018-05-16 NOTE — Patient Instructions (Signed)
Anemia  Anemia is a condition in which you do not have enough red blood cells or hemoglobin. Hemoglobin is a substance in red blood cells that carries oxygen. When you do not have enough red blood cells or hemoglobin (are anemic), your body cannot get enough oxygen and your organs may not work properly. As a result, you may feel very tired or have other problems. What are the causes? Common causes of anemia include:  Excessive bleeding. Anemia can be caused by excessive bleeding inside or outside the body, including bleeding from the intestine or from periods in women.  Poor nutrition.  Long-lasting (chronic) kidney, thyroid, and liver disease.  Bone marrow disorders.  Cancer and treatments for cancer.  HIV (human immunodeficiency virus) and AIDS (acquired immunodeficiency syndrome).  Treatments for HIV and AIDS.  Spleen problems.  Blood disorders.  Infections, medicines, and autoimmune disorders that destroy red blood cells. What are the signs or symptoms? Symptoms of this condition include:  Minor weakness.  Dizziness.  Headache.  Feeling heartbeats that are irregular or faster than normal (palpitations).  Shortness of breath, especially with exercise.  Paleness.  Cold sensitivity.  Indigestion.  Nausea.  Difficulty sleeping.  Difficulty concentrating. Symptoms may occur suddenly or develop slowly. If your anemia is mild, you may not have symptoms. How is this diagnosed? This condition is diagnosed based on:  Blood tests.  Your medical history.  A physical exam.  Bone marrow biopsy. Your health care provider may also check your stool (feces) for blood and may do additional testing to look for the cause of your bleeding. You may also have other tests, including:  Imaging tests, such as a CT scan or MRI.  Endoscopy.  Colonoscopy. How is this treated? Treatment for this condition depends on the cause. If you continue to lose a lot of blood, you may  need to be treated at a hospital. Treatment may include:  Taking supplements of iron, vitamin S31, or folic acid.  Taking a hormone medicine (erythropoietin) that can help to stimulate red blood cell growth.  Having a blood transfusion. This may be needed if you lose a lot of blood.  Making changes to your diet.  Having surgery to remove your spleen. Follow these instructions at home:  Take over-the-counter and prescription medicines only as told by your health care provider.  Take supplements only as told by your health care provider.  Follow any diet instructions that you were given.  Keep all follow-up visits as told by your health care provider. This is important. Contact a health care provider if:  You develop new bleeding anywhere in the body. Get help right away if:  You are very weak.  You are short of breath.  You have pain in your abdomen or chest.  You are dizzy or feel faint.  You have trouble concentrating.  You have bloody or black, tarry stools.  You vomit repeatedly or you vomit up blood. Summary  Anemia is a condition in which you do not have enough red blood cells or enough of a substance in your red blood cells that carries oxygen (hemoglobin).  Symptoms may occur suddenly or develop slowly.  If your anemia is mild, you may not have symptoms.  This condition is diagnosed with blood tests as well as a medical history and physical exam. Other tests may be needed.  Treatment for this condition depends on the cause of the anemia. This information is not intended to replace advice given to you by  your health care provider. Make sure you discuss any questions you have with your health care provider. Document Released: 05/04/2004 Document Revised: 04/28/2016 Document Reviewed: 04/28/2016 Elsevier Interactive Patient Education  2019 [employer].

## 2018-05-17 LAB — TSH: TSH: 2.04 u[IU]/mL (ref 0.450–4.500)

## 2018-05-17 LAB — CMP14+EGFR
ALBUMIN: 4.5 g/dL (ref 3.8–4.9)
ALT: 18 IU/L (ref 0–44)
AST: 29 IU/L (ref 0–40)
Albumin/Globulin Ratio: 1.7 (ref 1.2–2.2)
Alkaline Phosphatase: 62 IU/L (ref 39–117)
BUN / CREAT RATIO: 16 (ref 9–20)
BUN: 14 mg/dL (ref 6–24)
Bilirubin Total: 0.2 mg/dL (ref 0.0–1.2)
CALCIUM: 9.3 mg/dL (ref 8.7–10.2)
CO2: 20 mmol/L (ref 20–29)
CREATININE: 0.85 mg/dL (ref 0.76–1.27)
Chloride: 100 mmol/L (ref 96–106)
GFR calc Af Amer: 111 mL/min/{1.73_m2} (ref >59)
GFR, EST NON AFRICAN AMERICAN: 96 mL/min/{1.73_m2} (ref >59)
GLOBULIN, TOTAL: 2.7 g/dL (ref 1.5–4.5)
GLUCOSE: 94 mg/dL (ref 65–99)
Potassium: 4.7 mmol/L (ref 3.5–5.2)
SODIUM: 138 mmol/L (ref 134–144)
Total Protein: 7.2 g/dL (ref 6.0–8.5)

## 2018-05-17 LAB — CBC WITH DIFFERENTIAL/PLATELET
BASOS: 1 %
Basophils Absolute: 0.1 10*3/uL (ref 0.0–0.2)
EOS (ABSOLUTE): 0.2 10*3/uL (ref 0.0–0.4)
Eos: 3 %
Hematocrit: 38 % (ref 37.5–51.0)
Hemoglobin: 12.3 g/dL — ABNORMAL LOW (ref 13.0–17.7)
Immature Grans (Abs): 0 10*3/uL (ref 0.0–0.1)
Immature Granulocytes: 0 %
Lymphocytes Absolute: 1.9 10*3/uL (ref 0.7–3.1)
Lymphs: 27 %
MCH: 27.8 pg (ref 26.6–33.0)
MCHC: 32.4 g/dL (ref 31.5–35.7)
MCV: 86 fL (ref 79–97)
Monocytes Absolute: 0.7 10*3/uL (ref 0.1–0.9)
Monocytes: 10 %
NEUTROS ABS: 4.2 10*3/uL (ref 1.4–7.0)
Neutrophils: 59 %
Platelets: 372 10*3/uL (ref 150–450)
RBC: 4.42 x10E6/uL (ref 4.14–5.80)
RDW: 13 % (ref 11.6–15.4)
WBC: 7.1 10*3/uL (ref 3.4–10.8)

## 2018-05-17 LAB — LIPID PANEL
Chol/HDL Ratio: 4.3 ratio (ref 0.0–5.0)
Cholesterol, Total: 243 mg/dL — ABNORMAL HIGH (ref 100–199)
HDL: 56 mg/dL (ref >39)
LDL Calculated: 146 mg/dL — ABNORMAL HIGH (ref 0–99)
Triglycerides: 203 mg/dL — ABNORMAL HIGH (ref 0–149)
VLDL Cholesterol Cal: 41 mg/dL — ABNORMAL HIGH (ref 5–40)

## 2018-05-21 ENCOUNTER — Ambulatory Visit: Payer: PPO | Admitting: Pulmonary Disease

## 2018-05-21 ENCOUNTER — Encounter: Payer: Self-pay | Admitting: Pulmonary Disease

## 2018-05-21 VITALS — BP 148/70 | HR 103 | Ht 70.0 in | Wt 216.0 lb

## 2018-05-21 DIAGNOSIS — J45901 Unspecified asthma with (acute) exacerbation: Secondary | ICD-10-CM | POA: Diagnosis not present

## 2018-05-21 DIAGNOSIS — R062 Wheezing: Secondary | ICD-10-CM | POA: Diagnosis not present

## 2018-05-21 DIAGNOSIS — R0602 Shortness of breath: Secondary | ICD-10-CM | POA: Diagnosis not present

## 2018-05-21 DIAGNOSIS — R05 Cough: Secondary | ICD-10-CM | POA: Diagnosis not present

## 2018-05-21 DIAGNOSIS — R059 Cough, unspecified: Secondary | ICD-10-CM

## 2018-05-21 MED ORDER — ALBUTEROL SULFATE HFA 108 (90 BASE) MCG/ACT IN AERS: 2.0000 | INHALATION_SPRAY | RESPIRATORY_TRACT | 1 refills | Status: DC | PRN

## 2018-05-21 NOTE — Progress Notes (Signed)
Jesse Bell    [id number]    1960-10-03  Primary Care Physician:Stallings, Arlie Solomons, MD  Referring Physician: No referring provider defined for this encounter.  Chief complaint: Follow up for cough  HPI: 58 year old with history of reactive airway disease, asthma, hypertension.  Evaluated in the pulmonary clinic for dyspnea. Complains of cough for the past couple of years, nonproductive in nature.  No dyspnea, wheezing, mucus production, fevers, chills. He was seen in urgent care and treated with prednisone with improvement in symptoms.  He was told that he may have asthma. Has albuterol inhaler which he uses several times a week.  No nocturnal awakenings. He was on lisinopril which was changed to Benicar.  States that cough is unchanged.  History noted for pulmonary fibrosis with father and brother.  His brother has IPF and is being followed by Dr. Chase Caller in the [facility] clinic  Pets: Has a cat, no birds, farm animals Occupation: He works at an [employer].  Currently disabled after femur fracture Exposures: No known exposures, no mold, hot tub, Jacuzzi Smoking history: Never smoked Travel history: Grew up in [place], No significant travel. Relevant family history: Father had unspecified fibrosis.  Brother has IPF.  Interim history: Cough is unchanged Breathing is stable with no dyspnea on exertion, wheezing He was given Symbicort at last visit but cannot tell if it is helping with the cough.  Outpatient Encounter Medications as of 05/21/2018  Medication Sig  . albuterol (PROVENTIL HFA;VENTOLIN HFA) 108 (90 Base) MCG/ACT inhaler Inhale 2 puffs into the lungs every 4 (four) hours as needed for wheezing or shortness of breath (cough, shortness of breath or wheezing.).  Marland Kitchen budesonide-formoterol (SYMBICORT) 160-4.5 MCG/ACT inhaler Inhale 2 puffs into the lungs 2 (two) times daily.  . chlorpheniramine (CHLOR-TRIMETON) 4 MG tablet Take 8 mg by mouth 3 (three) times daily.   Marland Kitchen  etodolac (LODINE) 400 MG tablet TAKE 1 TABLET(400 MG) BY MOUTH DAILY  . metoprolol succinate (TOPROL-XL) 50 MG 24 hr tablet Take 1 tablet (50 mg total) by mouth daily. Take with or immediately following a meal.  . olmesartan (BENICAR) 20 MG tablet TAKE 1 TABLET(20 MG) BY MOUTH DAILY  . omeprazole (PRILOSEC) 20 MG capsule TAKE 1 CAPSULE(20 MG) BY MOUTH DAILY  . pravastatin (PRAVACHOL) 80 MG tablet Take 1 tablet (80 mg total) by mouth daily.  . budesonide-formoterol (SYMBICORT) 160-4.5 MCG/ACT inhaler Inhale 2 puffs into the lungs 2 (two) times daily for 1 day.  Marland Kitchen DEXAMETHASONE PO Take 0.5 tablets by mouth daily as needed (for back and hip pain).   . traZODone (DESYREL) 50 MG tablet Take 0.5-3 tablets (25-150 mg total) by mouth at bedtime as needed for sleep. (Patient not taking: Reported on 05/21/2018)   No facility-administered encounter medications on file as of 05/21/2018.    Physical Exam: Blood pressure (!) 148/70, pulse (!) 103, height 5\' 10"  (1.778 m), weight 216 lb (98 kg), SpO2 99 %. Gen:      No acute distress HEENT:  EOMI, sclera anicteric Neck:     No masses; no thyromegaly Lungs:    Clear to auscultation bilaterally; normal respiratory effort CV:         Regular rate and rhythm; no murmurs Abd:      + bowel sounds; soft, non-tender; no palpable masses, no distension Ext:    No edema; adequate peripheral perfusion Skin:      Warm and dry; no rash Neuro: alert and oriented  x 3 Psych: normal mood and affect  Data Reviewed Imaging Chest x-ray 10/06/2017- mild peribronchial thickening, minimal scoliosis, old healed rib fractures. CT 11/22/2017-no interstitial lung disease, bronchial wall thickening, 5 mm right lower lobe nodule. I reviewed the images  PFTs  12/31/2017 FVC 4.76 [101%), FEV1 2.47 [97%), F/F 73, TLC 98%, DLCO 97% Minimal obstructive airway disease.  FENO 11/13/2017-16  Labs CBC 05/16/2018-WBC 7.1, eos 3 percent, absolute eosinophil count 213  Assessment:    Follow-up for cough Likely secondary to ACE inhibitor, postnasal drip, GERD PFTs show very minimal obstruction, no evidence of interstitial lung disease though he has strong family history of pulmonary fibrosis.    Continue Symbicort for now.  He is using it only once a day.  Advised him to start using it 2 puffs twice daily  Rhinitis, postnasal drip Continue Chlor-Trimeton, Flonase nasal spray  GERD Continiue Prilosec  Health maintenance 12/09/2017-influenza  Plan/Recommendations: - Chlor-Trimeton, Flonase - Prilosec - Continue Symbicort.  Marshell Garfinkel MD [facility] 05/21/2018, 1:39 PM  CC: No ref. provider found

## 2018-05-21 NOTE — Addendum Note (Signed)
Addended by: Karmen Stabs on: 05/21/2018 01:52 PM   Modules accepted: Orders

## 2018-05-21 NOTE — Patient Instructions (Signed)
Continue using the Symbicort.  Use it 2 puffs twice daily for maximum effect Continue on the chlorpheniramine and Flonase nasal spray Follow-up in 6 months.

## 2018-06-20 ENCOUNTER — Other Ambulatory Visit: Payer: Self-pay | Admitting: Family Medicine

## 2018-06-20 DIAGNOSIS — E785 Hyperlipidemia, unspecified: Secondary | ICD-10-CM

## 2018-06-20 NOTE — Telephone Encounter (Signed)
Requested Prescriptions  Pending Prescriptions Disp Refills  . pravastatin (PRAVACHOL) 80 MG tablet [Pharmacy Med Name: PRAVASTATIN 80MG  TABLETS] 90 tablet 0    Sig: TAKE 1 TABLET(80 MG) BY MOUTH DAILY     Cardiovascular:  Antilipid - Statins Failed - 06/20/2018  6:01 AM      Failed - Total Cholesterol in normal range and within 360 days    Cholesterol, Total  Date Value Ref Range Status  05/16/2018 243 (H) 100 - 199 mg/dL Final         Failed - LDL in normal range and within 360 days    LDL Calculated  Date Value Ref Range Status  05/16/2018 146 (H) 0 - 99 mg/dL Final         Failed - Triglycerides in normal range and within 360 days    Triglycerides  Date Value Ref Range Status  05/16/2018 203 (H) 0 - 149 mg/dL Final         Passed - HDL in normal range and within 360 days    HDL  Date Value Ref Range Status  05/16/2018 56 >39 mg/dL Final         Passed - Patient is not pregnant      Passed - Valid encounter within last 12 months    Recent Outpatient Visits          1 month ago Mixed hyperlipidemia   [facility], Arlie Solomons, MD   6 months ago Essential hypertension   [facility], Damaris Hippo, PA-C   7 months ago Essential hypertension   [facility], Damaris Hippo, PA-C   8 months ago Cough   Primary [facility] at [facility], Fenton Malling, MD   1 year ago Essential hypertension   [facility], Damaris Hippo, PA-C      Future Appointments            In 4 months Forrest Moron, MD Primary [facility] at [place]

## 2018-08-22 ENCOUNTER — Ambulatory Visit: Payer: Self-pay

## 2018-08-22 NOTE — Telephone Encounter (Signed)
Incoming call from Patient who complains  Of Guitar falling on his ankle last week causing a bruise.  Patient states that the last time this occurred he developed an blood clot.  Patient getting concerned .  It is still swelling.  Pain rated 5 to 6 Patient denies any other symptoms.  Provied care advice and reviewed protocol with Patient.  Transferred Patient to  [place] to schedule  Virtual appiontment.                       Reason for Disposition . [1] Very swollen joint AND [2] no fever  Answer Assessment - Initial Assessment Questions 1. LOCATION: "Which joint is swollen?"    Right ankle 2. ONSET: "When did the swelling start?"     A week ago 3. SIZE: "How large is the swelling?"    Still swelling 4. PAIN: "Is there any pain?" If so, ask: "How bad is it?" (Scale 1-10; or mild, moderate, severe)     5 or 6 5. CAUSE: "What do you think caused the swollen joint?"     guitatra fell on ankle 6. OTHER SYMPTOMS: "Do you have any other symptoms?" (e.g., fever, chest pain, difficulty breathing, calf pain)     Denies 7. PREGNANCY: "Is there any chance you are pregnant?" "When was your last menstrual period?"     na  Protocols used: [facility]

## 2018-08-23 NOTE — Telephone Encounter (Signed)
FYI

## 2018-08-26 ENCOUNTER — Other Ambulatory Visit: Payer: Self-pay

## 2018-08-26 ENCOUNTER — Encounter: Payer: Self-pay | Admitting: Registered Nurse

## 2018-08-26 ENCOUNTER — Ambulatory Visit (INDEPENDENT_AMBULATORY_CARE_PROVIDER_SITE_OTHER): Payer: PPO | Admitting: Registered Nurse

## 2018-08-26 VITALS — BP 102/70 | HR 78 | Temp 97.6°F | Resp 20 | Ht 70.0 in | Wt 216.0 lb

## 2018-08-26 DIAGNOSIS — S8011XA Contusion of right lower leg, initial encounter: Secondary | ICD-10-CM

## 2018-08-26 DIAGNOSIS — M7989 Other specified soft tissue disorders: Secondary | ICD-10-CM | POA: Diagnosis not present

## 2018-08-26 DIAGNOSIS — Z86718 Personal history of other venous thrombosis and embolism: Secondary | ICD-10-CM | POA: Diagnosis not present

## 2018-08-26 NOTE — Progress Notes (Signed)
Acute Office Visit  Subjective:    Patient ID: Jesse Bell, male    DOB: [birth year], 59 y.o.   MRN: [id number]  Chief Complaint  Patient presents with  . Ankle Injury    pt states he had an object fall on his ankle x 2 weeks ago. ( right) There is some swelling and color change to the area.     Pt reports guitar falling off stand and landing on leg 1.5-2 weeks prior to visit States bruising has worsened, swelling has remained the same. Weight bearing ok, no change in ROM of knee or ankle Has not been red, warm to touch. Bruising has moved from area of injury down towards medial malleolus and Achilles area.   Patient is in today for swelling and tenderness on LRE  Past Medical History:  Diagnosis Date  . Back pain   . Hypertension     Past Surgical History:  Procedure Laterality Date  . FRACTURE SURGERY    . JOINT REPLACEMENT      Family History  Problem Relation Age of Onset  . Pulmonary fibrosis Father   . Pulmonary fibrosis Brother     Social History   Socioeconomic History  . Marital status: Divorced    Spouse name: Not on file  . Number of children: 1  . Years of education: Not on file  . Highest education level: Not on file  Occupational History  . Not on file  Social Needs  . Financial resource strain: Not on file  . Food insecurity:    Worry: Not on file    Inability: Not on file  . Transportation needs:    Medical: Not on file    Non-medical: Not on file  Tobacco Use  . Smoking status: Never Smoker  . Smokeless tobacco: Never Used  Substance and Sexual Activity  . Alcohol use: Yes    Alcohol/week: 1.0 - 2.0 standard drinks    Types: 1 - 2 Cans of beer per week  . Drug use: No  . Sexual activity: Not Currently  Lifestyle  . Physical activity:    Days per week: Not on file    Minutes per session: Not on file  . Stress: Not on file  Relationships  . Social connections:    Talks on phone: Not on file    Gets together: Not on file   Attends religious service: Not on file    Active member of club or organization: Not on file    Attends meetings of clubs or organizations: Not on file    Relationship status: Not on file  . Intimate partner violence:    Fear of current or ex partner: Not on file    Emotionally abused: Not on file    Physically abused: Not on file    Forced sexual activity: Not on file  Other Topics Concern  . Not on file  Social History Narrative  . Not on file    Outpatient Medications Prior to Visit  Medication Sig Dispense Refill  . albuterol (PROVENTIL HFA;VENTOLIN HFA) 108 (90 Base) MCG/ACT inhaler Inhale 2 puffs into the lungs every 4 (four) hours as needed for wheezing or shortness of breath (cough, shortness of breath or wheezing.). 1 Inhaler 1  . budesonide-formoterol (SYMBICORT) 160-4.5 MCG/ACT inhaler Inhale 2 puffs into the lungs 2 (two) times daily. 1 Inhaler 5  . chlorpheniramine (CHLOR-TRIMETON) 4 MG tablet Take 8 mg by mouth 3 (three) times daily.     Marland Kitchen  DEXAMETHASONE PO Take 0.5 tablets by mouth daily as needed (for back and hip pain).     Marland Kitchen etodolac (LODINE) 400 MG tablet TAKE 1 TABLET(400 MG) BY MOUTH DAILY 90 tablet 0  . metoprolol succinate (TOPROL-XL) 50 MG 24 hr tablet Take 1 tablet (50 mg total) by mouth daily. Take with or immediately following a meal. 90 tablet 1  . olmesartan (BENICAR) 20 MG tablet TAKE 1 TABLET(20 MG) BY MOUTH DAILY 90 tablet 1  . pravastatin (PRAVACHOL) 80 MG tablet TAKE 1 TABLET(80 MG) BY MOUTH DAILY 90 tablet 0  . traZODone (DESYREL) 50 MG tablet Take 0.5-3 tablets (25-150 mg total) by mouth at bedtime as needed for sleep. 60 tablet 1  . omeprazole (PRILOSEC) 20 MG capsule TAKE 1 CAPSULE(20 MG) BY MOUTH DAILY 90 capsule 1  . budesonide-formoterol (SYMBICORT) 160-4.5 MCG/ACT inhaler Inhale 2 puffs into the lungs 2 (two) times daily for 1 day. 1 Inhaler 0   No facility-administered medications prior to visit.     Allergies  Allergen Reactions  .  Lisinopril Cough    COUGH    Review of Systems  Constitutional: Negative for fever and malaise/fatigue.  Respiratory: Negative for cough and shortness of breath.   Cardiovascular: Negative for chest pain and palpitations.  Musculoskeletal: Negative for falls.  Skin: Negative for itching and rash.       Objective:    Physical Exam  Skin: Skin is warm, dry and intact. He is not diaphoretic. No cyanosis. No pallor. Nails show no clubbing.       BP 102/70   Pulse 78   Temp 97.6 F (36.4 C) (Oral)   Resp 20   Ht 5\' 10"  (1.778 m)   Wt 216 lb (98 kg)   SpO2 98%   BMI 30.99 kg/m  Wt Readings from Last 3 Encounters:  08/26/18 216 lb (98 kg)  05/21/18 216 lb (98 kg)  05/16/18 207 lb 12.8 oz (94.3 kg)    There are no preventive care reminders to display for this patient.  There are no preventive care reminders to display for this patient.   Lab Results  Component Value Date   TSH 2.040 05/16/2018   Lab Results  Component Value Date   WBC 7.1 05/16/2018   HGB 12.3 (L) 05/16/2018   HCT 38.0 05/16/2018   MCV 86 05/16/2018   PLT 372 05/16/2018   Lab Results  Component Value Date   NA 138 05/16/2018   K 4.7 05/16/2018   CO2 20 05/16/2018   GLUCOSE 94 05/16/2018   BUN 14 05/16/2018   CREATININE 0.85 05/16/2018   BILITOT 0.2 05/16/2018   ALKPHOS 62 05/16/2018   AST 29 05/16/2018   ALT 18 05/16/2018   PROT 7.2 05/16/2018   ALBUMIN 4.5 05/16/2018   CALCIUM 9.3 05/16/2018   Lab Results  Component Value Date   CHOL 243 (H) 05/16/2018   Lab Results  Component Value Date   HDL 56 05/16/2018   Lab Results  Component Value Date   LDLCALC 146 (H) 05/16/2018   Lab Results  Component Value Date   TRIG 203 (H) 05/16/2018   Lab Results  Component Value Date   CHOLHDL 4.3 05/16/2018   No results found for: HGBA1C     Assessment & Plan:   Problem List Items Addressed This Visit    None    Visit Diagnoses    Leg swelling    -  Primary   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  PLAN:  Pt referred to imaging for U/S of LRE  Pt referred to Gen Surg following U/S for consult and assessment of hematoma for evacuation  Pt to call clinic with any questions, comments, concerns  Follow up with PCP Dr. Nolon Rod following evacuation.  Maximiano Coss, NP

## 2018-08-26 NOTE — Patient Instructions (Signed)
° ° ° °  If you have lab work done today you will be contacted with your lab results within the next 2 weeks.  If you have not heard from us then please contact us. The fastest way to get your results is to register for My Chart. ° ° °IF you received an x-ray today, you will receive an invoice from [facility]. Please contact [facility] at [phone] with questions or concerns regarding your invoice.  ° °IF you received labwork today, you will receive an invoice from [facility]. Please contact [facility] at [phone] with questions or concerns regarding your invoice.  ° °Our billing staff will not be able to assist you with questions regarding bills from these companies. ° °You will be contacted with the lab results as soon as they are available. The fastest way to get your results is to activate your My Chart account. Instructions are located on the last page of this paperwork. If you have not heard from us regarding the results in 2 weeks, please contact this office. °  ° ° ° °

## 2018-08-27 ENCOUNTER — Ambulatory Visit (HOSPITAL_COMMUNITY)
Admission: RE | Admit: 2018-08-27 | Discharge: 2018-08-27 | Disposition: A | Discharge status: Home / Self Care | Payer: PPO | Source: Ambulatory Visit | Attending: Registered Nurse | Admitting: Registered Nurse

## 2018-08-27 DIAGNOSIS — S8011XA Contusion of right lower leg, initial encounter: Secondary | ICD-10-CM | POA: Diagnosis not present

## 2018-08-27 DIAGNOSIS — Z86718 Personal history of other venous thrombosis and embolism: Secondary | ICD-10-CM | POA: Diagnosis not present

## 2018-08-27 LAB — D-DIMER, QUANTITATIVE: D-DIMER: 1.7 mg/L FEU — ABNORMAL HIGH (ref 0.00–0.49)

## 2018-08-27 NOTE — Progress Notes (Signed)
Lower extremity venous has been completed.   Preliminary results in CV Proc.   [name] 08/27/2018 10:13 AM

## 2018-09-10 ENCOUNTER — Telehealth: Payer: Self-pay | Admitting: Registered Nurse

## 2018-09-10 NOTE — Telephone Encounter (Signed)
Per Judson Roch at [facility] patient needs to be seen by ortho

## 2018-09-18 ENCOUNTER — Other Ambulatory Visit: Payer: Self-pay | Admitting: Family Medicine

## 2018-09-18 DIAGNOSIS — E785 Hyperlipidemia, unspecified: Secondary | ICD-10-CM

## 2018-11-06 ENCOUNTER — Telehealth: Payer: Self-pay | Admitting: *Deleted

## 2018-11-06 NOTE — Telephone Encounter (Signed)
Schedule AWV.  

## 2018-11-13 ENCOUNTER — Ambulatory Visit (INDEPENDENT_AMBULATORY_CARE_PROVIDER_SITE_OTHER): Payer: PPO | Admitting: Family Medicine

## 2018-11-13 ENCOUNTER — Other Ambulatory Visit: Payer: Self-pay

## 2018-11-13 ENCOUNTER — Encounter: Payer: Self-pay | Admitting: Family Medicine

## 2018-11-13 VITALS — BP 110/78 | HR 78 | Temp 98.0°F | Resp 17 | Ht 70.0 in | Wt 197.0 lb

## 2018-11-13 DIAGNOSIS — M1990 Unspecified osteoarthritis, unspecified site: Secondary | ICD-10-CM

## 2018-11-13 DIAGNOSIS — J45901 Unspecified asthma with (acute) exacerbation: Secondary | ICD-10-CM | POA: Diagnosis not present

## 2018-11-13 DIAGNOSIS — E785 Hyperlipidemia, unspecified: Secondary | ICD-10-CM | POA: Diagnosis not present

## 2018-11-13 DIAGNOSIS — I10 Essential (primary) hypertension: Secondary | ICD-10-CM | POA: Diagnosis not present

## 2018-11-13 DIAGNOSIS — J310 Chronic rhinitis: Secondary | ICD-10-CM

## 2018-11-13 DIAGNOSIS — G479 Sleep disorder, unspecified: Secondary | ICD-10-CM | POA: Diagnosis not present

## 2018-11-13 DIAGNOSIS — E782 Mixed hyperlipidemia: Secondary | ICD-10-CM

## 2018-11-13 MED ORDER — MONTELUKAST SODIUM 10 MG PO TABS: 10.0000 mg | ORAL_TABLET | Freq: Every day | ORAL | 3 refills | Status: DC

## 2018-11-13 MED ORDER — OLMESARTAN MEDOXOMIL 20 MG PO TABS: ORAL_TABLET | ORAL | 1 refills | Status: DC

## 2018-11-13 MED ORDER — PRAVASTATIN SODIUM 80 MG PO TABS: ORAL_TABLET | ORAL | 1 refills | Status: DC

## 2018-11-13 MED ORDER — TRAZODONE HCL 50 MG PO TABS: 25.0000 mg | ORAL_TABLET | Freq: Every evening | ORAL | 1 refills | Status: DC | PRN

## 2018-11-13 MED ORDER — ETODOLAC 400 MG PO TABS: ORAL_TABLET | ORAL | 1 refills | Status: DC

## 2018-11-13 MED ORDER — METOPROLOL SUCCINATE ER 50 MG PO TB24: 50.0000 mg | ORAL_TABLET | Freq: Every day | ORAL | 1 refills | Status: DC

## 2018-11-13 NOTE — Patient Instructions (Addendum)
If you have lab work done today you will be contacted with your lab results within the next 2 weeks.  If you have not heard from [place] then please contact us. The fastest way to get your results is to register for My Chart.   IF you received an x-ray today, you will receive an invoice from [facility]. Please contact [facility] at [phone] with questions or concerns regarding your invoice.   IF you received labwork today, you will receive an invoice from [facility]. Please contact [facility] at [phone] with questions or concerns regarding your invoice.   Our billing staff will not be able to assist you with questions regarding bills from these companies.  You will be contacted with the lab results as soon as they are available. The fastest way to get your results is to activate your My Chart account. Instructions are located on the last page of this paperwork. If you have not heard from [place] regarding the results in 2 weeks, please contact this office.     Allergy Blood Testing Why am I having this test? Allergy blood testing is used to help diagnose specific allergies. An allergy occurs when your body's defense system (immune system) is more sensitive to certain substances. The immune system overreacts to the substance, causing allergy symptoms. You may have this test to help find out what is causing symptoms such as:  Rashes.  Runny nose.  Sneezing.  Asthma flare-ups. Blood testing is often used when skin testing for allergies is not an option. What is being tested? This test measures the level of immunoglobulin E (IgE) in your blood. IgE is an antibody that the body produces in response to a substance that you are allergic to (allergen). Testing can determine exactly what allergen affects you. A common method used to measure IgE is the radioallergosorbent test (RAST) in which specific allergens are tested. You can be tested for various allergens,  including:  Animal dander.  Foods.  Pollens.  Dusts.  Latex.  Molds.  Insect venoms.  Medicines. What kind of sample is taken?  A blood sample is required for this test. It is usually collected by inserting a needle into a blood vessel. Tell a health care provider about:  All medicines you are taking, including vitamins, herbs, eye drops, creams, and over-the-counter medicines.  Any medical conditions you have. How are the results reported? Your test results will be reported as values or RAST ratings. Your health care provider will compare your results to normal ranges that were established after testing a large group of people (reference ranges). Reference ranges may vary among labs and hospitals. For this test, common reference ranges are: Reference ranges for IgE  Adult: 0-100 IU/mL.  Child 0-23 months: 0-13 IU/mL.  Child 2-5 years: 0-56 IU/mL.  Child 6-10 years: 0-85 IU/mL. Reference ranges for RAST  RAST rating 0: IgE level less than 0.35 kU/L.  RAST rating 1: IgE level 0.35-0.69 kU/L.  RAST rating 2: IgE level 0.70-3.49 kU/L.  RAST rating 3: IgE level 3.50-17.49 kU/L.  RAST rating 4: IgE level 17.50-49.99 kU/L.  RAST rating 5: IgE level 50-100 kU/L.  RAST rating 6: IgE level greater than 100 kU/L. What do the results mean? IgE levels increase when an allergic person is exposed to the allergen. The higher the IgE level in a RAST test, the higher the likelihood of an allergy to that allergen. A RAST rating of:  0 means that you have an absent or  undetectable allergen-specific IgE.  1 means that you have a low level of allergen-specific IgE.  2 means that you have a moderate level of allergen-specific IgE.  3 means that you have a high level of allergen-specific IgE.  4 means that you have a very high level of allergen-specific IgE.  5 means that you have a very high level of allergen-specific IgE.  6 means that you have an extremely high level of  allergen-specific IgE. Talk with your health care provider about what your results mean. Questions to ask your health care provider Ask your health care provider, or the department that is doing the test:  When will my results be ready?  How will I get my results?  What are my treatment options?  What other tests do I need?  What are my next steps? Summary  Allergy blood testing is used to help diagnose specific allergies.  This test measures the level of IgE in your blood. IgE is an antibody that the body produces in response to an allergen.  A common method used to measure IgE is the radioallergosorbent test (RAST) in which specific allergens are tested.  Allergens that may be tested include animal dander, foods, pollens, and latex. This information is not intended to replace advice given to you by your health care provider. Make sure you discuss any questions you have with your health care provider. Document Released: 04/18/2004 Document Revised: 07/16/2018 Document Reviewed: 01/16/2017 Elsevier Patient Education  2020 [employer].

## 2018-11-13 NOTE — Progress Notes (Signed)
Established Patient Office Visit  Subjective:  Patient ID: Jesse Bell, male    DOB: 10-21-1960  Age: 58 y.o. MRN: [id number]  CC:  Chief Complaint  Patient presents with  . Hypertension    6 month recheck    HPI Jesse Bell presents for   Hypertension: Patient here for follow-up of elevated blood pressure. He is not exercising and is adherent to low salt diet.  Blood pressure is well controlled at home. Cardiac symptoms lower extremity edema. Patient denies chest pain, claudication, exertional chest pressure/discomfort, fatigue, irregular heart beat, lower extremity edema, near-syncope, orthopnea and palpitations.  Cardiovascular risk factors: dyslipidemia, hypertension, male gender and sedentary lifestyle. Use of agents associated with hypertension: NSAIDS. History of target organ damage: none. BP Readings from Last 3 Encounters:  11/13/18 110/78  08/26/18 102/70  05/21/18 (!) 148/70    Allergic rhinitis Pt reports that he has postnasal drip that makes him cough, sneeze and causes runny nose He gets this year round He always feels like he has to clear his throat  Asthma He goes to pulmonology He was diagnosed with asthma and his asthma is intermittent He states that he is very cautious with his lungs with his father, uncle and his brother had idiopathic pulmonary fibrosis He has been evaluated and does not have IPF He states that he is using his symbicort daily He is using his albuterol daily to help open up his sinuses so he can breathing   Dyslipidemia: Patient presents for evaluation of lipids.  Compliance with treatment thus far has been good.  A repeat fasting lipid profile was done.  The patient does not use medications that may worsen dyslipidemias (corticosteroids, progestins, anabolic steroids, diuretics, beta-blockers, amiodarone, cyclosporine, olanzapine). The patient exercises rarely.  The patient is not known to have coexisting coronary artery disease.     The 10-year ASCVD risk score Mikey Bussing DC Brooke Bonito., et al., 2013) is: 7.3%   Values used to calculate the score:     Age: 70 years     Sex: Male     Is Non-Hispanic African American: No     Diabetic: No     Tobacco smoker: No     Systolic Blood Pressure: 931 mmHg     Is BP treated: Yes     HDL Cholesterol: 56 mg/dL     Total Cholesterol: 243 mg/dL  Past Medical History:  Diagnosis Date  . Back pain   . Hypertension     Past Surgical History:  Procedure Laterality Date  . FRACTURE SURGERY    . JOINT REPLACEMENT      Family History  Problem Relation Age of Onset  . Pulmonary fibrosis Father   . Pulmonary fibrosis Brother     Social History   Socioeconomic History  . Marital status: Divorced    Spouse name: Not on file  . Number of children: 1  . Years of education: Not on file  . Highest education level: Not on file  Occupational History  . Not on file  Social Needs  . Financial resource strain: Not on file  . Food insecurity    Worry: Not on file    Inability: Not on file  . Transportation needs    Medical: Not on file    Non-medical: Not on file  Tobacco Use  . Smoking status: Never Smoker  . Smokeless tobacco: Never Used  Substance and Sexual Activity  . Alcohol use: Yes    Alcohol/week: 1.0 - 2.0  standard drinks    Types: 1 - 2 Cans of beer per week  . Drug use: No  . Sexual activity: Not Currently  Lifestyle  . Physical activity    Days per week: Not on file    Minutes per session: Not on file  . Stress: Not on file  Relationships  . Social Herbalist on phone: Not on file    Gets together: Not on file    Attends religious service: Not on file    Active member of club or organization: Not on file    Attends meetings of clubs or organizations: Not on file    Relationship status: Not on file  . Intimate partner violence    Fear of current or ex partner: Not on file    Emotionally abused: Not on file    Physically abused: Not on file     Forced sexual activity: Not on file  Other Topics Concern  . Not on file  Social History Narrative  . Not on file    Outpatient Medications Prior to Visit  Medication Sig Dispense Refill  . albuterol (PROVENTIL HFA;VENTOLIN HFA) 108 (90 Base) MCG/ACT inhaler Inhale 2 puffs into the lungs every 4 (four) hours as needed for wheezing or shortness of breath (cough, shortness of breath or wheezing.). 1 Inhaler 1  . budesonide-formoterol (SYMBICORT) 160-4.5 MCG/ACT inhaler Inhale 2 puffs into the lungs 2 (two) times daily. 1 Inhaler 5  . chlorpheniramine (CHLOR-TRIMETON) 4 MG tablet Take 8 mg by mouth 3 (three) times daily.     Marland Kitchen DEXAMETHASONE PO Take 0.5 tablets by mouth daily as needed (for back and hip pain).     Marland Kitchen etodolac (LODINE) 400 MG tablet TAKE 1 TABLET(400 MG) BY MOUTH DAILY 90 tablet 0  . metoprolol succinate (TOPROL-XL) 50 MG 24 hr tablet Take 1 tablet (50 mg total) by mouth daily. Take with or immediately following a meal. 90 tablet 1  . olmesartan (BENICAR) 20 MG tablet TAKE 1 TABLET(20 MG) BY MOUTH DAILY 90 tablet 1  . pravastatin (PRAVACHOL) 80 MG tablet TAKE 1 TABLET(80 MG) BY MOUTH DAILY 90 tablet 0  . traZODone (DESYREL) 50 MG tablet Take 0.5-3 tablets (25-150 mg total) by mouth at bedtime as needed for sleep. 60 tablet 1  . budesonide-formoterol (SYMBICORT) 160-4.5 MCG/ACT inhaler Inhale 2 puffs into the lungs 2 (two) times daily for 1 day. 1 Inhaler 0   No facility-administered medications prior to visit.     Allergies  Allergen Reactions  . Lisinopril Cough    COUGH    ROS Review of Systems Review of Systems  Constitutional: Negative for activity change, appetite change, chills and fever.  HENT: Negative for congestion, nosebleeds, trouble swallowing and voice change.   Respiratory: Negative for cough, shortness of breath and wheezing.   Gastrointestinal: Negative for diarrhea, nausea and vomiting.  Genitourinary: Negative for difficulty urinating, dysuria, flank  pain and hematuria.  Neurological: Negative for dizziness, speech difficulty, light-headedness and numbness.  See HPI. All other review of systems negative.    Objective:    Physical Exam  BP 110/78 (BP Location: Right Arm, Patient Position: Sitting, Cuff Size: Large)   Pulse 78   Temp 98 F (36.7 C) (Oral)   Resp 17   Ht '5\' 10"'$  (1.778 m)   Wt 197 lb (89.4 kg)   SpO2 97%   BMI 28.27 kg/m  Wt Readings from Last 3 Encounters:  11/13/18 197 lb (89.4 kg)  08/26/18  216 lb (98 kg)  05/21/18 216 lb (98 kg)    Physical Exam  Constitutional: Oriented to person, place, and time. Appears well-developed and well-nourished. Ambulates with a cane. HENT:  Head: Normocephalic and atraumatic.  Eyes: Conjunctivae and EOM are normal.  Cardiovascular: Normal rate, regular rhythm, normal heart sounds and intact distal pulses. No LE edema No murmur heard. Pulmonary/Chest: Effort normal and breath sounds normal. No stridor. No respiratory distress. Has no wheezes.  Neurological: Is alert and oriented to person, place, and time.  Skin: Skin is warm. Capillary refill takes less than 2 seconds.  Psychiatric: Has a normal mood and affect. Behavior is normal. Judgment and thought content normal.    Health Maintenance Due  Topic Date Due  . INFLUENZA VACCINE  11/09/2018    There are no preventive care reminders to display for this patient.  Lab Results  Component Value Date   TSH 2.040 05/16/2018   Lab Results  Component Value Date   WBC 7.1 05/16/2018   HGB 12.3 (L) 05/16/2018   HCT 38.0 05/16/2018   MCV 86 05/16/2018   PLT 372 05/16/2018   Lab Results  Component Value Date   NA 138 05/16/2018   K 4.7 05/16/2018   CO2 20 05/16/2018   GLUCOSE 94 05/16/2018   BUN 14 05/16/2018   CREATININE 0.85 05/16/2018   BILITOT 0.2 05/16/2018   ALKPHOS 62 05/16/2018   AST 29 05/16/2018   ALT 18 05/16/2018   PROT 7.2 05/16/2018   ALBUMIN 4.5 05/16/2018   CALCIUM 9.3 05/16/2018   Lab  Results  Component Value Date   CHOL 243 (H) 05/16/2018   Lab Results  Component Value Date   HDL 56 05/16/2018   Lab Results  Component Value Date   LDLCALC 146 (H) 05/16/2018   Lab Results  Component Value Date   TRIG 203 (H) 05/16/2018   Lab Results  Component Value Date   CHOLHDL 4.3 05/16/2018   No results found for: HGBA1C    Assessment & Plan:   Problem List Items Addressed This Visit      Cardiovascular and Mediastinum   Hypertension - Patient's blood pressure is at goal of 139/89 or less. Condition is stable. Continue current medications and treatment plan. I recommend that you exercise for 30-45 minutes 5 days a week. I also recommend a balanced diet with fruits and vegetables every day, lean meats, and little fried foods. The DASH diet (you can find this online) is a good example of this.    Relevant Medications   olmesartan (BENICAR) 20 MG tablet   pravastatin (PRAVACHOL) 80 MG tablet   metoprolol succinate (TOPROL-XL) 50 MG 24 hr tablet   Other Relevant Orders   Lipid panel     Other   Mixed hyperlipidemia - Primary   Relevant Medications   olmesartan (BENICAR) 20 MG tablet   pravastatin (PRAVACHOL) 80 MG tablet   metoprolol succinate (TOPROL-XL) 50 MG 24 hr tablet   Other Relevant Orders   Lipid panel   CMP14+EGFR    Other Visit Diagnoses    Moderate asthma with acute exacerbation, unspecified whether persistent    - will start singulair Currently moderate persistent symptoms   Relevant Medications   montelukast (SINGULAIR) 10 MG tablet   Other Relevant Orders   Ambulatory referral to Allergy   Chronic rhinitis    - recommend singulair as well as continue current meds    Relevant Medications   montelukast (SINGULAIR) 10 MG tablet  Other Relevant Orders   Ambulatory referral to Allergy   Hyperlipidemia, unspecified hyperlipidemia type    - continue pravastatin, will assess    Relevant Medications   olmesartan (BENICAR) 20 MG tablet    pravastatin (PRAVACHOL) 80 MG tablet   metoprolol succinate (TOPROL-XL) 50 MG 24 hr tablet   Arthritis    - continue NSAID   Relevant Medications   etodolac (LODINE) 400 MG tablet   Sleep disturbance    - continue trazdodone prn   Relevant Medications   traZODone (DESYREL) 50 MG tablet      Meds ordered this encounter  Medications  . montelukast (SINGULAIR) 10 MG tablet    Sig: Take 1 tablet (10 mg total) by mouth at bedtime.    Dispense:  90 tablet    Refill:  3  . olmesartan (BENICAR) 20 MG tablet    Sig: TAKE 1 TABLET(20 MG) BY MOUTH DAILY    Dispense:  90 tablet    Refill:  1    **Patient requests 90 days supply**  . pravastatin (PRAVACHOL) 80 MG tablet    Sig: TAKE 1 TABLET(80 MG) BY MOUTH DAILY    Dispense:  90 tablet    Refill:  1  . metoprolol succinate (TOPROL-XL) 50 MG 24 hr tablet    Sig: Take 1 tablet (50 mg total) by mouth daily. Take with or immediately following a meal.    Dispense:  90 tablet    Refill:  1  . etodolac (LODINE) 400 MG tablet    Sig: TAKE 1 TABLET(400 MG) BY MOUTH DAILY    Dispense:  90 tablet    Refill:  1  . traZODone (DESYREL) 50 MG tablet    Sig: Take 0.5-3 tablets (25-150 mg total) by mouth at bedtime as needed for sleep.    Dispense:  60 tablet    Refill:  1    Follow-up: Return in about 6 months (around 05/16/2019) for HYPERTENSION .    Forrest Moron, MD

## 2018-11-14 LAB — CMP14+EGFR
ALT: 13 IU/L (ref 0–44)
AST: 21 IU/L (ref 0–40)
Albumin/Globulin Ratio: 2 (ref 1.2–2.2)
Albumin: 4.7 g/dL (ref 3.8–4.9)
Alkaline Phosphatase: 50 IU/L (ref 39–117)
BUN/Creatinine Ratio: 16 (ref 9–20)
BUN: 14 mg/dL (ref 6–24)
Bilirubin Total: 0.3 mg/dL (ref 0.0–1.2)
CO2: 22 mmol/L (ref 20–29)
Calcium: 9 mg/dL (ref 8.7–10.2)
Chloride: 100 mmol/L (ref 96–106)
Creatinine, Ser: 0.88 mg/dL (ref 0.76–1.27)
GFR calc Af Amer: 109 mL/min/{1.73_m2} (ref >59)
GFR calc non Af Amer: 95 mL/min/{1.73_m2} (ref >59)
Globulin, Total: 2.4 g/dL (ref 1.5–4.5)
Glucose: 96 mg/dL (ref 65–99)
Potassium: 4.9 mmol/L (ref 3.5–5.2)
Sodium: 138 mmol/L (ref 134–144)
Total Protein: 7.1 g/dL (ref 6.0–8.5)

## 2018-11-14 LAB — LIPID PANEL
Chol/HDL Ratio: 4.9 ratio (ref 0.0–5.0)
Cholesterol, Total: 229 mg/dL — ABNORMAL HIGH (ref 100–199)
HDL: 47 mg/dL (ref >39)
LDL Calculated: 145 mg/dL — ABNORMAL HIGH (ref 0–99)
Triglycerides: 187 mg/dL — ABNORMAL HIGH (ref 0–149)
VLDL Cholesterol Cal: 37 mg/dL (ref 5–40)

## 2018-12-03 ENCOUNTER — Inpatient Hospital Stay: Admission: RE | Admit: 2018-12-03 | Payer: PPO | Source: Ambulatory Visit

## 2018-12-04 ENCOUNTER — Ambulatory Visit: Payer: PPO | Admitting: Allergy

## 2018-12-19 ENCOUNTER — Ambulatory Visit (INDEPENDENT_AMBULATORY_CARE_PROVIDER_SITE_OTHER): Payer: PPO | Admitting: Family Medicine

## 2018-12-19 VITALS — BP 110/78 | Ht 69.0 in | Wt 197.0 lb

## 2018-12-19 DIAGNOSIS — Z0000 Encounter for general adult medical examination without abnormal findings: Secondary | ICD-10-CM

## 2018-12-19 NOTE — Patient Instructions (Signed)
Preventive Care 40-59 Years Old, Male Preventive care refers to lifestyle choices and visits with your health care provider that can promote health and wellness. This includes:  A yearly physical exam. This is also called an annual well check.  Regular dental and eye exams.  Immunizations.  Screening for certain conditions.  Healthy lifestyle choices, such as eating a healthy diet, getting regular exercise, not using drugs or products that contain nicotine and tobacco, and limiting alcohol use. What can I expect for my preventive care visit? Physical exam Your health care provider will check:  Height and weight. These may be used to calculate body mass index (BMI), which is a measurement that tells if you are at a healthy weight.  Heart rate and blood pressure.  Your skin for abnormal spots. Counseling Your health care provider may ask you questions about:  Alcohol, tobacco, and drug use.  Emotional well-being.  Home and relationship well-being.  Sexual activity.  Eating habits.  Work and work environment. What immunizations do I need?  Influenza (flu) vaccine  This is recommended every year. Tetanus, diphtheria, and pertussis (Tdap) vaccine  You may need a Td booster every 10 years. Varicella (chickenpox) vaccine  You may need this vaccine if you have not already been vaccinated. Zoster (shingles) vaccine  You may need this after age 60. Measles, mumps, and rubella (MMR) vaccine  You may need at least one dose of MMR if you were born in 1957 or later. You may also need a second dose. Pneumococcal conjugate (PCV13) vaccine  You may need this if you have certain conditions and were not previously vaccinated. Pneumococcal polysaccharide (PPSV23) vaccine  You may need one or two doses if you smoke cigarettes or if you have certain conditions. Meningococcal conjugate (MenACWY) vaccine  You may need this if you have certain conditions. Hepatitis A vaccine   You may need this if you have certain conditions or if you travel or work in places where you may be exposed to hepatitis A. Hepatitis B vaccine  You may need this if you have certain conditions or if you travel or work in places where you may be exposed to hepatitis B. Haemophilus influenzae type b (Hib) vaccine  You may need this if you have certain risk factors. Human papillomavirus (HPV) vaccine  If recommended by your health care provider, you may need three doses over 6 months. You may receive vaccines as individual doses or as more than one vaccine together in one shot (combination vaccines). Talk with your health care provider about the risks and benefits of combination vaccines. What tests do I need? Blood tests  Lipid and cholesterol levels. These may be checked every 5 years, or more frequently if you are over 50 years old.  Hepatitis C test.  Hepatitis B test. Screening  Lung cancer screening. You may have this screening every year starting at age 55 if you have a 30-pack-year history of smoking and currently smoke or have quit within the past 15 years.  Prostate cancer screening. Recommendations will vary depending on your family history and other risks.  Colorectal cancer screening. All adults should have this screening starting at age 50 and continuing until age 75. Your health care provider may recommend screening at age 45 if you are at increased risk. You will have tests every 1-10 years, depending on your results and the type of screening test.  Diabetes screening. This is done by checking your blood sugar (glucose) after you have not eaten   for a while (fasting). You may have this done every 1-3 years.  Sexually transmitted disease (STD) testing. Follow these instructions at home: Eating and drinking  Eat a diet that includes fresh fruits and vegetables, whole grains, lean protein, and low-fat dairy products.  Take vitamin and mineral supplements as recommended  by your health care provider.  Do not drink alcohol if your health care provider tells you not to drink.  If you drink alcohol: ? Limit how much you have to 0-2 drinks a day. ? Be aware of how much alcohol is in your drink. In the [place] one drink equals one 12 oz bottle of beer (355 mL), one 5 oz glass of wine (148 mL), or one 1 oz glass of hard liquor (44 mL). Lifestyle  Take daily care of your teeth and gums.  Stay active. Exercise for at least 30 minutes on 5 or more days each week.  Do not use any products that contain nicotine or tobacco, such as cigarettes, e-cigarettes, and chewing tobacco. If you need help quitting, ask your health care provider.  If you are sexually active, practice safe sex. Use a condom or other form of protection to prevent STIs (sexually transmitted infections).  Talk with your health care provider about taking a low-dose aspirin every day starting at age 33. What's next?  Go to your health care provider once a year for a well check visit.  Ask your health care provider how often you should have your eyes and teeth checked.  Stay up to date on all vaccines. This information is not intended to replace advice given to you by your health care provider. Make sure you discuss any questions you have with your health care provider. Document Released: 04/23/2015 Document Revised: 03/21/2018 Document Reviewed: 03/21/2018 Elsevier Patient Education  2020 [employer].
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
## 2019-05-01 ENCOUNTER — Other Ambulatory Visit: Payer: Self-pay | Admitting: Family Medicine

## 2019-05-01 DIAGNOSIS — I10 Essential (primary) hypertension: Secondary | ICD-10-CM

## 2019-05-01 DIAGNOSIS — E785 Hyperlipidemia, unspecified: Secondary | ICD-10-CM

## 2019-05-01 NOTE — Telephone Encounter (Signed)
Forwarding medication refill requests to the clinical pool for review. 

## 2019-05-14 ENCOUNTER — Ambulatory Visit (INDEPENDENT_AMBULATORY_CARE_PROVIDER_SITE_OTHER): Payer: PPO | Admitting: Family Medicine

## 2019-05-14 ENCOUNTER — Encounter: Payer: Self-pay | Admitting: Family Medicine

## 2019-05-14 ENCOUNTER — Other Ambulatory Visit: Payer: Self-pay

## 2019-05-14 VITALS — BP 113/76 | HR 75 | Temp 97.9°F | Resp 17 | Ht 69.0 in | Wt 211.2 lb

## 2019-05-14 DIAGNOSIS — I10 Essential (primary) hypertension: Secondary | ICD-10-CM | POA: Diagnosis not present

## 2019-05-14 DIAGNOSIS — Z760 Encounter for issue of repeat prescription: Secondary | ICD-10-CM | POA: Diagnosis not present

## 2019-05-14 DIAGNOSIS — E782 Mixed hyperlipidemia: Secondary | ICD-10-CM

## 2019-05-14 DIAGNOSIS — D649 Anemia, unspecified: Secondary | ICD-10-CM | POA: Diagnosis not present

## 2019-05-14 MED ORDER — PRAVASTATIN SODIUM 80 MG PO TABS: 80.0000 mg | ORAL_TABLET | Freq: Every day | ORAL | 3 refills | Status: DC

## 2019-05-14 MED ORDER — METOPROLOL SUCCINATE ER 50 MG PO TB24: 50.0000 mg | ORAL_TABLET | Freq: Every day | ORAL | 1 refills | Status: DC

## 2019-05-14 NOTE — Progress Notes (Signed)
Established Patient Office Visit  Subjective:  Patient ID: Jesse Bell, male    DOB: 1960-05-05  Age: 59 y.o. MRN: [id number]  CC:  Chief Complaint  Patient presents with  . Hypertension    6 month recheck  . Medication Refill    pravastatin    HPI FINAS DELONE presents for   Hypertension: Patient here for follow-up of elevated blood pressure. He is exercising and is adherent to low salt diet.  Blood pressure is well controlled at home. Cardiac symptoms none. Patient denies chest pain, chest pressure/discomfort, dyspnea, irregular heart beat and lower extremity edema.  Cardiovascular risk factors: hypertension and male gender. Use of agents associated with hypertension: none. History of target organ damage: none. BP Readings from Last 3 Encounters:  05/14/19 113/76  12/19/18 110/78  11/13/18 110/78   Dyslipidemia: Patient presents for evaluation of lipids.  Compliance with treatment thus far has been excellent.  A repeat fasting lipid profile was done.  The patient does not use medications that may worsen dyslipidemias (corticosteroids, progestins, anabolic steroids, diuretics, beta-blockers, amiodarone, cyclosporine, olanzapine). The patient exercises three times a week.  The patient is not known to have coexisting coronary artery disease.   The 10-year ASCVD risk score Mikey Bussing DC Brooke Bonito., et al., 2013) is: 8.9%   Values used to calculate the score:     Age: 68 years     Sex: Male     Is Non-Hispanic African American: No     Diabetic: No     Tobacco smoker: No     Systolic Blood Pressure: 920 mmHg     Is BP treated: Yes     HDL Cholesterol: 47 mg/dL     Total Cholesterol: 229 mg/dL  Mild anemia Patient states that he does not feel any symptoms of palpitations or fatigue Lab Results  Component Value Date   WBC 7.1 05/16/2018   HGB 12.3 (L) 05/16/2018   HCT 38.0 05/16/2018   MCV 86 05/16/2018   PLT 372 05/16/2018     Past Medical History:  Diagnosis Date  . Back pain     . Hypertension     Past Surgical History:  Procedure Laterality Date  . FRACTURE SURGERY    . JOINT REPLACEMENT      Family History  Problem Relation Age of Onset  . Pulmonary fibrosis Father   . Pulmonary fibrosis Brother     Social History   Socioeconomic History  . Marital status: Divorced    Spouse name: Not on file  . Number of children: 1  . Years of education: Not on file  . Highest education level: Not on file  Occupational History  . Not on file  Tobacco Use  . Smoking status: Never Smoker  . Smokeless tobacco: Never Used  Substance and Sexual Activity  . Alcohol use: Yes    Alcohol/week: 1.0 - 2.0 standard drinks    Types: 1 - 2 Cans of beer per week  . Drug use: No  . Sexual activity: Not Currently  Other Topics Concern  . Not on file  Social History Narrative  . Not on file   Social Determinants of Health   Financial Resource Strain:   . Difficulty of Paying Living Expenses: Not on file  Food Insecurity:   . Worried About Charity fundraiser in the Last Year: Not on file  . Ran Out of Food in the Last Year: Not on file  Transportation Needs:   . Lack  of Transportation (Medical): Not on file  . Lack of Transportation (Non-Medical): Not on file  Physical Activity:   . Days of Exercise per Week: Not on file  . Minutes of Exercise per Session: Not on file  Stress:   . Feeling of Stress : Not on file  Social Connections:   . Frequency of Communication with Friends and Family: Not on file  . Frequency of Social Gatherings with Friends and Family: Not on file  . Attends Religious Services: Not on file  . Active Member of Clubs or Organizations: Not on file  . Attends Archivist Meetings: Not on file  . Marital Status: Not on file  Intimate Partner Violence:   . Fear of Current or Ex-Partner: Not on file  . Emotionally Abused: Not on file  . Physically Abused: Not on file  . Sexually Abused: Not on file    Outpatient Medications  Prior to Visit  Medication Sig Dispense Refill  . albuterol (PROVENTIL HFA;VENTOLIN HFA) 108 (90 Base) MCG/ACT inhaler Inhale 2 puffs into the lungs every 4 (four) hours as needed for wheezing or shortness of breath (cough, shortness of breath or wheezing.). 1 Inhaler 1  . budesonide-formoterol (SYMBICORT) 160-4.5 MCG/ACT inhaler Inhale 2 puffs into the lungs 2 (two) times daily. 1 Inhaler 5  . chlorpheniramine (CHLOR-TRIMETON) 4 MG tablet Take 8 mg by mouth 3 (three) times daily.     Marland Kitchen DEXAMETHASONE PO Take 0.5 tablets by mouth daily as needed (for back and hip pain).     Marland Kitchen etodolac (LODINE) 400 MG tablet TAKE 1 TABLET(400 MG) BY MOUTH DAILY 90 tablet 1  . montelukast (SINGULAIR) 10 MG tablet Take 1 tablet (10 mg total) by mouth at bedtime. 90 tablet 3  . olmesartan (BENICAR) 20 MG tablet TAKE 1 TABLET(20 MG) BY MOUTH DAILY 90 tablet 1  . traZODone (DESYREL) 50 MG tablet Take 0.5-3 tablets (25-150 mg total) by mouth at bedtime as needed for sleep. 60 tablet 1  . metoprolol succinate (TOPROL-XL) 50 MG 24 hr tablet Take 1 tablet (50 mg total) by mouth daily. Take with or immediately following a meal. 90 tablet 1  . pravastatin (PRAVACHOL) 80 MG tablet TAKE 1 TABLET(80 MG) BY MOUTH DAILY 90 tablet 1  . budesonide-formoterol (SYMBICORT) 160-4.5 MCG/ACT inhaler Inhale 2 puffs into the lungs 2 (two) times daily for 1 day. 1 Inhaler 0   No facility-administered medications prior to visit.    Allergies  Allergen Reactions  . Lisinopril Cough    COUGH    ROS Review of Systems Review of Systems  Constitutional: Negative for activity change, appetite change, chills and fever.  HENT: Negative for congestion, nosebleeds, trouble swallowing and voice change.   Respiratory: Negative for cough, shortness of breath and wheezing.   Gastrointestinal: Negative for diarrhea, nausea and vomiting.  Genitourinary: Negative for difficulty urinating, dysuria, flank pain and hematuria.  Musculoskeletal:  Negative for back pain, joint swelling and neck pain.  Neurological: Negative for dizziness, speech difficulty, light-headedness and numbness.  See HPI. All other review of systems negative.     Objective:    Physical Exam  BP 113/76 (BP Location: Right Arm, Patient Position: Sitting, Cuff Size: Normal)   Pulse 75   Temp 97.9 F (36.6 C) (Oral)   Resp 17   Ht '5\' 9"'$  (1.753 m)   Wt 211 lb 3.2 oz (95.8 kg)   SpO2 95%   BMI 31.19 kg/m  Wt Readings from Last 3 Encounters:  05/14/19  211 lb 3.2 oz (95.8 kg)  12/19/18 197 lb (89.4 kg)  11/13/18 197 lb (89.4 kg)   Physical Exam  Constitutional: Oriented to person, place, and time. Appears well-developed and well-nourished.  HENT:  Head: Normocephalic and atraumatic.  Eyes: Conjunctivae and EOM are normal.  Cardiovascular: Normal rate, regular rhythm, normal heart sounds and intact distal pulses.  No murmur heard. Pulmonary/Chest: Effort normal and breath sounds normal. No stridor. No respiratory distress. Has no wheezes.  Neurological: Is alert and oriented to person, place, and time.  Skin: Skin is warm. Capillary refill takes less than 2 seconds.  Psychiatric: Has a normal mood and affect. Behavior is normal. Judgment and thought content normal.    Health Maintenance Due  Topic Date Due  . INFLUENZA VACCINE  11/09/2018    There are no preventive care reminders to display for this patient.  Lab Results  Component Value Date   TSH 2.040 05/16/2018   Lab Results  Component Value Date   WBC 7.1 05/16/2018   HGB 12.3 (L) 05/16/2018   HCT 38.0 05/16/2018   MCV 86 05/16/2018   PLT 372 05/16/2018   Lab Results  Component Value Date   NA 138 11/13/2018   K 4.9 11/13/2018   CO2 22 11/13/2018   GLUCOSE 96 11/13/2018   BUN 14 11/13/2018   CREATININE 0.88 11/13/2018   BILITOT 0.3 11/13/2018   ALKPHOS 50 11/13/2018   AST 21 11/13/2018   ALT 13 11/13/2018   PROT 7.1 11/13/2018   ALBUMIN 4.7 11/13/2018   CALCIUM 9.0  11/13/2018   Lab Results  Component Value Date   CHOL 229 (H) 11/13/2018   Lab Results  Component Value Date   HDL 47 11/13/2018   Lab Results  Component Value Date   LDLCALC 145 (H) 11/13/2018   Lab Results  Component Value Date   TRIG 187 (H) 11/13/2018   Lab Results  Component Value Date   CHOLHDL 4.9 11/13/2018   No results found for: HGBA1C    Assessment & Plan:   Problem List Items Addressed This Visit      Cardiovascular and Mediastinum   Hypertension   Relevant Medications   metoprolol succinate (TOPROL-XL) 50 MG 24 hr tablet   pravastatin (PRAVACHOL) 80 MG tablet   Other Relevant Orders   CMP14+EGFR   Lipid panel   CBC     Other   Mixed hyperlipidemia - Primary   Relevant Medications   metoprolol succinate (TOPROL-XL) 50 MG 24 hr tablet   pravastatin (PRAVACHOL) 80 MG tablet   Other Relevant Orders   CMP14+EGFR   Lipid panel   CBC    Other Visit Diagnoses    Mild anemia       Relevant Orders   CBC   Hyperlipidemia, unspecified hyperlipidemia type       Relevant Medications   metoprolol succinate (TOPROL-XL) 50 MG 24 hr tablet   pravastatin (PRAVACHOL) 80 MG tablet     Hypertension  -  Stable, continue current meds Refilled meds today    Hyperlipidemia-  Discussed statin refill, no side effects, continue current diet and exercise   Mild anemia -  Will assess    Meds ordered this encounter  Medications  . metoprolol succinate (TOPROL-XL) 50 MG 24 hr tablet    Sig: Take 1 tablet (50 mg total) by mouth daily. Take with or immediately following a meal.    Dispense:  90 tablet    Refill:  1  . pravastatin (PRAVACHOL)  80 MG tablet    Sig: Take 1 tablet (80 mg total) by mouth daily.    Dispense:  90 tablet    Refill:  3    Follow-up: No follow-ups on file.    Forrest Moron, MD

## 2019-05-14 NOTE — Patient Instructions (Signed)

## 2019-05-15 ENCOUNTER — Ambulatory Visit: Payer: PPO | Admitting: Family Medicine

## 2019-05-15 LAB — CMP14+EGFR
ALT: 25 IU/L (ref 0–44)
AST: 36 IU/L (ref 0–40)
Albumin/Globulin Ratio: 1.9 (ref 1.2–2.2)
Albumin: 4.7 g/dL (ref 3.8–4.9)
Alkaline Phosphatase: 61 IU/L (ref 39–117)
BUN/Creatinine Ratio: 16 (ref 9–20)
BUN: 14 mg/dL (ref 6–24)
Bilirubin Total: 0.4 mg/dL (ref 0.0–1.2)
CO2: 22 mmol/L (ref 20–29)
Calcium: 9.5 mg/dL (ref 8.7–10.2)
Chloride: 100 mmol/L (ref 96–106)
Creatinine, Ser: 0.88 mg/dL (ref 0.76–1.27)
GFR calc Af Amer: 109 mL/min/{1.73_m2} (ref >59)
GFR calc non Af Amer: 94 mL/min/{1.73_m2} (ref >59)
Globulin, Total: 2.5 g/dL (ref 1.5–4.5)
Glucose: 91 mg/dL (ref 65–99)
Potassium: 5.1 mmol/L (ref 3.5–5.2)
Sodium: 139 mmol/L (ref 134–144)
Total Protein: 7.2 g/dL (ref 6.0–8.5)

## 2019-05-15 LAB — CBC
Hematocrit: 43.3 % (ref 37.5–51.0)
Hemoglobin: 13.7 g/dL (ref 13.0–17.7)
MCH: 27.4 pg (ref 26.6–33.0)
MCHC: 31.6 g/dL (ref 31.5–35.7)
MCV: 87 fL (ref 79–97)
Platelets: 407 10*3/uL (ref 150–450)
RBC: 5 x10E6/uL (ref 4.14–5.80)
RDW: 13.1 % (ref 11.6–15.4)
WBC: 8.2 10*3/uL (ref 3.4–10.8)

## 2019-05-15 LAB — LIPID PANEL
Chol/HDL Ratio: 5 ratio (ref 0.0–5.0)
Cholesterol, Total: 237 mg/dL — ABNORMAL HIGH (ref 100–199)
HDL: 47 mg/dL (ref >39)
LDL Chol Calc (NIH): 142 mg/dL — ABNORMAL HIGH (ref 0–99)
Triglycerides: 263 mg/dL — ABNORMAL HIGH (ref 0–149)
VLDL Cholesterol Cal: 48 mg/dL — ABNORMAL HIGH (ref 5–40)

## 2019-06-10 ENCOUNTER — Other Ambulatory Visit: Payer: Self-pay | Admitting: Family Medicine

## 2019-06-10 DIAGNOSIS — E782 Mixed hyperlipidemia: Secondary | ICD-10-CM

## 2019-06-10 MED ORDER — ROSUVASTATIN CALCIUM 40 MG PO TABS: 40.0000 mg | ORAL_TABLET | Freq: Every day | ORAL | 3 refills | Status: DC

## 2019-07-22 ENCOUNTER — Other Ambulatory Visit: Payer: Self-pay | Admitting: Family Medicine

## 2019-07-22 DIAGNOSIS — M1990 Unspecified osteoarthritis, unspecified site: Secondary | ICD-10-CM

## 2019-07-22 NOTE — Telephone Encounter (Signed)
Requested Prescriptions  Pending Prescriptions Disp Refills  . etodolac (LODINE) 400 MG tablet [Pharmacy Med Name: ETODOLAC 400MG  TABLETS] 90 tablet 1    Sig: TAKE 1 TABLET(400 MG) BY MOUTH DAILY     Analgesics:  NSAIDS Passed - 07/22/2019 11:43 AM      Passed - Cr in normal range and within 360 days    Creat  Date Value Ref Range Status  02/25/2016 0.87 0.70 - 1.33 mg/dL Final    Comment:      For patients > or = 60 years of age: The upper reference limit for Creatinine is approximately 13% higher for people identified as African-American.      Creatinine, Ser  Date Value Ref Range Status  05/14/2019 0.88 0.76 - 1.27 mg/dL Final         Passed - HGB in normal range and within 360 days    Hemoglobin  Date Value Ref Range Status  05/14/2019 13.7 13.0 - 17.7 g/dL Final         Passed - Patient is not pregnant      Passed - Valid encounter within last 12 months    Recent Outpatient Visits          2 months ago Mixed hyperlipidemia   [facility] at [facility], Arlie Solomons, MD   7 months ago Medicare annual wellness visit, subsequent   [facility] at [facility], Lilia Argue, MD   8 months ago Mixed hyperlipidemia   Primary Care at [facility], Arlie Solomons, MD   11 months ago Leg swelling   [facility] at [facility], Delfino Lovett, NP   1 year ago Mixed hyperlipidemia   Primary Care at [facility], Arlie Solomons, MD

## 2019-10-14 ENCOUNTER — Telehealth: Payer: Self-pay | Admitting: Family Medicine

## 2019-10-14 NOTE — Telephone Encounter (Signed)
I called pt and scheduled an appointment.

## 2019-10-14 NOTE — Telephone Encounter (Signed)
Pt brought Disability Parking Placard form  in for Dr. Nolon Rod to fill out. I have placed form in Dr. Ardyth Gal red box in doctor's lounge. Pt would like to be called when this form is complete. [phone]

## 2019-10-14 NOTE — Telephone Encounter (Signed)
Pt will need an appointment before we can fill out this paperwork as they have not had an appointment with [place] and their last visit did not pertain to this. If you could schedule this for the pt that would be appreciated.

## 2019-10-16 ENCOUNTER — Encounter: Payer: Self-pay | Admitting: Family Medicine

## 2019-10-16 ENCOUNTER — Other Ambulatory Visit: Payer: Self-pay

## 2019-10-16 ENCOUNTER — Ambulatory Visit (INDEPENDENT_AMBULATORY_CARE_PROVIDER_SITE_OTHER): Payer: PPO

## 2019-10-16 ENCOUNTER — Ambulatory Visit (INDEPENDENT_AMBULATORY_CARE_PROVIDER_SITE_OTHER): Payer: PPO | Admitting: Family Medicine

## 2019-10-16 VITALS — BP 114/73 | HR 71 | Temp 97.7°F | Ht 69.0 in | Wt 198.0 lb

## 2019-10-16 DIAGNOSIS — R0982 Postnasal drip: Secondary | ICD-10-CM | POA: Diagnosis not present

## 2019-10-16 DIAGNOSIS — R062 Wheezing: Secondary | ICD-10-CM

## 2019-10-16 DIAGNOSIS — G8921 Chronic pain due to trauma: Secondary | ICD-10-CM

## 2019-10-16 DIAGNOSIS — I10 Essential (primary) hypertension: Secondary | ICD-10-CM

## 2019-10-16 DIAGNOSIS — R0602 Shortness of breath: Secondary | ICD-10-CM | POA: Diagnosis not present

## 2019-10-16 MED ORDER — IPRATROPIUM BROMIDE 0.03 % NA SOLN: 2.0000 | Freq: Two times a day (BID) | NASAL | 5 refills | Status: DC | PRN

## 2019-10-16 MED ORDER — ALBUTEROL SULFATE HFA 108 (90 BASE) MCG/ACT IN AERS: 2.0000 | INHALATION_SPRAY | Freq: Four times a day (QID) | RESPIRATORY_TRACT | 2 refills | Status: DC | PRN
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
## 2019-10-16 NOTE — Patient Instructions (Signed)
° ° ° °  If you have lab work done today you will be contacted with your lab results within the next 2 weeks.  If you have not heard from us then please contact us. The fastest way to get your results is to register for My Chart. ° ° °IF you received an x-ray today, you will receive an invoice from [facility]. Please contact  Radiology at [phone] with questions or concerns regarding your invoice.  ° °IF you received labwork today, you will receive an invoice from [facility]. Please contact [facility] at [phone] with questions or concerns regarding your invoice.  ° °Our billing staff will not be able to assist you with questions regarding bills from these companies. ° °You will be contacted with the lab results as soon as they are available. The fastest way to get your results is to activate your My Chart account. Instructions are located on the last page of this paperwork. If you have not heard from us regarding the results in 2 weeks, please contact this office. °  ° ° ° °

## 2019-11-21 ENCOUNTER — Other Ambulatory Visit: Payer: Self-pay

## 2019-11-21 ENCOUNTER — Telehealth: Payer: Self-pay

## 2019-11-21 ENCOUNTER — Telehealth: Payer: Self-pay | Admitting: Family Medicine

## 2019-11-21 DIAGNOSIS — I10 Essential (primary) hypertension: Secondary | ICD-10-CM

## 2019-11-21 NOTE — Telephone Encounter (Signed)
Pt needs a refills on What is the name of the medication? olmesartan (BENICAR) 20 MG tablet [[id number]   Have you contacted your pharmacy to request a refill?y   Which pharmacy would you like this sent to?  [id number] [address], [place] - [address]  [place], [place] [address]  Phone:  [phone] Fax:  [phone]   Patient notified that their request is being sent to the clinical staff for review and that they should receive a call once it is complete. If they do not receive a call within 72 hours they can check with their pharmacy or our office.

## 2019-11-24 MED ORDER — OLMESARTAN MEDOXOMIL 20 MG PO TABS: 20.0000 mg | ORAL_TABLET | Freq: Every day | ORAL | 1 refills | Status: DC

## 2019-11-26 NOTE — Telephone Encounter (Signed)
No Additional notes required

## 2019-12-21 DIAGNOSIS — Z20828 Contact with and (suspected) exposure to other viral communicable diseases: Secondary | ICD-10-CM | POA: Diagnosis not present

## 2019-12-22 ENCOUNTER — Ambulatory Visit: Payer: PPO

## 2019-12-22 ENCOUNTER — Ambulatory Visit: Payer: Self-pay

## 2019-12-22 NOTE — Telephone Encounter (Signed)
Spoke with pt. Pt will wait for COVID-19 test result and contact us to schedule and appt once we know if it's positive or negative. Pt was advised to get plenty of rest and if symptoms become to bad to go to the ER or at minimum his local urgent care.

## 2019-12-22 NOTE — Telephone Encounter (Signed)
Returned call to patient who states that he has cough chills headache fatigue that started Friday. He states that yesterday he ws tested at [facility] for COVID-19 but is still waiting for result. He states that his chest feels heavy and congested.  He has a cough. Yesterday he lost his voice but is better today.  In general he feels his symptoms are the same as yesterday. He has not checked his temperature but is having chills. He is drinking fluids and has not lost taste or smell. He has Hx of asthma. He had his pfizer vaccines in April. Call was not place to office because office is at lunch Per protocol patient will wait for call back from office. Care advice that included 911 if breathing becomes difficult. He verbalized understanding of all information  Reason for Disposition . [1] HIGH RISK patient (e.g., age > 66 years, diabetes, heart or lung disease, weak immune system) AND [2] new or worsening symptoms  Answer Assessment - Initial Assessment Questions 1. COVID-19 DIAGNOSIS: "Who made your Coronavirus (COVID-19) diagnosis?" "Was it confirmed by a positive lab test?" If not diagnosed by a HCP, ask "Are there lots of cases (community spread) where you live?" (See public health department website, if unsure)    [place] 2. COVID-19 EXPOSURE: "Was there any known exposure to COVID before the symptoms began?" CDC Definition of close contact: within 6 feet (2 meters) for a total of 15 minutes or more over a 24-hour period.     unsure 3. ONSET: "When did the COVID-19 symptoms start?"      This past weekend friday 4. WORST SYMPTOM: "What is your worst symptom?" (e.g., cough, fever, shortness of breath, muscle aches)     Chest heavy cough 5. COUGH: "Do you have a cough?" If Yes, ask: "How bad is the cough?"      yes 6. FEVER: "Do you have a fever?" If Yes, ask: "What is your temperature, how was it measured, and when did it start?"     Feverish not checked 7. RESPIRATORY STATUS: "Describe your  breathing?" (e.g., shortness of breath, wheezing, unable to speak)     yes 8. BETTER-SAME-WORSE: "Are you getting better, staying the same or getting worse compared to yesterday?"  If getting worse, ask, "In what way?"    same 9. HIGH RISK DISEASE: "Do you have any chronic medical problems?" (e.g., asthma, heart or lung disease, weak immune system, obesity, etc.)    asthma 10. PREGNANCY: "Is there any chance you are pregnant?" "When was your last menstrual period?"      N/A 11. OTHER SYMPTOMS: "Do you have any other symptoms?"  (e.g., chills, fatigue, headache, loss of smell or taste, muscle pain, sore throat; new loss of smell or taste especially support the diagnosis of COVID-19)      Chills, fatigue, headache, sore throat  Protocols used: CORONAVIRUS (COVID-19) DIAGNOSED OR SUSPECTED-A-AH

## 2019-12-23 ENCOUNTER — Encounter: Payer: Self-pay | Admitting: Family Medicine

## 2019-12-23 ENCOUNTER — Telehealth (INDEPENDENT_AMBULATORY_CARE_PROVIDER_SITE_OTHER): Payer: PPO | Admitting: Family Medicine

## 2019-12-23 ENCOUNTER — Other Ambulatory Visit: Payer: Self-pay

## 2019-12-23 VITALS — Temp 100.0°F | Ht 69.0 in | Wt 190.0 lb

## 2019-12-23 DIAGNOSIS — J45901 Unspecified asthma with (acute) exacerbation: Secondary | ICD-10-CM | POA: Diagnosis not present

## 2019-12-23 DIAGNOSIS — J22 Unspecified acute lower respiratory infection: Secondary | ICD-10-CM

## 2019-12-23 MED ORDER — AZITHROMYCIN 250 MG PO TABS: ORAL_TABLET | ORAL | 0 refills | Status: AC

## 2019-12-23 MED ORDER — PREDNISONE 20 MG PO TABS: 40.0000 mg | ORAL_TABLET | Freq: Every day | ORAL | 0 refills | Status: DC

## 2019-12-23 MED ORDER — AMOXICILLIN 500 MG PO CAPS: 1000.0000 mg | ORAL_CAPSULE | Freq: Three times a day (TID) | ORAL | 0 refills | Status: DC

## 2019-12-23 NOTE — Patient Instructions (Signed)
° ° ° °  If you have lab work done today you will be contacted with your lab results within the next 2 weeks.  If you have not heard from us then please contact us. The fastest way to get your results is to register for My Chart. ° ° °IF you received an x-ray today, you will receive an invoice from [facility]. Please contact [facility] at [phone] with questions or concerns regarding your invoice.  ° °IF you received labwork today, you will receive an invoice from [facility]. Please contact [facility] at [phone] with questions or concerns regarding your invoice.  ° °Our billing staff will not be able to assist you with questions regarding bills from these companies. ° °You will be contacted with the lab results as soon as they are available. The fastest way to get your results is to activate your My Chart account. Instructions are located on the last page of this paperwork. If you have not heard from us regarding the results in 2 weeks, please contact this office. °  ° ° ° °

## 2019-12-23 NOTE — Progress Notes (Signed)
Virtual Visit Note  I connected with patient on 12/23/19 at 527pm by video epic and verified that I am speaking with the correct person using two identifiers. Jesse Bell is currently located at home and patient is currently with them during visit. The provider, Rutherford Guys, MD is located in their office at time of visit.  I discussed the limitations, risks, security and privacy concerns of performing an evaluation and management service by telephone and the availability of in person appointments. I also discussed with the patient that there may be a patient responsible charge related to this service. The patient expressed understanding and agreed to proceed.   I provided 10 minutes of non-face-to-face time during this encounter.  Chief Complaint  Patient presents with  . flu like symptom    tempature was 100.0 sinus issuse,headache, chest feels heavy and been coughing alot, little sob.  This has been going on since friday night. have been taking cough med and tyleno. Loss voice and sob. Had covid test on 12/21/19 which was neg as of yday. Have had covid shots. Have not had flu shot     HPI ? Patient reports 5 days of fevers, headaches, worsening productive cough, wheezing, chest tightness and mild SOB No chest pain, palpitations, sinus pain, ear pain, sore throat, abd pain, n/v, diarrhea, dysuria He has been taking tylenol and cough meds  Albuterol helps with wheezing Temp today 100.0, he had taken APAP earlier Negative covid test No sick contacts Non smoker H/o asthma  Allergies  Allergen Reactions  . Lisinopril Cough    COUGH    Prior to Admission medications   Medication Sig Start Date End Date Taking? Authorizing Provider  albuterol (VENTOLIN HFA) 108 (90 Base) MCG/ACT inhaler Inhale 2 puffs into the lungs every 6 (six) hours as needed for wheezing or shortness of breath (cough, shortness of breath or wheezing.). 10/16/19  Yes Rutherford Guys, MD  chlorpheniramine  (CHLOR-TRIMETON) 4 MG tablet Take 8 mg by mouth 3 (three) times daily.    Yes [provider]  DEXAMETHASONE PO Take 0.5 tablets by mouth daily as needed (for back and hip pain).    Yes [provider]  etodolac (LODINE) 400 MG tablet TAKE 1 TABLET(400 MG) BY MOUTH DAILY 07/22/19  Yes Stallings, Zoe A, MD  ipratropium (ATROVENT) 0.03 % nasal spray Place 2 sprays into both nostrils 2 (two) times daily as needed for rhinitis. 10/16/19  Yes Rutherford Guys, MD  metoprolol succinate (TOPROL-XL) 50 MG 24 hr tablet Take 1 tablet (50 mg total) by mouth daily. Take with or immediately following a meal. 05/14/19  Yes Stallings, Zoe A, MD  montelukast (SINGULAIR) 10 MG tablet Take 1 tablet (10 mg total) by mouth at bedtime. 11/13/18  Yes Stallings, Zoe A, MD  olmesartan (BENICAR) 20 MG tablet Take 1 tablet (20 mg total) by mouth daily. 11/24/19  Yes Rutherford Guys, MD  rosuvastatin (CRESTOR) 40 MG tablet Take 1 tablet (40 mg total) by mouth daily. 06/10/19  Yes Forrest Moron, MD  traZODone (DESYREL) 50 MG tablet Take 0.5-3 tablets (25-150 mg total) by mouth at bedtime as needed for sleep. 11/13/18  Yes Forrest Moron, MD    Past Medical History:  Diagnosis Date  . Back pain   . Hypertension     Past Surgical History:  Procedure Laterality Date  . FRACTURE SURGERY    . JOINT REPLACEMENT      Social History   Tobacco  Use  . Smoking status: Never Smoker  . Smokeless tobacco: Never Used  Substance Use Topics  . Alcohol use: Yes    Alcohol/week: 1.0 - 2.0 standard drink    Types: 1 - 2 Cans of beer per week    Family History  Problem Relation Age of Onset  . Pulmonary fibrosis Father   . Pulmonary fibrosis Brother     ROS Per hpi  Objective  Vitals as reported by the patient: per above  GEN: AAOx3, NAD HEENT: Dawn/AT, pupils are symmetrical, EOMI, non-icteric sclera Resp: hoarse, no respiratory distress, speaking in full sentences Skin: no rashes noted, no pallor,  appears mildly diaphoretic Psych: good eye contact, normal mood and affect   ASSESSMENT and PLAN  1. Lower respiratory tract infection 2. Reactive airway disease with acute exacerbation, unspecified asthma severity, unspecified whether persistent Discussed supportive measures, new meds r/se/b and RTC precautions. Patient educational handout given.  Other orders - predniSONE (DELTASONE) 20 MG tablet; Take 2 tablets (40 mg total) by mouth daily with breakfast. - azithromycin (ZITHROMAX) 250 MG tablet; Take 2 tablets (500 mg total) by mouth daily for 1 day, THEN 1 tablet (250 mg total) daily for 4 days. - amoxicillin (AMOXIL) 500 MG capsule; Take 2 capsules (1,000 mg total) by mouth 3 (three) times daily.  FOLLOW-UP: prn   The above assessment and management plan was discussed with the patient. The patient verbalized understanding of and has agreed to the management plan. Patient is aware to call the clinic if symptoms persist or worsen. Patient is aware when to return to the clinic for a follow-up visit. Patient educated on when it is appropriate to go to the emergency department.     Rutherford Guys, MD Primary Care at [address] [place], [place] [address] Ph.  [phone] Fax [phone]

## 2019-12-25 ENCOUNTER — Telehealth: Payer: PPO | Admitting: Family Medicine

## 2020-02-20 ENCOUNTER — Telehealth: Payer: Self-pay | Admitting: Registered Nurse

## 2020-02-20 NOTE — Telephone Encounter (Signed)
Error

## 2020-02-24 ENCOUNTER — Other Ambulatory Visit: Payer: Self-pay

## 2020-02-24 ENCOUNTER — Encounter: Payer: Self-pay | Admitting: Registered Nurse

## 2020-02-24 ENCOUNTER — Ambulatory Visit (INDEPENDENT_AMBULATORY_CARE_PROVIDER_SITE_OTHER): Payer: PPO | Admitting: Registered Nurse

## 2020-02-24 DIAGNOSIS — G479 Sleep disorder, unspecified: Secondary | ICD-10-CM | POA: Diagnosis not present

## 2020-02-24 DIAGNOSIS — I10 Essential (primary) hypertension: Secondary | ICD-10-CM

## 2020-02-24 MED ORDER — METOPROLOL SUCCINATE ER 50 MG PO TB24: 50.0000 mg | ORAL_TABLET | Freq: Every day | ORAL | 1 refills | Status: DC

## 2020-02-24 MED ORDER — TRAZODONE HCL 50 MG PO TABS: 25.0000 mg | ORAL_TABLET | Freq: Every evening | ORAL | 1 refills | Status: DC | PRN

## 2020-02-24 NOTE — Patient Instructions (Signed)
° ° ° °  If you have lab work done today you will be contacted with your lab results within the next 2 weeks.  If you have not heard from us then please contact us. The fastest way to get your results is to register for My Chart. ° ° °IF you received an x-ray today, you will receive an invoice from [facility]. Please contact [facility] at [phone] with questions or concerns regarding your invoice.  ° °IF you received labwork today, you will receive an invoice from [facility]. Please contact [facility] at [phone] with questions or concerns regarding your invoice.  ° °Our billing staff will not be able to assist you with questions regarding bills from these companies. ° °You will be contacted with the lab results as soon as they are available. The fastest way to get your results is to activate your My Chart account. Instructions are located on the last page of this paperwork. If you have not heard from us regarding the results in 2 weeks, please contact this office. °  ° ° ° °

## 2020-03-18 ENCOUNTER — Ambulatory Visit (INDEPENDENT_AMBULATORY_CARE_PROVIDER_SITE_OTHER): Payer: PPO | Admitting: Emergency Medicine

## 2020-03-18 VITALS — BP 112/68 | Ht 69.0 in | Wt 209.0 lb

## 2020-03-18 DIAGNOSIS — Z0000 Encounter for general adult medical examination without abnormal findings: Secondary | ICD-10-CM

## 2020-03-18 NOTE — Patient Instructions (Addendum)
Thank you for taking time to come for your Medicare Wellness Visit. I appreciate your ongoing commitment to your health goals. Please review the following plan we discussed and let me know if I can assist you in the future.  Leroy Kennedy LPN Advance Directive  Advance directives are legal documents that let you make choices ahead of time about your health care and medical treatment in case you become unable to communicate for yourself. Advance directives are a way for you to make known your wishes to family, friends, and health care providers. This can let others know about your end-of-life care if you become unable to communicate. Discussing and writing advance directives should happen over time rather than all at once. Advance directives can be changed depending on your situation and what you want, even after you have signed the advance directives. There are different types of advance directives, such as:  Medical power of attorney.  Living will.  Do not resuscitate (DNR) or do not attempt resuscitation (DNAR) order. Health care proxy and medical power of attorney A health care proxy is also called a health care agent. This is a person who is appointed to make medical decisions for you in cases where you are unable to make the decisions yourself. Generally, people choose someone they know well and trust to represent their preferences. Make sure to ask this person for an agreement to act as your proxy. A proxy may have to exercise judgment in the event of a medical decision for which your wishes are not known. A medical power of attorney is a legal document that names your health care proxy. Depending on the laws in your state, after the document is written, it may also need to be:  Signed.  Notarized.  Dated.  Copied.  Witnessed.  Incorporated into your medical record. You may also want to appoint someone to manage your money in a situation in which you are unable to do so. This is  called a durable power of attorney for finances. It is a separate legal document from the durable power of attorney for health care. You may choose the same person or someone different from your health care proxy to act as your agent in money matters. If you do not appoint a proxy, or if there is a concern that the proxy is not acting in your best interests, a court may appoint a guardian to act on your behalf. Living will A living will is a set of instructions that state your wishes about medical care when you cannot express them yourself. Health care providers should keep a copy of your living will in your medical record. You may want to give a copy to family members or friends. To alert caregivers in case of an emergency, you can place a card in your wallet to let them know that you have a living will and where they can find it. A living will is used if you become:  Terminally ill.  Disabled.  Unable to communicate or make decisions. Items to consider in your living will include:  To use or not to use life-support equipment, such as dialysis machines and breathing machines (ventilators).  A DNR or DNAR order. This tells health care providers not to use cardiopulmonary resuscitation (CPR) if breathing or heartbeat stops.  To use or not to use tube feeding.  To be given or not to be given food and fluids.  Comfort (palliative) care when the goal becomes comfort rather than a  a cure.  Donation of organs and tissues. A living will does not give instructions for distributing your money and property if you should pass away. DNR or DNAR A DNR or DNAR order is a request not to have CPR in the event that your heart stops beating or you stop breathing. If a DNR or DNAR order has not been made and shared, a health care provider will try to help any patient whose heart has stopped or who has stopped breathing. If you plan to have surgery, talk with your health care provider about how your DNR or DNAR  order will be followed if problems occur. What if I do not have an advance directive? If you do not have an advance directive, some states assign family decision makers to act on your behalf based on how closely you are related to them. Each state has its own laws about advance directives. You may want to check with your health care provider, attorney, or state representative about the laws in your state. Summary  Advance directives are the legal documents that allow you to make choices ahead of time about your health care and medical treatment in case you become unable to tell others about your care.  The process of discussing and writing advance directives should happen over time. You can change the advance directives, even after you have signed them.  Advance directives include DNR or DNAR orders, living wills, and designating an agent as your medical power of attorney. This information is not intended to replace advice given to you by your health care provider. Make sure you discuss any questions you have with your health care provider. Document Revised: 10/24/2018 Document Reviewed: 10/24/2018 Elsevier Patient Education  2020 [employer]   Preventive Care 40-64 Years Old, Male Preventive care refers to lifestyle choices and visits with your health care provider that can promote health and wellness. This includes:  A yearly physical exam. This is also called an annual well check.  Regular dental and eye exams.  Immunizations.  Screening for certain conditions.  Healthy lifestyle choices, such as eating a healthy diet, getting regular exercise, not using drugs or products that contain nicotine and tobacco, and limiting alcohol use. What can I expect for my preventive care visit? Physical exam Your health care provider will check:  Height and weight. These may be used to calculate body mass index (BMI), which is a measurement that tells if you are at a healthy weight.  Heart  rate and blood pressure.  Your skin for abnormal spots. Counseling Your health care provider may ask you questions about:  Alcohol, tobacco, and drug use.  Emotional well-being.  Home and relationship well-being.  Sexual activity.  Eating habits.  Work and work environment. What immunizations do I need?  Influenza (flu) vaccine  This is recommended every year. Tetanus, diphtheria, and pertussis (Tdap) vaccine  You may need a Td booster every 10 years. Varicella (chickenpox) vaccine  You may need this vaccine if you have not already been vaccinated. Zoster (shingles) vaccine  You may need this after age 60. Measles, mumps, and rubella (MMR) vaccine  You may need at least one dose of MMR if you were born in 1957 or later. You may also need a second dose. Pneumococcal conjugate (PCV13) vaccine  You may need this if you have certain conditions and were not previously vaccinated. Pneumococcal polysaccharide (PPSV23) vaccine  You may need one or two doses if you smoke cigarettes or if you have   certain conditions. Meningococcal conjugate (MenACWY) vaccine  You may need this if you have certain conditions. Hepatitis A vaccine  You may need this if you have certain conditions or if you travel or work in places where you may be exposed to hepatitis A. Hepatitis B vaccine  You may need this if you have certain conditions or if you travel or work in places where you may be exposed to hepatitis B. Haemophilus influenzae type b (Hib) vaccine  You may need this if you have certain risk factors. Human papillomavirus (HPV) vaccine  If recommended by your health care provider, you may need three doses over 6 months. You may receive vaccines as individual doses or as more than one vaccine together in one shot (combination vaccines). Talk with your health care provider about the risks and benefits of combination vaccines. What tests do I need? Blood tests  Lipid and cholesterol  levels. These may be checked every 5 years, or more frequently if you are over 50 years old.  Hepatitis C test.  Hepatitis B test. Screening  Lung cancer screening. You may have this screening every year starting at age 55 if you have a 30-pack-year history of smoking and currently smoke or have quit within the past 15 years.  Prostate cancer screening. Recommendations will vary depending on your family history and other risks.  Colorectal cancer screening. All adults should have this screening starting at age 50 and continuing until age 75. Your health care provider may recommend screening at age 45 if you are at increased risk. You will have tests every 1-10 years, depending on your results and the type of screening test.  Diabetes screening. This is done by checking your blood sugar (glucose) after you have not eaten for a while (fasting). You may have this done every 1-3 years.  Sexually transmitted disease (STD) testing. Follow these instructions at home: Eating and drinking  Eat a diet that includes fresh fruits and vegetables, whole grains, lean protein, and low-fat dairy products.  Take vitamin and mineral supplements as recommended by your health care provider.  Do not drink alcohol if your health care provider tells you not to drink.  If you drink alcohol: ? Limit how much you have to 0-2 drinks a day. ? Be aware of how much alcohol is in your drink. In the [place] one drink equals one 12 oz bottle of beer (355 mL), one 5 oz glass of wine (148 mL), or one 1 oz glass of hard liquor (44 mL). Lifestyle  Take daily care of your teeth and gums.  Stay active. Exercise for at least 30 minutes on 5 or more days each week.  Do not use any products that contain nicotine or tobacco, such as cigarettes, e-cigarettes, and chewing tobacco. If you need help quitting, ask your health care provider.  If you are sexually active, practice safe sex. Use a condom or other form of protection  to prevent STIs (sexually transmitted infections).  Talk with your health care provider about taking a low-dose aspirin every day starting at age 50. What's next?  Go to your health care provider once a year for a well check visit.  Ask your health care provider how often you should have your eyes and teeth checked.  Stay up to date on all vaccines. This information is not intended to replace advice given to you by your health care provider. Make sure you discuss any questions you have with your health care provider. Document Revised:   Document Reviewed: 03/21/2018 Elsevier Patient Education  [employer].
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
## 2020-04-30 ENCOUNTER — Other Ambulatory Visit: Payer: Self-pay | Admitting: Emergency Medicine

## 2020-04-30 DIAGNOSIS — E782 Mixed hyperlipidemia: Secondary | ICD-10-CM

## 2020-04-30 MED ORDER — ROSUVASTATIN CALCIUM 40 MG PO TABS: 40.0000 mg | ORAL_TABLET | Freq: Every day | ORAL | 0 refills | Status: DC

## 2020-04-30 NOTE — Telephone Encounter (Signed)
Copied from [place] [id number]. Topic: Quick Communication - Rx Refill/Question >> Apr 30, 2020 12:24 PM Leward Quan A wrote: Medication: rosuvastatin (CRESTOR) 40 MG tablet  Has the patient contacted their pharmacy? Yes.   (Agent: If no, request that the patient contact the pharmacy for the refill.) (Agent: If yes, when and what did the pharmacy advise?)  Preferred Pharmacy (with phone number or street name): [id number] [place], [place] - [address]  Phone:  [phone] Fax:  [phone]     Agent: Please be advised that RX refills may take up to 3 business days. We ask that you follow-up with your pharmacy.

## 2020-05-03 ENCOUNTER — Encounter: Payer: Self-pay | Admitting: Registered Nurse

## 2020-05-03 NOTE — Progress Notes (Signed)
Established Patient Office Visit  Subjective:  Patient ID: Jesse Bell, male    DOB: 05/28/1960  Age: 60 y.o. MRN: [id number]  CC:  Chief Complaint  Patient presents with  . Medication Refill    Patient states he is here for an medication refill. per patient he has no other question or concerns.    HPI Jesse Bell presents for medication refill  Hypertension: Patient Currently taking: metoprolol 50mg  XR 24h PO qd, olmesartan 20mg  PO qd Good effect. No AEs. Denies CV symptoms including: chest pain, shob, doe, headache, visual changes, fatigue, claudication, and dependent edema.   Previous readings and labs: BP Readings from Last 3 Encounters:  03/18/20 112/68  02/24/20 112/68  10/16/19 114/73   Lab Results  Component Value Date   CREATININE 0.88 05/14/2019   Also needs refill on trazodone  Otherwise no concerns  Past Medical History:  Diagnosis Date  . Back pain   . Hypertension     Past Surgical History:  Procedure Laterality Date  . FRACTURE SURGERY    . JOINT REPLACEMENT      Family History  Problem Relation Age of Onset  . Pulmonary fibrosis Father   . Pulmonary fibrosis Brother     Social History   Socioeconomic History  . Marital status: Divorced    Spouse name: Not on file  . Number of children: 1  . Years of education: Not on file  . Highest education level: Not on file  Occupational History  . Not on file  Tobacco Use  . Smoking status: Never Smoker  . Smokeless tobacco: Never Used  Vaping Use  . Vaping Use: Never used  Substance and Sexual Activity  . Alcohol use: Yes    Alcohol/week: 1.0 - 2.0 standard drink    Types: 1 - 2 Cans of beer per week  . Drug use: No  . Sexual activity: Not Currently  Other Topics Concern  . Not on file  Social History Narrative  . Not on file   Social Determinants of Health   Financial Resource Strain: Not on file  Food Insecurity: Not on file  Transportation Needs: Not on file  Physical  Activity: Not on file  Stress: Not on file  Social Connections: Not on file  Intimate Partner Violence: Not on file    Outpatient Medications Prior to Visit  Medication Sig Dispense Refill  . albuterol (VENTOLIN HFA) 108 (90 Base) MCG/ACT inhaler Inhale 2 puffs into the lungs every 6 (six) hours as needed for wheezing or shortness of breath (cough, shortness of breath or wheezing.). 18 g 2  . chlorpheniramine (CHLOR-TRIMETON) 4 MG tablet Take 8 mg by mouth 3 (three) times daily.    Marland Kitchen DEXAMETHASONE PO Take 0.5 tablets by mouth daily as needed (for back and hip pain).     Marland Kitchen etodolac (LODINE) 400 MG tablet TAKE 1 TABLET(400 MG) BY MOUTH DAILY 90 tablet 1  . ipratropium (ATROVENT) 0.03 % nasal spray Place 2 sprays into both nostrils 2 (two) times daily as needed for rhinitis. 30 mL 5  . montelukast (SINGULAIR) 10 MG tablet Take 1 tablet (10 mg total) by mouth at bedtime. 90 tablet 3  . olmesartan (BENICAR) 20 MG tablet Take 1 tablet (20 mg total) by mouth daily. 90 tablet 1  . metoprolol succinate (TOPROL-XL) 50 MG 24 hr tablet Take 1 tablet (50 mg total) by mouth daily. Take with or immediately following a meal. 90 tablet 1  . rosuvastatin (CRESTOR)  40 MG tablet Take 1 tablet (40 mg total) by mouth daily. 90 tablet 3  . traZODone (DESYREL) 50 MG tablet Take 0.5-3 tablets (25-150 mg total) by mouth at bedtime as needed for sleep. 60 tablet 1  . amoxicillin (AMOXIL) 500 MG capsule Take 2 capsules (1,000 mg total) by mouth 3 (three) times daily. 30 capsule 0  . predniSONE (DELTASONE) 20 MG tablet Take 2 tablets (40 mg total) by mouth daily with breakfast. 10 tablet 0   No facility-administered medications prior to visit.    Allergies  Allergen Reactions  . Lisinopril Cough    COUGH    ROS Review of Systems  Constitutional: Negative.   HENT: Negative.   Eyes: Negative.   Respiratory: Negative.   Cardiovascular: Negative.   Gastrointestinal: Negative.   Genitourinary: Negative.    Musculoskeletal: Negative.   Skin: Negative.   Neurological: Negative.   Psychiatric/Behavioral: Negative.   All other systems reviewed and are negative.     Objective:    Physical Exam Constitutional:      General: He is not in acute distress.    Appearance: Normal appearance. He is normal weight. He is not ill-appearing, toxic-appearing or diaphoretic.  Cardiovascular:     Rate and Rhythm: Normal rate and regular rhythm.     Heart sounds: Normal heart sounds. No murmur heard. No friction rub. No gallop.   Pulmonary:     Effort: Pulmonary effort is normal. No respiratory distress.     Breath sounds: Normal breath sounds. No stridor. No wheezing, rhonchi or rales.  Chest:     Chest wall: No tenderness.  Neurological:     General: No focal deficit present.     Mental Status: He is alert and oriented to person, place, and time. Mental status is at baseline.  Psychiatric:        Mood and Affect: Mood normal.        Behavior: Behavior normal.        Thought Content: Thought content normal.        Judgment: Judgment normal.     BP 112/68   Pulse 85   Temp 98 F (36.7 C) (Temporal)   Resp 18   Ht 5\' 9"  (1.753 m)   Wt 209 lb (94.8 kg)   SpO2 98%   BMI 30.86 kg/m  Wt Readings from Last 3 Encounters:  03/18/20 209 lb (94.8 kg)  02/24/20 209 lb (94.8 kg)  12/23/19 190 lb (86.2 kg)     There are no preventive care reminders to display for this patient.  There are no preventive care reminders to display for this patient.  Lab Results  Component Value Date   TSH 2.040 05/16/2018   Lab Results  Component Value Date   WBC 8.2 05/14/2019   HGB 13.7 05/14/2019   HCT 43.3 05/14/2019   MCV 87 05/14/2019   PLT 407 05/14/2019   Lab Results  Component Value Date   NA 139 05/14/2019   K 5.1 05/14/2019   CO2 22 05/14/2019   GLUCOSE 91 05/14/2019   BUN 14 05/14/2019   CREATININE 0.88 05/14/2019   BILITOT 0.4 05/14/2019   ALKPHOS 61 05/14/2019   AST 36 05/14/2019    ALT 25 05/14/2019   PROT 7.2 05/14/2019   ALBUMIN 4.7 05/14/2019   CALCIUM 9.5 05/14/2019   Lab Results  Component Value Date   CHOL 237 (H) 05/14/2019   Lab Results  Component Value Date   HDL 47 05/14/2019  Lab Results  Component Value Date   LDLCALC 142 (H) 05/14/2019   Lab Results  Component Value Date   TRIG 263 (H) 05/14/2019   Lab Results  Component Value Date   CHOLHDL 5.0 05/14/2019   No results found for: HGBA1C    Assessment & Plan:   Problem List Items Addressed This Visit   None   Visit Diagnoses    Sleep disturbance       Relevant Medications   traZODone (DESYREL) 50 MG tablet    Essential hypertension       Relevant Medications   metoprolol succinate (TOPROL-XL) 50 MG 24 hr tablet      Meds ordered this encounter  Medications  . traZODone (DESYREL) 50 MG tablet    Sig: Take 0.5-3 tablets (25-150 mg total) by mouth at bedtime as needed for sleep.    Dispense:  90 tablet    Refill:  1  . metoprolol succinate (TOPROL-XL) 50 MG 24 hr tablet    Sig: Take 1 tablet (50 mg total) by mouth daily. Take with or immediately following a meal.    Dispense:  90 tablet    Refill:  1    Follow-up: No follow-ups on file.   PLAN  Refill trazodone  Refill metorpolol  Can refill others as need arises  Encourage CPE and labs in early 2022  Patient encouraged to call clinic with any questions, comments, or concerns.  Maximiano Coss, NP

## 2020-05-19 ENCOUNTER — Other Ambulatory Visit: Payer: Self-pay | Admitting: Registered Nurse

## 2020-05-19 DIAGNOSIS — I10 Essential (primary) hypertension: Secondary | ICD-10-CM

## 2020-05-19 MED ORDER — OLMESARTAN MEDOXOMIL 20 MG PO TABS: 20.0000 mg | ORAL_TABLET | Freq: Every day | ORAL | 1 refills | Status: DC

## 2020-05-19 NOTE — Telephone Encounter (Signed)
Medication Refill - Medication: olmesartan (BENICAR) 20 MG tablet   Has the patient contacted their pharmacy? Yes. (Agent: If no, request that the patient contact the pharmacy for the refill.) (Agent: If yes, when and what did the pharmacy advise?)  Preferred Pharmacy (with phone number or street name):  [facility] [place], [place] - [address]  [place] [address]  Phone: [phone] Fax: [phone]     Agent: Please be advised that RX refills may take up to 3 business days. We ask that you follow-up with your pharmacy.

## 2020-05-31 ENCOUNTER — Other Ambulatory Visit: Payer: Self-pay | Admitting: Registered Nurse

## 2020-05-31 DIAGNOSIS — J310 Chronic rhinitis: Secondary | ICD-10-CM

## 2020-05-31 MED ORDER — MONTELUKAST SODIUM 10 MG PO TABS: 10.0000 mg | ORAL_TABLET | Freq: Every day | ORAL | 0 refills | Status: DC

## 2020-05-31 NOTE — Telephone Encounter (Signed)
Medication: montelukast (SINGULAIR) 10 MG tablet  Has the pt contacted their pharmacy? No Rx has expired Pt has appt on 06/23/20.  Can you send in 30 days to get him through to appt?  Preferred pharmacy: [id number] - [place], [place]  Please be advised refills may take up to  business days.  We ask that you follow up with your pharmacy.

## 2020-06-23 ENCOUNTER — Encounter: Payer: PPO | Admitting: Registered Nurse

## 2020-07-21 ENCOUNTER — Other Ambulatory Visit: Payer: Self-pay | Admitting: Emergency Medicine

## 2020-07-21 DIAGNOSIS — J310 Chronic rhinitis: Secondary | ICD-10-CM

## 2020-07-27 ENCOUNTER — Other Ambulatory Visit: Payer: Self-pay | Admitting: Registered Nurse

## 2020-07-27 DIAGNOSIS — E782 Mixed hyperlipidemia: Secondary | ICD-10-CM

## 2020-08-05 ENCOUNTER — Telehealth: Payer: Self-pay

## 2020-08-05 ENCOUNTER — Other Ambulatory Visit: Payer: Self-pay

## 2020-08-05 ENCOUNTER — Encounter: Payer: Self-pay | Admitting: Internal Medicine

## 2020-08-05 ENCOUNTER — Ambulatory Visit (INDEPENDENT_AMBULATORY_CARE_PROVIDER_SITE_OTHER): Payer: PPO | Admitting: Internal Medicine

## 2020-08-05 VITALS — BP 134/84 | HR 78 | Temp 98.5°F | Resp 16 | Ht 69.0 in | Wt 216.0 lb

## 2020-08-05 DIAGNOSIS — E781 Pure hyperglyceridemia: Secondary | ICD-10-CM | POA: Diagnosis not present

## 2020-08-05 DIAGNOSIS — E785 Hyperlipidemia, unspecified: Secondary | ICD-10-CM | POA: Diagnosis not present

## 2020-08-05 DIAGNOSIS — Z1211 Encounter for screening for malignant neoplasm of colon: Secondary | ICD-10-CM | POA: Insufficient documentation

## 2020-08-05 DIAGNOSIS — Z125 Encounter for screening for malignant neoplasm of prostate: Secondary | ICD-10-CM | POA: Insufficient documentation

## 2020-08-05 DIAGNOSIS — E039 Hypothyroidism, unspecified: Secondary | ICD-10-CM

## 2020-08-05 DIAGNOSIS — J4520 Mild intermittent asthma, uncomplicated: Secondary | ICD-10-CM | POA: Diagnosis not present

## 2020-08-05 DIAGNOSIS — I10 Essential (primary) hypertension: Secondary | ICD-10-CM | POA: Diagnosis not present

## 2020-08-05 DIAGNOSIS — R9431 Abnormal electrocardiogram [ECG] [EKG]: Secondary | ICD-10-CM | POA: Diagnosis not present

## 2020-08-05 DIAGNOSIS — Z0001 Encounter for general adult medical examination with abnormal findings: Secondary | ICD-10-CM | POA: Insufficient documentation

## 2020-08-05 DIAGNOSIS — Z23 Encounter for immunization: Secondary | ICD-10-CM | POA: Insufficient documentation

## 2020-08-05 LAB — PSA: PSA: 0.1 ng/mL (ref 0.10–4.00)

## 2020-08-05 NOTE — Telephone Encounter (Signed)
Pt stated that he would come at the beginning of next week to have the rest of the lab work done.

## 2020-08-05 NOTE — Telephone Encounter (Signed)
-----   Message from Janith Lima, MD sent at 08/05/2020  1:33 PM EDT ----- Regarding: FW: Labs Additional labs need to be done  TJ  ----- Message ----- From: Jari Pigg, CMA Sent: 08/05/2020   1:31 PM EDT To: Janith Lima, MD Subject: RE: Labs                                       Pt stated that he did have blood work done prior to leaving.   ----- Message ----- From: Janith Lima, MD Sent: 08/05/2020  11:41 AM EDT To: Jari Pigg, CMA Subject: Labs                                           Can you ask him to go back to the lab?

## 2020-08-05 NOTE — Patient Instructions (Signed)

## 2020-08-05 NOTE — Progress Notes (Signed)
Subjective:  Patient ID: Jesse Bell, male    DOB: 1960/08/05  Age: 60 y.o. MRN: [id number]  CC: New Patient (Initial Visit) (physical), Annual Exam, Hypertension, and Hyperlipidemia  This visit occurred during the SARS-CoV-2 public health emergency.  Safety protocols were in place, including screening questions prior to the visit, additional usage of staff PPE, and extensive cleaning of exam room while observing appropriate contact time as indicated for disinfecting solutions.    HPI TERREN JANDREAU presents for a CPX, establish, and f/up-  He is not very active due to chronic musculoskeletal pain.  He denies any recent episodes of chest pain, shortness of breath, palpitations, edema, or fatigue.  He does complain of weight gain.  History Winfrey has a past medical history of Allergy, Back pain, Hyperlipidemia, and Hypertension.   He has a past surgical history that includes Fracture surgery and Knee surgery.   His family history includes Heart Problems in his brother; Macular degeneration in his brother; Pulmonary fibrosis in his brother and father; Skin cancer in his brother.He reports that he has never smoked. He has never used smokeless tobacco. He reports current alcohol use of about 1.0 - 2.0 standard drink of alcohol per week. He reports that he does not use drugs.  Outpatient Medications Prior to Visit  Medication Sig Dispense Refill  . albuterol (VENTOLIN HFA) 108 (90 Base) MCG/ACT inhaler Inhale 2 puffs into the lungs every 6 (six) hours as needed for wheezing or shortness of breath (cough, shortness of breath or wheezing.). 18 g 2  . ipratropium (ATROVENT) 0.03 % nasal spray Place 2 sprays into both nostrils 2 (two) times daily as needed for rhinitis. 30 mL 5  . metoprolol succinate (TOPROL-XL) 50 MG 24 hr tablet Take 1 tablet (50 mg total) by mouth daily. Take with or immediately following a meal. 90 tablet 1  . montelukast (SINGULAIR) 10 MG tablet TAKE 1 TABLET(10 MG) BY MOUTH AT  BEDTIME 30 tablet 0  . olmesartan (BENICAR) 20 MG tablet Take 1 tablet (20 mg total) by mouth daily. 90 tablet 1  . rosuvastatin (CRESTOR) 40 MG tablet TAKE 1 TABLET(40 MG) BY MOUTH DAILY 90 tablet 0  . traZODone (DESYREL) 50 MG tablet Take 0.5-3 tablets (25-150 mg total) by mouth at bedtime as needed for sleep. 90 tablet 1  . chlorpheniramine (CHLOR-TRIMETON) 4 MG tablet Take 8 mg by mouth 3 (three) times daily.    Marland Kitchen DEXAMETHASONE PO Take 0.5 tablets by mouth daily as needed (for back and hip pain).     Marland Kitchen etodolac (LODINE) 400 MG tablet TAKE 1 TABLET(400 MG) BY MOUTH DAILY (Patient not taking: Reported on 08/05/2020) 90 tablet 1   No facility-administered medications prior to visit.    ROS Review of Systems  Constitutional: Positive for unexpected weight change. Negative for appetite change, chills, diaphoresis, fatigue and fever.  HENT: Negative.   Eyes: Negative.   Respiratory: Negative for cough, chest tightness, shortness of breath and wheezing.   Cardiovascular: Negative for chest pain, palpitations and leg swelling.  Gastrointestinal: Negative for abdominal pain, constipation, diarrhea, nausea and vomiting.  Endocrine: Negative.   Genitourinary: Negative.  Negative for difficulty urinating, dysuria, hematuria, scrotal swelling, testicular pain and urgency.  Musculoskeletal: Positive for arthralgias and back pain. Negative for myalgias.  Skin: Negative.   Neurological: Negative.  Negative for dizziness, weakness, light-headedness, numbness and headaches.  Hematological: Negative for adenopathy. Does not bruise/bleed easily.  Psychiatric/Behavioral: Negative.     Objective:  BP 134/84 (  BP Location: Right Arm, Patient Position: Sitting, Cuff Size: Large)   Pulse 78   Temp 98.5 F (36.9 C) (Oral)   Resp 16   Ht 5\' 9"  (1.753 m)   Wt 216 lb (98 kg)   SpO2 96%   BMI 31.90 kg/m   Physical Exam Vitals reviewed.  Constitutional:      Appearance: Normal appearance.  HENT:      Nose: Nose normal.     Mouth/Throat:     Mouth: Mucous membranes are moist.  Eyes:     General: No scleral icterus.    Conjunctiva/sclera: Conjunctivae normal.  Cardiovascular:     Rate and Rhythm: Normal rate and regular rhythm.     Heart sounds: No murmur heard.     Comments: EKG- NSR, 80 bpm Incomplete RBBB TWI in III and V1 No LVH or Q waves No old EKG's for comparison. Pulmonary:     Effort: Pulmonary effort is normal.     Breath sounds: No stridor. No wheezing, rhonchi or rales.  Abdominal:     General: Abdomen is protuberant. Bowel sounds are normal. There is no distension.     Palpations: Abdomen is soft. There is no hepatomegaly, splenomegaly or mass.     Tenderness: There is no abdominal tenderness.     Hernia: No hernia is present.  Genitourinary:    Comments: He refused a GU/rectal exam Musculoskeletal:        General: Normal range of motion.     Cervical back: Neck supple.     Right lower leg: No edema.     Left lower leg: No edema.  Lymphadenopathy:     Cervical: No cervical adenopathy.  Skin:    General: Skin is warm and dry.  Neurological:     General: No focal deficit present.     Mental Status: He is alert.  Psychiatric:        Mood and Affect: Mood normal.        Behavior: Behavior normal.     Lab Results  Component Value Date   WBC 6.6 08/09/2020   HGB 13.1 08/09/2020   HCT 39.4 08/09/2020   PLT 320.0 08/09/2020   GLUCOSE 97 08/09/2020   CHOL 159 08/09/2020   TRIG 191.0 (H) 08/09/2020   HDL 48.70 08/09/2020   LDLCALC 72 08/09/2020   ALT 15 08/09/2020   AST 27 08/09/2020   NA 138 08/09/2020   K 4.3 08/09/2020   CL 103 08/09/2020   CREATININE 0.87 08/09/2020   BUN 14 08/09/2020   CO2 27 08/09/2020   TSH 4.62 (H) 08/09/2020   PSA 0.10 08/05/2020    Assessment & Plan:   Melvin was seen today for new patient (initial visit), annual exam, hypertension and hyperlipidemia.  Diagnoses and all orders for this visit:  Primary  hypertension- His blood pressure is adequately well controlled. -     EKG 12-Lead -     Basic metabolic panel; Future  Encounter for general adult medical examination with abnormal findings- Exam completed, labs reviewed, vaccines reviewed and updated, cancer screenings addressed, patient education was given.  Hyperlipidemia with target LDL less than 130- He has achieved his LDL goal and is doing well on the statin. -     Lipid panel; Future -     TSH; Future -     Hepatic function panel; Future  Primary hypertriglyceridemia- His triglycerides are not high enough to be treated medically.  Colon cancer screening -  Cologuard -     Lipid panel; Future  Mild intermittent asthma without complication- His asthma is adequately well controlled. -     Pneumococcal conjugate vaccine 20-valent (Prevnar 20) -     CBC with Differential/Platelet; Future  Need for Tdap vaccination -     Tdap vaccine greater than or equal to 7yo IM  Screening for prostate cancer -     PSA; Future -     PSA  Abnormal electrocardiogram (ECG) (EKG)- I have asked him to see cardiology about this. -     Ambulatory referral to Cardiology  Acquired hypothyroidism- I think this explains his weight gain.  I recommended that he start taking levothyroxine. -     levothyroxine (SYNTHROID) 25 MCG tablet; Take 1 tablet (25 mcg total) by mouth daily.   I have discontinued Marsean A. Kring's DEXAMETHASONE PO and chlorpheniramine. I am also having him start on levothyroxine. Additionally, I am having him maintain his etodolac, albuterol, ipratropium, traZODone, metoprolol succinate, olmesartan, montelukast, and rosuvastatin.  Meds ordered this encounter  Medications  . levothyroxine (SYNTHROID) 25 MCG tablet    Sig: Take 1 tablet (25 mcg total) by mouth daily.    Dispense:  90 tablet    Refill:  0     Follow-up: Return in about 6 months (around 02/04/2021).  Scarlette Calico, MD

## 2020-08-09 ENCOUNTER — Other Ambulatory Visit (INDEPENDENT_AMBULATORY_CARE_PROVIDER_SITE_OTHER): Payer: PPO

## 2020-08-09 ENCOUNTER — Encounter: Payer: Self-pay | Admitting: Internal Medicine

## 2020-08-09 DIAGNOSIS — E785 Hyperlipidemia, unspecified: Secondary | ICD-10-CM

## 2020-08-09 DIAGNOSIS — I10 Essential (primary) hypertension: Secondary | ICD-10-CM

## 2020-08-09 DIAGNOSIS — J4520 Mild intermittent asthma, uncomplicated: Secondary | ICD-10-CM

## 2020-08-09 DIAGNOSIS — Z1211 Encounter for screening for malignant neoplasm of colon: Secondary | ICD-10-CM | POA: Diagnosis not present

## 2020-08-09 DIAGNOSIS — E039 Hypothyroidism, unspecified: Secondary | ICD-10-CM | POA: Insufficient documentation

## 2020-08-09 LAB — TSH: TSH: 4.62 u[IU]/mL — ABNORMAL HIGH (ref 0.35–4.50)

## 2020-08-09 LAB — BASIC METABOLIC PANEL
BUN: 14 mg/dL (ref 6–23)
CO2: 27 mEq/L (ref 19–32)
Calcium: 8.8 mg/dL (ref 8.4–10.5)
Chloride: 103 mEq/L (ref 96–112)
Creatinine, Ser: 0.87 mg/dL (ref 0.40–1.50)
GFR: 93.87 mL/min (ref >60.00)
Glucose, Bld: 97 mg/dL (ref 70–99)
Potassium: 4.3 mEq/L (ref 3.5–5.1)
Sodium: 138 mEq/L (ref 135–145)

## 2020-08-09 LAB — LIPID PANEL
Cholesterol: 159 mg/dL (ref 0–200)
HDL: 48.7 mg/dL (ref >39.00)
LDL Cholesterol: 72 mg/dL (ref 0–99)
NonHDL: 110.67
Total CHOL/HDL Ratio: 3
Triglycerides: 191 mg/dL — ABNORMAL HIGH (ref 0.0–149.0)
VLDL: 38.2 mg/dL (ref 0.0–40.0)

## 2020-08-09 LAB — CBC WITH DIFFERENTIAL/PLATELET
Basophils Absolute: 0.1 10*3/uL (ref 0.0–0.1)
Basophils Relative: 1.2 % (ref 0.0–3.0)
Eosinophils Absolute: 0.5 10*3/uL (ref 0.0–0.7)
Eosinophils Relative: 7 % — ABNORMAL HIGH (ref 0.0–5.0)
HCT: 39.4 % (ref 39.0–52.0)
Hemoglobin: 13.1 g/dL (ref 13.0–17.0)
Lymphocytes Relative: 23.8 % (ref 12.0–46.0)
Lymphs Abs: 1.6 10*3/uL (ref 0.7–4.0)
MCHC: 33.2 g/dL (ref 30.0–36.0)
MCV: 87.3 fl (ref 78.0–100.0)
Monocytes Absolute: 0.7 10*3/uL (ref 0.1–1.0)
Monocytes Relative: 11.1 % (ref 3.0–12.0)
Neutro Abs: 3.7 10*3/uL (ref 1.4–7.7)
Neutrophils Relative %: 56.9 % (ref 43.0–77.0)
Platelets: 320 10*3/uL (ref 150.0–400.0)
RBC: 4.51 Mil/uL (ref 4.22–5.81)
RDW: 13.1 % (ref 11.5–15.5)
WBC: 6.6 10*3/uL (ref 4.0–10.5)

## 2020-08-09 LAB — HEPATIC FUNCTION PANEL
ALT: 15 U/L (ref 0–53)
AST: 27 U/L (ref 0–37)
Albumin: 4.3 g/dL (ref 3.5–5.2)
Alkaline Phosphatase: 42 U/L (ref 39–117)
Bilirubin, Direct: 0.1 mg/dL (ref 0.0–0.3)
Total Bilirubin: 0.4 mg/dL (ref 0.2–1.2)
Total Protein: 7.1 g/dL (ref 6.0–8.3)

## 2020-08-09 MED ORDER — LEVOTHYROXINE SODIUM 25 MCG PO TABS: 25.0000 ug | ORAL_TABLET | Freq: Every day | ORAL | 0 refills | Status: DC

## 2020-08-12 ENCOUNTER — Other Ambulatory Visit: Payer: Self-pay | Admitting: Registered Nurse

## 2020-08-12 ENCOUNTER — Other Ambulatory Visit: Payer: Self-pay | Admitting: Emergency Medicine

## 2020-08-12 DIAGNOSIS — I10 Essential (primary) hypertension: Secondary | ICD-10-CM

## 2020-08-12 DIAGNOSIS — J310 Chronic rhinitis: Secondary | ICD-10-CM
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
## 2020-09-03 ENCOUNTER — Encounter: Payer: Self-pay | Admitting: Cardiology

## 2020-09-03 ENCOUNTER — Other Ambulatory Visit: Payer: Self-pay

## 2020-09-03 ENCOUNTER — Ambulatory Visit: Payer: PPO | Admitting: Cardiology

## 2020-09-03 VITALS — BP 122/77 | HR 62 | Temp 98.1°F | Resp 16 | Ht 69.0 in | Wt 211.0 lb

## 2020-09-03 DIAGNOSIS — I4510 Unspecified right bundle-branch block: Secondary | ICD-10-CM

## 2020-09-03 DIAGNOSIS — R9431 Abnormal electrocardiogram [ECG] [EKG]: Secondary | ICD-10-CM | POA: Diagnosis not present

## 2020-09-03 DIAGNOSIS — E781 Pure hyperglyceridemia: Secondary | ICD-10-CM | POA: Diagnosis not present

## 2020-09-03 DIAGNOSIS — E782 Mixed hyperlipidemia: Secondary | ICD-10-CM

## 2020-09-03 DIAGNOSIS — I10 Essential (primary) hypertension: Secondary | ICD-10-CM

## 2020-09-13 DIAGNOSIS — Z1211 Encounter for screening for malignant neoplasm of colon: Secondary | ICD-10-CM | POA: Diagnosis not present

## 2020-09-13 LAB — COLOGUARD
Cologuard: NEGATIVE
Cologuard: NEGATIVE

## 2020-09-14 ENCOUNTER — Ambulatory Visit: Payer: PPO

## 2020-09-14 ENCOUNTER — Other Ambulatory Visit: Payer: Self-pay

## 2020-09-14 ENCOUNTER — Telehealth: Payer: Self-pay | Admitting: Internal Medicine

## 2020-09-14 ENCOUNTER — Other Ambulatory Visit: Payer: Self-pay | Admitting: Internal Medicine

## 2020-09-14 DIAGNOSIS — I10 Essential (primary) hypertension: Secondary | ICD-10-CM

## 2020-09-14 DIAGNOSIS — R9431 Abnormal electrocardiogram [ECG] [EKG]: Secondary | ICD-10-CM | POA: Diagnosis not present

## 2020-09-14 MED ORDER — METOPROLOL SUCCINATE ER 50 MG PO TB24: 50.0000 mg | ORAL_TABLET | Freq: Every day | ORAL | 1 refills | Status: DC

## 2020-09-14 NOTE — Telephone Encounter (Signed)
1.Medication Requested: metoprolol succinate (TOPROL-XL) 50 MG 24 hr tablet 2. Pharmacy (Name, [place], [facility]): [facility] [phone] - [place], [place] - [place] Phone:  [phone]  Fax:  [phone]      3. On Med List: Y   4. Last Visit with PCP: 08/05/2020  5. Next visit date with PCP: N/A   Agent: Please be advised that RX refills may take up to 3 business days. We ask that you follow-up with your pharmacy.

## 2020-09-18 LAB — COLOGUARD: COLOGUARD: NEGATIVE

## 2020-09-22 ENCOUNTER — Encounter: Payer: Self-pay | Admitting: Internal Medicine

## 2020-10-15 ENCOUNTER — Ambulatory Visit: Payer: PPO | Admitting: Cardiology

## 2020-10-22 ENCOUNTER — Telehealth: Payer: Self-pay | Admitting: Registered Nurse

## 2020-10-22 DIAGNOSIS — E782 Mixed hyperlipidemia: Secondary | ICD-10-CM

## 2020-10-26 ENCOUNTER — Other Ambulatory Visit: Payer: Self-pay | Admitting: Internal Medicine

## 2020-10-26 DIAGNOSIS — E782 Mixed hyperlipidemia: Secondary | ICD-10-CM

## 2020-10-26 MED ORDER — ROSUVASTATIN CALCIUM 40 MG PO TABS: ORAL_TABLET | ORAL | 1 refills | Status: DC

## 2020-10-26 NOTE — Telephone Encounter (Signed)
Patient requesting refill for rosuvastatin (CRESTOR) 40 MG tablet

## 2020-11-03 ENCOUNTER — Other Ambulatory Visit: Payer: Self-pay | Admitting: Internal Medicine

## 2020-11-03 DIAGNOSIS — E039 Hypothyroidism, unspecified: Secondary | ICD-10-CM

## 2020-11-10 ENCOUNTER — Other Ambulatory Visit: Payer: Self-pay | Admitting: Emergency Medicine

## 2020-11-10 DIAGNOSIS — I10 Essential (primary) hypertension: Secondary | ICD-10-CM

## 2020-11-18 ENCOUNTER — Other Ambulatory Visit: Payer: Self-pay | Admitting: Internal Medicine

## 2020-11-18 ENCOUNTER — Telehealth: Payer: Self-pay | Admitting: Internal Medicine

## 2020-11-18 DIAGNOSIS — G8929 Other chronic pain: Secondary | ICD-10-CM

## 2020-11-18 DIAGNOSIS — M1990 Unspecified osteoarthritis, unspecified site: Secondary | ICD-10-CM

## 2020-11-18 DIAGNOSIS — I10 Essential (primary) hypertension: Secondary | ICD-10-CM

## 2020-11-18 MED ORDER — OLMESARTAN MEDOXOMIL 20 MG PO TABS: 20.0000 mg | ORAL_TABLET | Freq: Every day | ORAL | 0 refills | Status: DC

## 2020-11-18 MED ORDER — ETODOLAC 400 MG PO TABS: 400.0000 mg | ORAL_TABLET | Freq: Every day | ORAL | 0 refills | Status: DC

## 2020-11-18 NOTE — Telephone Encounter (Signed)
1.Medication Requested: etodolac (LODINE) 400 MG tablet olmesartan (BENICAR) 20 MG tablet   2. Pharmacy (Name, [place]): [facility] [phone] - [place], [place] - [address]  Phone:  [phone] Fax:  [phone]   3. On Med List: yes  4. Last Visit with PCP: 04.28.22  5. Next visit date with PCP: n/a   Agent: Please be advised that RX refills may take up to 3 business days. We ask that you follow-up with your pharmacy.

## 2021-02-01 ENCOUNTER — Telehealth: Payer: Self-pay | Admitting: Internal Medicine

## 2021-02-01 ENCOUNTER — Other Ambulatory Visit: Payer: Self-pay

## 2021-02-01 DIAGNOSIS — E039 Hypothyroidism, unspecified: Secondary | ICD-10-CM

## 2021-02-01 MED ORDER — LEVOTHYROXINE SODIUM 25 MCG PO TABS: ORAL_TABLET | ORAL | 0 refills | Status: DC

## 2021-02-01 NOTE — Telephone Encounter (Signed)
1.Medication Requested:levothyroxine (SYNTHROID) 25 MCG tablet  2. Pharmacy (Name, [place], [facility]):  [facility] [phone] - [place], [place] - [place] Phone:  [phone]  Fax:  [phone]      3. On Med List: Yes  4. Last Visit with PCP: 4.8.2022  5. Next visit date with PCP:n/a   Agent: Please be advised that RX refills may take up to 3 business days. We ask that you follow-up with your pharmacy.

## 2021-02-08 ENCOUNTER — Other Ambulatory Visit: Payer: Self-pay | Admitting: Internal Medicine

## 2021-02-08 DIAGNOSIS — I10 Essential (primary) hypertension: Secondary | ICD-10-CM

## 2021-02-08 NOTE — Telephone Encounter (Signed)
Called pt, LVM stating that Rx was sent on 10/25 for 90day

## 2021-02-08 NOTE — Telephone Encounter (Signed)
Patient calling to check status of refill request.  Please advise

## 2021-03-28 ENCOUNTER — Telehealth: Payer: PPO | Admitting: Physician Assistant

## 2021-03-28 DIAGNOSIS — B9689 Other specified bacterial agents as the cause of diseases classified elsewhere: Secondary | ICD-10-CM | POA: Diagnosis not present

## 2021-03-28 DIAGNOSIS — J019 Acute sinusitis, unspecified: Secondary | ICD-10-CM

## 2021-03-28 MED ORDER — AMOXICILLIN-POT CLAVULANATE 875-125 MG PO TABS: 1.0000 | ORAL_TABLET | Freq: Two times a day (BID) | ORAL | 0 refills | Status: DC

## 2021-03-28 MED ORDER — BENZONATATE 100 MG PO CAPS: 100.0000 mg | ORAL_CAPSULE | Freq: Three times a day (TID) | ORAL | 0 refills | Status: DC | PRN

## 2021-03-28 NOTE — Progress Notes (Signed)
Virtual Visit Consent   Jesse Bell, you are scheduled for a virtual visit with a [facility] provider today.     Just as with appointments in the office, your consent must be obtained to participate.  Your consent will be active for this visit and any virtual visit you may have with one of our providers in the next 365 days.     If you have a MyChart account, a copy of this consent can be sent to you electronically.  All virtual visits are billed to your insurance company just like a traditional visit in the office.    As this is a virtual visit, video technology does not allow for your provider to perform a traditional examination.  This may limit your provider's ability to fully assess your condition.  If your provider identifies any concerns that need to be evaluated in person or the need to arrange testing (such as labs, EKG, etc.), we will make arrangements to do so.     Although advances in technology are sophisticated, we cannot ensure that it will always work on either your end or our end.  If the connection with a video visit is poor, the visit may have to be switched to a telephone visit.  With either a video or telephone visit, we are not always able to ensure that we have a secure connection.     I need to obtain your verbal consent now.   Are you willing to proceed with your visit today?    Jesse Bell has provided verbal consent on 03/28/2021 for a virtual visit (video or telephone).   Mar Daring, PA-C   Date: 03/28/2021 1:08 PM   Virtual Visit via Video Note   I, Mar Daring, connected with  Jesse Bell  ([id number], December 10, 1960) on 03/28/21 at  1:30 PM EST by a video-enabled telemedicine application and verified that I am speaking with the correct person using two identifiers.  Location: Patient: Virtual Visit Location Patient: Home Provider: Virtual Visit Location Provider: Home Office   I discussed the limitations of evaluation and management by  telemedicine and the availability of in person appointments. The patient expressed understanding and agreed to proceed.    History of Present Illness: Jesse Bell is a 60 y.o. who identifies as a male who was assigned male at birth, and is being seen today for URI symptoms.  HPI: URI  This is a new problem. The current episode started in the past 7 days. The problem has been unchanged. There has been no fever. Associated symptoms include congestion, coughing, headaches, a plugged ear sensation, rhinorrhea and sinus pain. Pertinent negatives include no diarrhea, ear pain, nausea, sore throat or vomiting. Associated symptoms comments: Chills, body aches, post nasal drainage. Treatments tried: alka seltzer plus, delsym, cough drops. The treatment provided no relief.     Problems:  Patient Active Problem List   Diagnosis Date Noted   Acquired hypothyroidism 08/09/2020   Encounter for general adult medical examination with abnormal findings 08/05/2020   Hyperlipidemia with target LDL less than 130 08/05/2020   Primary hypertriglyceridemia 08/05/2020   Colon cancer screening 08/05/2020   Mild intermittent asthma without complication 62/83/1517   Screening for prostate cancer 08/05/2020   Abnormal electrocardiogram (ECG) (EKG) 08/05/2020   Reactive airway disease 10/09/2016   Hypertension 05/03/2012   Back pain 05/03/2012    Allergies:  Allergies  Allergen Reactions   Lisinopril Cough    COUGH   Medications:  Current Outpatient Medications:    amoxicillin-clavulanate (AUGMENTIN) 875-125 MG tablet, Take 1 tablet by mouth 2 (two) times daily., Disp: 14 tablet, Rfl: 0   benzonatate (TESSALON) 100 MG capsule, Take 1 capsule (100 mg total) by mouth 3 (three) times daily as needed., Disp: 30 capsule, Rfl: 0   albuterol (VENTOLIN HFA) 108 (90 Base) MCG/ACT inhaler, Inhale 2 puffs into the lungs every 6 (six) hours as needed for wheezing or shortness of breath (cough, shortness of breath or  wheezing.)., Disp: 18 g, Rfl: 2   etodolac (LODINE) 400 MG tablet, Take 1 tablet (400 mg total) by mouth daily., Disp: 90 tablet, Rfl: 0   ipratropium (ATROVENT) 0.03 % nasal spray, Place 2 sprays into both nostrils 2 (two) times daily as needed for rhinitis., Disp: 30 mL, Rfl: 5   levothyroxine (SYNTHROID) 25 MCG tablet, TAKE 1 TABLET(25 MCG) BY MOUTH DAILY, Disp: 90 tablet, Rfl: 0   metoprolol succinate (TOPROL-XL) 50 MG 24 hr tablet, TAKE 1 TABLET(50 MG) BY MOUTH DAILY WITH OR IMMEDIATELY FOLLOWING A MEAL, Disp: 90 tablet, Rfl: 1   montelukast (SINGULAIR) 10 MG tablet, TAKE 1 TABLET(10 MG) BY MOUTH AT BEDTIME, Disp: 30 tablet, Rfl: 0   olmesartan (BENICAR) 20 MG tablet, TAKE 1 TABLET(20 MG) BY MOUTH DAILY, Disp: 90 tablet, Rfl: 0   rosuvastatin (CRESTOR) 40 MG tablet, TAKE 1 TABLET(40 MG) BY MOUTH DAILY, Disp: 90 tablet, Rfl: 1   traZODone (DESYREL) 50 MG tablet, Take 0.5-3 tablets (25-150 mg total) by mouth at bedtime as needed for sleep., Disp: 90 tablet, Rfl: 1  Observations/Objective: Patient is well-developed, well-nourished in no acute distress.  Resting comfortably at home.  Head is normocephalic, atraumatic.  No labored breathing.  Speech is clear and coherent with logical content.  Patient is alert and oriented at baseline.    Assessment and Plan: 1. Acute bacterial sinusitis - amoxicillin-clavulanate (AUGMENTIN) 875-125 MG tablet; Take 1 tablet by mouth 2 (two) times daily.  Dispense: 14 tablet; Refill: 0 - benzonatate (TESSALON) 100 MG capsule; Take 1 capsule (100 mg total) by mouth 3 (three) times daily as needed.  Dispense: 30 capsule; Refill: 0  - Worsening symptoms that have not responded to OTC medications.  - Will give augmentin and tessalon - Continue allergy medications.  - Stay well hydrated and get plenty of rest.  - Call PCP if no symptom improvement or if symptoms worsen.   Follow Up Instructions: I discussed the assessment and treatment plan with the  patient. The patient was provided an opportunity to ask questions and all were answered. The patient agreed with the plan and demonstrated an understanding of the instructions.  A copy of instructions were sent to the patient via MyChart unless otherwise noted below.    The patient was advised to call back or seek an in-person evaluation if the symptoms worsen or if the condition fails to improve as anticipated.  Time:  I spent 10 minutes with the patient via telehealth technology discussing the above problems/concerns.    Mar Daring, PA-C

## 2021-03-28 NOTE — Patient Instructions (Signed)
Jesse Bell, thank you for joining Mar Daring, PA-C for today's virtual visit.  While this provider is not your primary care provider (PCP), if your PCP is located in our provider database this encounter information will be shared with them immediately following your visit.  Consent: (Patient) Jesse Bell provided verbal consent for this virtual visit at the beginning of the encounter.  Current Medications:  Current Outpatient Medications:    amoxicillin-clavulanate (AUGMENTIN) 875-125 MG tablet, Take 1 tablet by mouth 2 (two) times daily., Disp: 14 tablet, Rfl: 0   benzonatate (TESSALON) 100 MG capsule, Take 1 capsule (100 mg total) by mouth 3 (three) times daily as needed., Disp: 30 capsule, Rfl: 0   albuterol (VENTOLIN HFA) 108 (90 Base) MCG/ACT inhaler, Inhale 2 puffs into the lungs every 6 (six) hours as needed for wheezing or shortness of breath (cough, shortness of breath or wheezing.)., Disp: 18 g, Rfl: 2   etodolac (LODINE) 400 MG tablet, Take 1 tablet (400 mg total) by mouth daily., Disp: 90 tablet, Rfl: 0   ipratropium (ATROVENT) 0.03 % nasal spray, Place 2 sprays into both nostrils 2 (two) times daily as needed for rhinitis., Disp: 30 mL, Rfl: 5   levothyroxine (SYNTHROID) 25 MCG tablet, TAKE 1 TABLET(25 MCG) BY MOUTH DAILY, Disp: 90 tablet, Rfl: 0   metoprolol succinate (TOPROL-XL) 50 MG 24 hr tablet, TAKE 1 TABLET(50 MG) BY MOUTH DAILY WITH OR IMMEDIATELY FOLLOWING A MEAL, Disp: 90 tablet, Rfl: 1   montelukast (SINGULAIR) 10 MG tablet, TAKE 1 TABLET(10 MG) BY MOUTH AT BEDTIME, Disp: 30 tablet, Rfl: 0   olmesartan (BENICAR) 20 MG tablet, TAKE 1 TABLET(20 MG) BY MOUTH DAILY, Disp: 90 tablet, Rfl: 0   rosuvastatin (CRESTOR) 40 MG tablet, TAKE 1 TABLET(40 MG) BY MOUTH DAILY, Disp: 90 tablet, Rfl: 1   traZODone (DESYREL) 50 MG tablet, Take 0.5-3 tablets (25-150 mg total) by mouth at bedtime as needed for sleep., Disp: 90 tablet, Rfl: 1   Medications ordered in this encounter:   Meds ordered this encounter  Medications   amoxicillin-clavulanate (AUGMENTIN) 875-125 MG tablet    Sig: Take 1 tablet by mouth 2 (two) times daily.    Dispense:  14 tablet    Refill:  0    Order Specific Question:   Supervising Provider    Answer:   MILLER, BRIAN [[id number]]   benzonatate (TESSALON) 100 MG capsule    Sig: Take 1 capsule (100 mg total) by mouth 3 (three) times daily as needed.    Dispense:  30 capsule    Refill:  0    Order Specific Question:   Supervising Provider    Answer:   Sabra Heck, Kyle     *If you need refills on other medications prior to your next appointment, please contact your pharmacy*  Follow-Up: Call back or seek an in-person evaluation if the symptoms worsen or if the condition fails to improve as anticipated.  Other Instructions Sinusitis, Adult Sinusitis is inflammation of your sinuses. Sinuses are hollow spaces in the bones around your face. Your sinuses are located: Around your eyes. In the middle of your forehead. Behind your nose. In your cheekbones. Mucus normally drains out of your sinuses. When your nasal tissues become inflamed or swollen, mucus can become trapped or blocked. This allows bacteria, viruses, and fungi to grow, which leads to infection. Most infections of the sinuses are caused by a virus. Sinusitis can develop quickly. It can last for up to 4 weeks (  acute) or for more than 12 weeks (chronic). Sinusitis often develops after a cold. What are the causes? This condition is caused by anything that creates swelling in the sinuses or stops mucus from draining. This includes: Allergies. Asthma. Infection from bacteria or viruses. Deformities or blockages in your nose or sinuses. Abnormal growths in the nose (nasal polyps). Pollutants, such as chemicals or irritants in the air. Infection from fungi (rare). What increases the risk? You are more likely to develop this condition if you: Have a weak body defense system (immune  system). Do a lot of swimming or diving. Overuse nasal sprays. Smoke. What are the signs or symptoms? The main symptoms of this condition are pain and a feeling of pressure around the affected sinuses. Other symptoms include: Stuffy nose or congestion. Thick drainage from your nose. Swelling and warmth over the affected sinuses. Headache. Upper toothache. A cough that may get worse at night. Extra mucus that collects in the throat or the back of the nose (postnasal drip). Decreased sense of smell and taste. Fatigue. A fever. Sore throat. Bad breath. How is this diagnosed? This condition is diagnosed based on: Your symptoms. Your medical history. A physical exam. Tests to find out if your condition is acute or chronic. This may include: Checking your nose for nasal polyps. Viewing your sinuses using a device that has a light (endoscope). Testing for allergies or bacteria. Imaging tests, such as an MRI or CT scan. In rare cases, a bone biopsy may be done to rule out more serious types of fungal sinus disease. How is this treated? Treatment for sinusitis depends on the cause and whether your condition is chronic or acute. If caused by a virus, your symptoms should go away on their own within 10 days. You may be given medicines to relieve symptoms. They include: Medicines that shrink swollen nasal passages (topical intranasal decongestants). Medicines that treat allergies (antihistamines). A spray that eases inflammation of the nostrils (topical intranasal corticosteroids). Rinses that help get rid of thick mucus in your nose (nasal saline washes). If caused by bacteria, your health care provider may recommend waiting to see if your symptoms improve. Most bacterial infections will get better without antibiotic medicine. You may be given antibiotics if you have: A severe infection. A weak immune system. If caused by narrow nasal passages or nasal polyps, you may need to have  surgery. Follow these instructions at home: Medicines Take, use, or apply over-the-counter and prescription medicines only as told by your health care provider. These may include nasal sprays. If you were prescribed an antibiotic medicine, take it as told by your health care provider. Do not stop taking the antibiotic even if you start to feel better. Hydrate and humidify  Drink enough fluid to keep your urine pale yellow. Staying hydrated will help to thin your mucus. Use a cool mist humidifier to keep the humidity level in your home above 50%. Inhale steam for 10-15 minutes, 3-4 times a day, or as told by your health care provider. You can do this in the bathroom while a hot shower is running. Limit your exposure to cool or dry air. Rest Rest as much as possible. Sleep with your head raised (elevated). Make sure you get enough sleep each night. General instructions  Apply a warm, moist washcloth to your face 3-4 times a day or as told by your health care provider. This will help with discomfort. Wash your hands often with soap and water to reduce your exposure  to germs. If soap and water are not available, use hand sanitizer. Do not smoke. Avoid being around people who are smoking (secondhand smoke). Keep all follow-up visits as told by your health care provider. This is important. Contact a health care provider if: You have a fever. Your symptoms get worse. Your symptoms do not improve within 10 days. Get help right away if: You have a severe headache. You have persistent vomiting. You have severe pain or swelling around your face or eyes. You have vision problems. You develop confusion. Your neck is stiff. You have trouble breathing. Summary Sinusitis is soreness and inflammation of your sinuses. Sinuses are hollow spaces in the bones around your face. This condition is caused by nasal tissues that become inflamed or swollen. The swelling traps or blocks the flow of mucus. This  allows bacteria, viruses, and fungi to grow, which leads to infection. If you were prescribed an antibiotic medicine, take it as told by your health care provider. Do not stop taking the antibiotic even if you start to feel better. Keep all follow-up visits as told by your health care provider. This is important. This information is not intended to replace advice given to you by your health care provider. Make sure you discuss any questions you have with your health care provider. Document Revised: 08/27/2017 Document Reviewed: 08/27/2017 Elsevier Patient Education  2022 [employer].    If you have been instructed to have an in-person evaluation today at a local Urgent Care facility, please use the link below. It will take you to a list of all of our available [facility], including address, phone number and hours of operation. Please do not delay care.  [facility]  If you or a family member do not have a primary care provider, use the link below to schedule a visit and establish care. When you choose a [facility] primary care physician or advanced practice provider, you gain a long-term partner in health. Find a Primary Care Provider  Learn more about [facility]'s in-office and virtual care options: [facility]

## 2021-04-07 ENCOUNTER — Other Ambulatory Visit: Payer: Self-pay | Admitting: Internal Medicine

## 2021-04-07 DIAGNOSIS — M545 Low back pain, unspecified: Secondary | ICD-10-CM

## 2021-04-21 ENCOUNTER — Other Ambulatory Visit: Payer: Self-pay | Admitting: Internal Medicine

## 2021-04-21 DIAGNOSIS — E782 Mixed hyperlipidemia: Secondary | ICD-10-CM

## 2021-04-25 ENCOUNTER — Other Ambulatory Visit: Payer: Self-pay | Admitting: Internal Medicine

## 2021-04-25 ENCOUNTER — Other Ambulatory Visit: Payer: Self-pay | Admitting: Registered Nurse

## 2021-04-25 DIAGNOSIS — G479 Sleep disorder, unspecified: Secondary | ICD-10-CM

## 2021-04-25 DIAGNOSIS — E039 Hypothyroidism, unspecified: Secondary | ICD-10-CM

## 2021-05-06 ENCOUNTER — Other Ambulatory Visit: Payer: Self-pay

## 2021-05-06 ENCOUNTER — Ambulatory Visit (INDEPENDENT_AMBULATORY_CARE_PROVIDER_SITE_OTHER): Payer: PPO

## 2021-05-06 DIAGNOSIS — Z0000 Encounter for general adult medical examination without abnormal findings: Secondary | ICD-10-CM

## 2021-05-06 NOTE — Patient Instructions (Signed)
Jesse Bell , Thank you for taking time to come for your Medicare Wellness Visit. I appreciate your ongoing commitment to your health goals. Please review the following plan we discussed and let me know if I can assist you in the future.   Screening recommendations/referrals: Colonoscopy: Cologuard 09/13/2020 Recommended yearly ophthalmology/optometry visit for glaucoma screening and checkup Recommended yearly dental visit for hygiene and checkup  Vaccinations: Influenza vaccine: Completed  Pneumococcal vaccine: completed  Tdap vaccine: 08/05/2020 Shingles vaccine: will consider     Advanced directives: yes   Conditions/risks identified: none   Next appointment: none   Preventive Care 40-64 Years, Male Preventive care refers to lifestyle choices and visits with your health care provider that can promote health and wellness. What does preventive care include? A yearly physical exam. This is also called an annual well check. Dental exams once or twice a year. Routine eye exams. Ask your health care provider how often you should have your eyes checked. Personal lifestyle choices, including: Daily care of your teeth and gums. Regular physical activity. Eating a healthy diet. Avoiding tobacco and drug use. Limiting alcohol use. Practicing safe sex. Taking low-dose aspirin every day starting at age 60. What happens during an annual well check? The services and screenings done by your health care provider during your annual well check will depend on your age, overall health, lifestyle risk factors, and family history of disease. Counseling  Your health care provider may ask you questions about your: Alcohol use. Tobacco use. Drug use. Emotional well-being. Home and relationship well-being. Sexual activity. Eating habits. Work and work Statistician. Screening  You may have the following tests or measurements: Height, weight, and BMI. Blood pressure. Lipid and cholesterol levels.  These may be checked every 5 years, or more frequently if you are over 47 years old. Skin check. Lung cancer screening. You may have this screening every year starting at age 3 if you have a 30-pack-year history of smoking and currently smoke or have quit within the past 15 years. Fecal occult blood test (FOBT) of the stool. You may have this test every year starting at age 68. Flexible sigmoidoscopy or colonoscopy. You may have a sigmoidoscopy every 5 years or a colonoscopy every 10 years starting at age 72. Prostate cancer screening. Recommendations will vary depending on your family history and other risks. Hepatitis C blood test. Hepatitis B blood test. Sexually transmitted disease (STD) testing. Diabetes screening. This is done by checking your blood sugar (glucose) after you have not eaten for a while (fasting). You may have this done every 1-3 years. Discuss your test results, treatment options, and if necessary, the need for more tests with your health care provider. Vaccines  Your health care provider may recommend certain vaccines, such as: Influenza vaccine. This is recommended every year. Tetanus, diphtheria, and acellular pertussis (Tdap, Td) vaccine. You may need a Td booster every 10 years. Zoster vaccine. You may need this after age 70. Pneumococcal 13-valent conjugate (PCV13) vaccine. You may need this if you have certain conditions and have not been vaccinated. Pneumococcal polysaccharide (PPSV23) vaccine. You may need one or two doses if you smoke cigarettes or if you have certain conditions. Talk to your health care provider about which screenings and vaccines you need and how often you need them. This information is not intended to replace advice given to you by your health care provider. Make sure you discuss any questions you have with your health care provider. Document Released: 04/23/2015 Document Revised:  12/15/2015 Document Reviewed: 01/26/2015 Elsevier Interactive  Patient Education  2017 [employer] Prevention in the Home Falls can cause injuries. They can happen to people of all ages. There are many things you can do to make your home safe and to help prevent falls. What can I do on the outside of my home? Regularly fix the edges of walkways and driveways and fix any cracks. Remove anything that might make you trip as you walk through a door, such as a raised step or threshold. Trim any bushes or trees on the path to your home. Use bright outdoor lighting. Clear any walking paths of anything that might make someone trip, such as rocks or tools. Regularly check to see if handrails are loose or broken. Make sure that both sides of any steps have handrails. Any raised decks and porches should have guardrails on the edges. Have any leaves, snow, or ice cleared regularly. Use sand or salt on walking paths during winter. Clean up any spills in your garage right away. This includes oil or grease spills. What can I do in the bathroom? Use night lights. Install grab bars by the toilet and in the tub and shower. Do not use towel bars as grab bars. Use non-skid mats or decals in the tub or shower. If you need to sit down in the shower, use a plastic, non-slip stool. Keep the floor dry. Clean up any water that spills on the floor as soon as it happens. Remove soap buildup in the tub or shower regularly. Attach bath mats securely with double-sided non-slip rug tape. Do not have throw rugs and other things on the floor that can make you trip. What can I do in the bedroom? Use night lights. Make sure that you have a light by your bed that is easy to reach. Do not use any sheets or blankets that are too big for your bed. They should not hang down onto the floor. Have a firm chair that has side arms. You can use this for support while you get dressed. Do not have throw rugs and other things on the floor that can make you trip. What can I do in the  kitchen? Clean up any spills right away. Avoid walking on wet floors. Keep items that you use a lot in easy-to-reach places. If you need to reach something above you, use a strong step stool that has a grab bar. Keep electrical cords out of the way. Do not use floor polish or wax that makes floors slippery. If you must use wax, use non-skid floor wax. Do not have throw rugs and other things on the floor that can make you trip. What can I do with my stairs? Do not leave any items on the stairs. Make sure that there are handrails on both sides of the stairs and use them. Fix handrails that are broken or loose. Make sure that handrails are as long as the stairways. Check any carpeting to make sure that it is firmly attached to the stairs. Fix any carpet that is loose or worn. Avoid having throw rugs at the top or bottom of the stairs. If you do have throw rugs, attach them to the floor with carpet tape. Make sure that you have a light switch at the top of the stairs and the bottom of the stairs. If you do not have them, ask someone to add them for you. What else can I do to help prevent falls? Wear shoes  that: Do not have high heels. Have rubber bottoms. Are comfortable and fit you well. Are closed at the toe. Do not wear sandals. If you use a stepladder: Make sure that it is fully opened. Do not climb a closed stepladder. Make sure that both sides of the stepladder are locked into place. Ask someone to hold it for you, if possible. Clearly mark and make sure that you can see: Any grab bars or handrails. First and last steps. Where the edge of each step is. Use tools that help you move around (mobility aids) if they are needed. These include: Canes. Walkers. Scooters. Crutches. Turn on the lights when you go into a dark area. Replace any light bulbs as soon as they burn out. Set up your furniture so you have a clear path. Avoid moving your furniture around. If any of your floors are  uneven, fix them. If there are any pets around you, be aware of where they are. Review your medicines with your doctor. Some medicines can make you feel dizzy. This can increase your chance of falling. Ask your doctor what other things that you can do to help prevent falls. This information is not intended to replace advice given to you by your health care provider. Make sure you discuss any questions you have with your health care provider. Document Released: 01/21/2009 Document Revised: 09/02/2015 Document Reviewed: 05/01/2014 Elsevier Interactive Patient Education  2017 [employer].

## 2021-05-06 NOTE — Progress Notes (Signed)
Subjective:   Jesse Bell is a 61 y.o. male who presents for an Subsequent  Medicare Annual Wellness Visit.   I connected with Jesse Bell today by telephone and verified that I am speaking with the correct person using two identifiers. Location patient: home Location provider: work Persons participating in the virtual visit: patient, provider.   I discussed the limitations, risks, security and privacy concerns of performing an evaluation and management service by telephone and the availability of in person appointments. I also discussed with the patient that there may be a patient responsible charge related to this service. The patient expressed understanding and verbally consented to this telephonic visit.    Interactive audio and video telecommunications were attempted between this provider and patient, however failed, due to patient having technical difficulties OR patient did not have access to video capability.  We continued and completed visit with audio only.    Review of Systems     Cardiac Risk Factors include: advanced age (>71men, >35 women);male gender     Objective:    Today's Vitals   There is no height or weight on file to calculate BMI.  Advanced Directives 05/06/2021 03/18/2020 12/19/2018 12/06/2016 01/04/2016 04/14/2015  Does Patient Have a Medical Advance Directive? No No No No No No  Would patient like information on creating a medical advance directive? No - Patient declined No - Patient declined No - Patient declined No - Patient declined No - patient declined information No - patient declined information    Current Medications (verified) Outpatient Encounter Medications as of 05/06/2021  Medication Sig   albuterol (VENTOLIN HFA) 108 (90 Base) MCG/ACT inhaler Inhale 2 puffs into the lungs every 6 (six) hours as needed for wheezing or shortness of breath (cough, shortness of breath or wheezing.).   etodolac (LODINE) 400 MG tablet TAKE 1 TABLET(400 MG) BY MOUTH DAILY    ipratropium (ATROVENT) 0.03 % nasal spray Place 2 sprays into both nostrils 2 (two) times daily as needed for rhinitis.   levothyroxine (SYNTHROID) 25 MCG tablet TAKE 1 TABLET(25 MCG) BY MOUTH DAILY   metoprolol succinate (TOPROL-XL) 50 MG 24 hr tablet TAKE 1 TABLET(50 MG) BY MOUTH DAILY WITH OR IMMEDIATELY FOLLOWING A MEAL   montelukast (SINGULAIR) 10 MG tablet TAKE 1 TABLET(10 MG) BY MOUTH AT BEDTIME   olmesartan (BENICAR) 20 MG tablet TAKE 1 TABLET(20 MG) BY MOUTH DAILY   rosuvastatin (CRESTOR) 40 MG tablet TAKE 1 TABLET(40 MG) BY MOUTH DAILY   traZODone (DESYREL) 50 MG tablet TAKE 1/2 TO 3 TABLETS(25 TO 150 MG) BY MOUTH AT BEDTIME AS NEEDED FOR SLEEP   amoxicillin-clavulanate (AUGMENTIN) 875-125 MG tablet Take 1 tablet by mouth 2 (two) times daily.   benzonatate (TESSALON) 100 MG capsule Take 1 capsule (100 mg total) by mouth 3 (three) times daily as needed.   No facility-administered encounter medications on file as of 05/06/2021.    Allergies (verified) Lisinopril   History: Past Medical History:  Diagnosis Date   Allergy    Back pain    Hyperlipidemia    Hypertension    Hypertriglyceridemia    Past Surgical History:  Procedure Laterality Date   FRACTURE SURGERY     x6   KNEE SURGERY     Family History  Problem Relation Age of Onset   Pulmonary fibrosis Father    Pulmonary fibrosis Brother    Heart Problems Brother    Skin cancer Brother    Macular degeneration Brother    Social History  Socioeconomic History   Marital status: Divorced    Spouse name: Not on file   Number of children: 1   Years of education: Not on file   Highest education level: Not on file  Occupational History   Not on file  Tobacco Use   Smoking status: Never   Smokeless tobacco: Never  Vaping Use   Vaping Use: Never used  Substance and Sexual Activity   Alcohol use: Yes    Alcohol/week: 1.0 - 2.0 standard drink    Types: 1 - 2 Cans of beer per week    Comment: 1-2 times a week     Drug use: No   Sexual activity: Not Currently  Other Topics Concern   Not on file  Social History Narrative   Not on file   Social Determinants of Health   Financial Resource Strain: Low Risk    Difficulty of Paying Living Expenses: Not hard at all  Food Insecurity: No Food Insecurity   Worried About Charity fundraiser in the Last Year: Never true   [place] in the Last Year: Never true  Transportation Needs: No Transportation Needs   Lack of Transportation (Medical): No   Lack of Transportation (Non-Medical): No  Physical Activity: Insufficiently Active   Days of Exercise per Week: 3 days   Minutes of Exercise per Session: 30 min  Stress: No Stress Concern Present   Feeling of Stress : Not at all  Social Connections: Moderately Isolated   Frequency of Communication with Friends and Family: Three times a week   Frequency of Social Gatherings with Friends and Family: Three times a week   Attends Religious Services: Never   Active Member of Clubs or Organizations: No   Attends Music therapist: Never   Marital Status: Living with partner    Tobacco Counseling Counseling given: Not Answered   Clinical Intake:  Pre-visit preparation completed: Yes  Pain : No/denies pain     Nutritional Risks: None Diabetes: No  How often do you need to have someone help you when you read instructions, pamphlets, or other written materials from your doctor or pharmacy?: 1 - Never What is the last grade level you completed in school?: high School  Diabetic?no   Interpreter Needed?: No  Information entered by :: L.Seini Lannom,LPN   Activities of Daily Living In your present state of health, do you have any difficulty performing the following activities: 05/06/2021  Hearing? N  Vision? N  Difficulty concentrating or making decisions? N  Walking or climbing stairs? N  Dressing or bathing? N  Doing errands, shopping? N  Preparing Food and eating ? N  Using the  Toilet? N  In the past six months, have you accidently leaked urine? N  Do you have problems with loss of bowel control? N  Managing your Medications? N  Managing your Finances? N  Housekeeping or managing your Housekeeping? N  Some recent data might be hidden    Patient Care Team: Janith Lima, MD as PCP - General (Internal Medicine)  Indicate any recent Medical Services you may have received from other than [facility] providers in the past year (date may be approximate).     Assessment:   This is a routine wellness examination for [facility].  Hearing/Vision screen Vision Screening - Comments:: Annual eye exams wear glasses   Dietary issues and exercise activities discussed: Current Exercise Habits: Home exercise routine, Type of exercise: walking, Time (Minutes): 30, Frequency (Times/Week): 2,  Weekly Exercise (Minutes/Week): 60, Intensity: Mild, Exercise limited by: orthopedic condition(s)   Goals Addressed             This Visit's Progress    declined   On track    Patient declined goal at this time.        Depression Screen PHQ 2/9 Scores 05/06/2021 05/06/2021 08/05/2020 03/18/2020 02/24/2020 12/23/2019 05/14/2019  PHQ - 2 Score 0 0 0 0 0 0 0    Fall Risk Fall Risk  05/06/2021 08/05/2020 03/18/2020 02/24/2020 12/23/2019  Falls in the past year? 0 0 0 0 0  Number falls in past yr: 0 0 0 0 0  Injury with Fall? 0 0 0 0 0  Risk for fall due to : - - - - -  Risk for fall due to: Comment - - - - -  Follow up Falls evaluation completed - Falls evaluation completed;Education provided Falls evaluation completed Falls evaluation completed    [place]:  Any stairs in or around the home? Yes  If so, are there any without handrails? No  Home free of loose throw rugs in walkways, pet beds, electrical cords, etc? Yes  Adequate lighting in your home to reduce risk of falls? Yes   ASSISTIVE DEVICES UTILIZED TO PREVENT FALLS:  Life alert? No  Use of a  cane, walker or w/c? Yes  Grab bars in the bathroom? Yes  Shower chair or bench in shower? No  Elevated toilet seat or a handicapped toilet? No     Cognitive Function:    Normal cognitive status assessed by direct observation by this Nurse Health Advisor. No abnormalities found.   6CIT Screen 03/18/2020 12/19/2018 12/06/2016  What Year? 0 points 0 points 0 points  What month? 0 points 0 points 0 points  What time? 0 points 0 points 0 points  Count back from 20 0 points 0 points 0 points  Months in reverse 0 points 0 points 0 points  Repeat phrase 0 points 0 points 2 points  Total Score 0 0 2    Immunizations Immunization History  Administered Date(s) Administered   Influenza,inj,Quad PF,6+ Mos 02/12/2017, 02/08/2018   Influenza-Unspecified 02/12/2017, 02/04/2020   PFIZER(Purple Top)SARS-COV-2 Vaccination 06/28/2019, 07/19/2019, 02/02/2020   PNEUMOCOCCAL CONJUGATE-20 08/05/2020   Tdap 08/05/2020    TDAP status: Up to date  Flu Vaccine status: Up to date  Pneumococcal vaccine status: Up to date  Covid-19 vaccine status: Completed vaccines  Qualifies for Shingles Vaccine? Yes   Zostavax completed No   Shingrix Completed?: No.    Education has been provided regarding the importance of this vaccine. Patient has been advised to call insurance company to determine out of pocket expense if they have not yet received this vaccine. Advised may also receive vaccine at local pharmacy or Health Dept. Verbalized acceptance and understanding.  Screening Tests Health Maintenance  Topic Date Due   Zoster Vaccines- Shingrix (1 of 2) Never done   COVID-19 Vaccine (4 - Booster for Pfizer series) 03/29/2020   INFLUENZA VACCINE  11/08/2020   Fecal DNA (Cologuard)  09/14/2023   TETANUS/TDAP  08/06/2030   Hepatitis C Screening  Completed   HIV Screening  Completed   HPV VACCINES  Aged Out    Health Maintenance  Health Maintenance Due  Topic Date Due   Zoster Vaccines- Shingrix (1 of  2) Never done   COVID-19 Vaccine (4 - Booster for Pfizer series) 03/29/2020   INFLUENZA VACCINE  11/08/2020  Colorectal cancer screening: Type of screening: Cologuard. Completed 09/13/2020. Repeat every 3 years  Lung Cancer Screening: (Low Dose CT Chest recommended if Age 41-80 years, 30 pack-year currently smoking OR have quit w/in 15years.) does not qualify.   Lung Cancer Screening Referral: n/a  Additional Screening:  Hepatitis C Screening: does not qualify;   Vision Screening: Recommended annual ophthalmology exams for early detection of glaucoma and other disorders of the eye. Is the patient up to date with their annual eye exam?  Yes  Who is the provider or what is the name of the office in which the patient attends annual eye exams? Dr.Palmer  If pt is not established with a provider, would they like to be referred to a provider to establish care? No .   Dental Screening: Recommended annual dental exams for proper oral hygiene  Community Resource Referral / Chronic Care Management: CRR required this visit?  No   CCM required this visit?  No      Plan:     I have personally reviewed and noted the following in the patients chart:   Medical and social history Use of alcohol, tobacco or illicit drugs  Current medications and supplements including opioid prescriptions. Patient is not currently taking opioid prescriptions. Functional ability and status Nutritional status Physical activity Advanced directives List of other physicians Hospitalizations, surgeries, and ER visits in previous 12 months Vitals Screenings to include cognitive, depression, and falls Referrals and appointments  In addition, I have reviewed and discussed with patient certain preventive protocols, quality metrics, and best practice recommendations. A written personalized care plan for preventive services as well as general preventive health recommendations were provided to patient.      Randel Pigg, LPN   [id number]   Nurse Notes: none

## 2021-05-09 ENCOUNTER — Other Ambulatory Visit: Payer: Self-pay | Admitting: Internal Medicine

## 2021-05-09 DIAGNOSIS — I10 Essential (primary) hypertension: Secondary | ICD-10-CM

## 2021-05-09 DIAGNOSIS — E039 Hypothyroidism, unspecified: Secondary | ICD-10-CM

## 2021-05-11 ENCOUNTER — Telehealth: Payer: Self-pay | Admitting: Internal Medicine

## 2021-05-11 ENCOUNTER — Other Ambulatory Visit: Payer: Self-pay | Admitting: Internal Medicine

## 2021-05-11 DIAGNOSIS — E039 Hypothyroidism, unspecified: Secondary | ICD-10-CM

## 2021-05-11 DIAGNOSIS — I10 Essential (primary) hypertension: Secondary | ICD-10-CM

## 2021-05-11 MED ORDER — OLMESARTAN MEDOXOMIL 20 MG PO TABS: ORAL_TABLET | ORAL | 0 refills | Status: DC

## 2021-05-11 MED ORDER — LEVOTHYROXINE SODIUM 25 MCG PO TABS: ORAL_TABLET | ORAL | 0 refills | Status: DC

## 2021-05-11 NOTE — Telephone Encounter (Signed)
Pt requesting a short supply of levothyroxine (SYNTHROID) 25 MCG tablet and olmesartan (BENICAR) 20 MG tablet   until next ov on 06-29-2021  Pt was last seen 08-05-2020

## 2021-05-31 ENCOUNTER — Encounter: Payer: Self-pay | Admitting: Neurology

## 2021-05-31 DIAGNOSIS — R251 Tremor, unspecified: Secondary | ICD-10-CM | POA: Diagnosis not present

## 2021-05-31 DIAGNOSIS — G969 Disorder of central nervous system, unspecified: Secondary | ICD-10-CM | POA: Diagnosis not present
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
## 2021-06-14 ENCOUNTER — Encounter: Payer: Self-pay | Admitting: Neurology

## 2021-06-14 ENCOUNTER — Ambulatory Visit: Payer: PPO | Admitting: Neurology

## 2021-06-14 ENCOUNTER — Other Ambulatory Visit: Payer: Self-pay

## 2021-06-14 VITALS — BP 122/83 | HR 79 | Ht 69.0 in | Wt 201.4 lb

## 2021-06-14 DIAGNOSIS — J383 Other diseases of vocal cords: Secondary | ICD-10-CM

## 2021-06-14 DIAGNOSIS — R251 Tremor, unspecified: Secondary | ICD-10-CM | POA: Diagnosis not present

## 2021-06-14 MED ORDER — PRIMIDONE 50 MG PO TABS: 50.0000 mg | ORAL_TABLET | Freq: Two times a day (BID) | ORAL | 1 refills | Status: DC

## 2021-06-14 NOTE — Patient Instructions (Addendum)
Start primidone 50 mg - 1/2 tablet at bedtime for 1 week and then increase to 1 tablet at bedtime x 1 week, then take 1 tablet twice per day thereafter ? ? ?A referral to [facility] has been placed for your MRI someone will contact you directly to schedule your appt. They are located at [place]. Please contact them directly by calling [phone] with any questions regarding your referral.  ? ?Essential Tremor ?A tremor is trembling or shaking that a person cannot control. Most tremors affect the hands or arms. Tremors can also affect the head, vocal cords, legs, and other parts of the body. Essential tremor is a tremor without a known cause. Usually, it occurs while a person is trying to perform an action. It tends to get worse gradually as a person ages. ?What are the causes? ?The cause of this condition is not known. ?What increases the risk? ?You are more likely to develop this condition if: ?You have a family member with essential tremor. ?You are age 62 or older. ?You take certain medicines. ?What are the signs or symptoms? ?The main sign of a tremor is a rhythmic shaking of certain parts of your body that is uncontrolled and unintentional. You may: ?Have difficulty eating with a spoon or fork. ?Have difficulty writing. ?Nod your head up and down or side to side. ?Have a quivering voice. ?The shaking may: ?Get worse over time. ?Come and go. ?Be more noticeable on one side of your body. ?Get worse due to stress, fatigue, caffeine, and extreme heat or cold. ?How is this diagnosed? ?This condition may be diagnosed based on: ?Your symptoms and medical history. ?A physical exam. ?There is no single test to diagnose an essential tremor. However, your health care provider may order tests to rule out other causes of your condition. These may include: ?Blood and urine tests. ?Imaging studies of your brain, such as CT scan and MRI. ?A test that measures involuntary muscle movement  (electromyogram). ?How is this treated? ?Treatment for essential tremor depends on the severity of the condition. ?Some tremors may go away without treatment. ?Mild tremors may not need treatment if they do not affect your day-to-day life. ?Severe tremors may need to be treated using one or more of the following options: ?Medicines. ?Lifestyle changes. ?Occupational or physical therapy. ?Follow these instructions at home: ?Lifestyle ? ?Do not use any products that contain nicotine or tobacco, such as cigarettes and e-cigarettes. If you need help quitting, ask your health care provider. ?Limit your caffeine intake as told by your health care provider. ?Try to get 8 hours of sleep each night. ?Find ways to manage your stress that fits your lifestyle and personality. Consider trying meditation or yoga. ?Try to anticipate stressful situations and allow extra time to manage them. ?If you are struggling emotionally with the effects of your tremor, consider working with a mental health provider. ?General instructions ?Take over-the-counter and prescription medicines only as told by your health care provider. ?Avoid extreme heat and extreme cold. ?Keep all follow-up visits as told by your health care provider. This is important. Visits may include physical therapy visits. ?Contact a health care provider if: ?You experience any changes in the location or intensity of your tremors. ?You start having a tremor after starting a new medicine. ?You have tremor with other symptoms, such as: ?Numbness. ?Tingling. ?Pain. ?Weakness. ?Your tremor gets worse. ?Your tremor interferes with your daily life. ?You feel down, blue, or sad for at  least 2 weeks in a row. ?Worrying about your tremor and what other people think about you interferes with your everyday life functions, including relationships, work, or school. ?Summary ?Essential tremor is a tremor without a known cause. Usually, it occurs when you are trying to perform an  action. ?You are more likely to develop this condition if you have a family member with essential tremor. ?The main sign of a tremor is a rhythmic shaking of certain parts of your body that is uncontrolled and unintentional. ?Treatment for essential tremor depends on the severity of the condition. ?This information is not intended to replace advice given to you by your health care provider. Make sure you discuss any questions you have with your health care provider. ?Document Revised: 12/17/2019 Document Reviewed: 12/19/2019 ?Elsevier Patient Education ? [employer]. ? ?

## 2021-06-29 ENCOUNTER — Encounter: Payer: Self-pay | Admitting: Internal Medicine

## 2021-06-29 ENCOUNTER — Other Ambulatory Visit: Payer: Self-pay

## 2021-06-29 ENCOUNTER — Ambulatory Visit (INDEPENDENT_AMBULATORY_CARE_PROVIDER_SITE_OTHER): Payer: PPO | Admitting: Internal Medicine

## 2021-06-29 VITALS — BP 126/80 | HR 71 | Temp 98.1°F | Resp 16 | Ht 69.0 in | Wt 212.0 lb

## 2021-06-29 DIAGNOSIS — M545 Low back pain, unspecified: Secondary | ICD-10-CM | POA: Diagnosis not present

## 2021-06-29 DIAGNOSIS — I10 Essential (primary) hypertension: Secondary | ICD-10-CM

## 2021-06-29 DIAGNOSIS — J301 Allergic rhinitis due to pollen: Secondary | ICD-10-CM

## 2021-06-29 DIAGNOSIS — G8929 Other chronic pain: Secondary | ICD-10-CM | POA: Diagnosis not present

## 2021-06-29 DIAGNOSIS — E039 Hypothyroidism, unspecified: Secondary | ICD-10-CM

## 2021-06-29 LAB — BASIC METABOLIC PANEL
BUN: 17 mg/dL (ref 6–23)
CO2: 28 mEq/L (ref 19–32)
Calcium: 9 mg/dL (ref 8.4–10.5)
Chloride: 100 mEq/L (ref 96–112)
Creatinine, Ser: 0.79 mg/dL (ref 0.40–1.50)
GFR: 96.04 mL/min (ref >60.00)
Glucose, Bld: 102 mg/dL — ABNORMAL HIGH (ref 70–99)
Potassium: 4.5 mEq/L (ref 3.5–5.1)
Sodium: 136 mEq/L (ref 135–145)

## 2021-06-29 LAB — CBC WITH DIFFERENTIAL/PLATELET
Basophils Absolute: 0.1 10*3/uL (ref 0.0–0.1)
Basophils Relative: 1.3 % (ref 0.0–3.0)
Eosinophils Absolute: 0.3 10*3/uL (ref 0.0–0.7)
Eosinophils Relative: 4.8 % (ref 0.0–5.0)
HCT: 39.4 % (ref 39.0–52.0)
Hemoglobin: 13 g/dL (ref 13.0–17.0)
Lymphocytes Relative: 21.9 % (ref 12.0–46.0)
Lymphs Abs: 1.6 10*3/uL (ref 0.7–4.0)
MCHC: 32.9 g/dL (ref 30.0–36.0)
MCV: 87.1 fl (ref 78.0–100.0)
Monocytes Absolute: 0.7 10*3/uL (ref 0.1–1.0)
Monocytes Relative: 9.2 % (ref 3.0–12.0)
Neutro Abs: 4.5 10*3/uL (ref 1.4–7.7)
Neutrophils Relative %: 62.8 % (ref 43.0–77.0)
Platelets: 316 10*3/uL (ref 150.0–400.0)
RBC: 4.52 Mil/uL (ref 4.22–5.81)
RDW: 12.9 % (ref 11.5–15.5)
WBC: 7.1 10*3/uL (ref 4.0–10.5)

## 2021-06-29 LAB — TSH: TSH: 1.56 u[IU]/mL (ref 0.35–5.50)

## 2021-06-29 MED ORDER — ETODOLAC 400 MG PO TABS: ORAL_TABLET | ORAL | 0 refills | Status: DC

## 2021-06-29 MED ORDER — IPRATROPIUM BROMIDE 0.03 % NA SOLN: 2.0000 | Freq: Two times a day (BID) | NASAL | 1 refills | Status: AC | PRN

## 2021-06-29 NOTE — Progress Notes (Signed)
? ?Subjective:  ?Patient ID: Jesse Bell, male    DOB: 11-25-1960  Age: 61 y.o. MRN: [id number] ? ?CC: Hypertension and Hypothyroidism ? ?This visit occurred during the SARS-CoV-2 public health emergency.  Safety protocols were in place, including screening questions prior to the visit, additional usage of staff PPE, and extensive cleaning of exam room while observing appropriate contact time as indicated for disinfecting solutions.   ? ?HPI ?Curran A Luff presents for f/up - ? ?He walks about one block each day and does not complain of DOE, CP, palpitations, diaphoresis, or edema. ? ?Outpatient Medications Prior to Visit  ?Medication Sig Dispense Refill  ? albuterol (VENTOLIN HFA) 108 (90 Base) MCG/ACT inhaler Inhale 2 puffs into the lungs every 6 (six) hours as needed for wheezing or shortness of breath (cough, shortness of breath or wheezing.). 18 g 2  ? levothyroxine (SYNTHROID) 25 MCG tablet TAKE 1 TABLET(25 MCG) BY MOUTH DAILY 90 tablet 0  ? metoprolol succinate (TOPROL-XL) 50 MG 24 hr tablet TAKE 1 TABLET(50 MG) BY MOUTH DAILY WITH OR IMMEDIATELY FOLLOWING A MEAL 90 tablet 1  ? olmesartan (BENICAR) 20 MG tablet TAKE 1 TABLET(20 MG) BY MOUTH DAILY 90 tablet 0  ? primidone (MYSOLINE) 50 MG tablet Take 1 tablet (50 mg total) by mouth 2 (two) times daily. 180 tablet 1  ? rosuvastatin (CRESTOR) 40 MG tablet TAKE 1 TABLET(40 MG) BY MOUTH DAILY 90 tablet 1  ? traZODone (DESYREL) 50 MG tablet TAKE 1/2 TO 3 TABLETS(25 TO 150 MG) BY MOUTH AT BEDTIME AS NEEDED FOR SLEEP 90 tablet 1  ? etodolac (LODINE) 400 MG tablet TAKE 1 TABLET(400 MG) BY MOUTH DAILY 90 tablet 0  ? ipratropium (ATROVENT) 0.03 % nasal spray Place 2 sprays into both nostrils 2 (two) times daily as needed for rhinitis. 30 mL 5  ? ?No facility-administered medications prior to visit.  ? ? ?ROS ?Review of Systems  ?Constitutional:  Positive for unexpected weight change (wt gain). Negative for chills, diaphoresis and fatigue.  ?HENT:  Positive for postnasal  drip and rhinorrhea. Negative for sinus pressure.   ?Respiratory:  Negative for cough, shortness of breath and wheezing.   ?Cardiovascular:  Negative for chest pain, palpitations and leg swelling.  ?Gastrointestinal:  Negative for abdominal pain, constipation, diarrhea and vomiting.  ?Endocrine: Negative.   ?Genitourinary: Negative.   ?Musculoskeletal:  Positive for arthralgias, back pain and gait problem.  ?Skin: Negative.  Negative for color change, pallor and rash.  ?Allergic/Immunologic: Negative.   ?Hematological:  Negative for adenopathy. Does not bruise/bleed easily.  ?Psychiatric/Behavioral: Negative.    ? ?Objective:  ?BP 126/80 (BP Location: Left Arm, Patient Position: Sitting, Cuff Size: Large)   Pulse 71   Temp 98.1 ?F (36.7 ?C) (Oral)   Resp 16   Ht '5\' 9"'$  (1.753 m)   Wt 212 lb (96.2 kg)   SpO2 97%   BMI 31.31 kg/m?  ? ?BP Readings from Last 3 Encounters:  ?06/29/21 126/80  ?06/14/21 122/83  ?09/03/20 122/77  ? ? ?Wt Readings from Last 3 Encounters:  ?06/29/21 212 lb (96.2 kg)  ?06/14/21 201 lb 6.4 oz (91.4 kg)  ?09/03/20 211 lb (95.7 kg)  ? ? ?Physical Exam ?Vitals reviewed.  ?HENT:  ?   Nose: Nose normal.  ?   Mouth/Throat:  ?   Mouth: Mucous membranes are moist.  ?Eyes:  ?   General: No scleral icterus. ?   Conjunctiva/sclera: Conjunctivae normal.  ?Cardiovascular:  ?   Rate and Rhythm: Normal  rate and regular rhythm.  ?   Heart sounds: No murmur heard. ?Pulmonary:  ?   Effort: Pulmonary effort is normal.  ?   Breath sounds: No stridor. No wheezing, rhonchi or rales.  ?Abdominal:  ?   General: Abdomen is flat.  ?   Palpations: There is no mass.  ?   Tenderness: There is no abdominal tenderness. There is no guarding.  ?   Hernia: No hernia is present.  ?Musculoskeletal:     ?   General: Normal range of motion.  ?   Cervical back: Neck supple.  ?   Right lower leg: No edema.  ?   Left lower leg: No edema.  ?Lymphadenopathy:  ?   Cervical: No cervical adenopathy.  ?Skin: ?   General: Skin is warm  and dry.  ?Neurological:  ?   General: No focal deficit present.  ?   Mental Status: He is alert.  ?Psychiatric:     ?   Mood and Affect: Mood normal.     ?   Behavior: Behavior normal.  ? ? ?Lab Results  ?Component Value Date  ? WBC 7.1 06/29/2021  ? HGB 13.0 06/29/2021  ? HCT 39.4 06/29/2021  ? PLT 316.0 06/29/2021  ? GLUCOSE 102 (H) 06/29/2021  ? CHOL 159 08/09/2020  ? TRIG 191.0 (H) 08/09/2020  ? HDL 48.70 08/09/2020  ? Howe 72 08/09/2020  ? ALT 15 08/09/2020  ? AST 27 08/09/2020  ? NA 136 06/29/2021  ? K 4.5 06/29/2021  ? CL 100 06/29/2021  ? CREATININE 0.79 06/29/2021  ? BUN 17 06/29/2021  ? CO2 28 06/29/2021  ? TSH 1.56 06/29/2021  ? PSA 0.10 08/05/2020  ? ? ?VAS [place] LOWER EXTREMITY VENOUS (DVT) ? ?Result Date: 08/27/2018 ? Lower Venous Study Indications: Swelling, and Pain.  Performing Technologist: [name] RVS  Examination Guidelines: A complete evaluation includes B-mode imaging, spectral Doppler, color Doppler, and power Doppler as needed of all accessible portions of each vessel. Bilateral testing is considered an integral part of a complete examination. Limited examinations for reoccurring indications may be performed as noted.  +---------+---------------+---------+-----------+----------+-------+ RIGHT    CompressibilityPhasicitySpontaneityPropertiesSummary +---------+---------------+---------+-----------+----------+-------+ CFV      Full           Yes      Yes                          +---------+---------------+---------+-----------+----------+-------+ SFJ      Full                                                 +---------+---------------+---------+-----------+----------+-------+ FV Prox  Full                                                 +---------+---------------+---------+-----------+----------+-------+ FV Mid   Full                                                 +---------+---------------+---------+-----------+----------+-------+ FV DistalFull                                                  +---------+---------------+---------+-----------+----------+-------+  PFV      Full                                                 +---------+---------------+---------+-----------+----------+-------+ POP      Full           Yes      Yes                          +---------+---------------+---------+-----------+----------+-------+ PTV      Full                                                 +---------+---------------+---------+-----------+----------+-------+ PERO     Full                                                 +---------+---------------+---------+-----------+----------+-------+   +----+---------------+---------+-----------+----------+-------+ LEFTCompressibilityPhasicitySpontaneityPropertiesSummary +----+---------------+---------+-----------+----------+-------+ CFV Full           Yes      Yes                          +----+---------------+---------+-----------+----------+-------+     Summary: Right: There is no evidence of deep vein thrombosis in the lower extremity. No cystic structure found in the popliteal fossa. Left: No evidence of common femoral vein obstruction.  *See table(s) above for measurements and observations. Electronically signed by Harold Barban MD on 08/27/2018 at 2:52:04 PM.    Final   ? ? ?Assessment & Plan:  ? ?James was seen today for hypertension and hypothyroidism. ? ?Diagnoses and all orders for this visit: ? ?Primary hypertension- His BP is well controlled. ?-     Basic metabolic panel; Future ?-     CBC with Differential/Platelet; Future ?-     TSH; Future ?-     TSH ?-     CBC with Differential/Platelet ?-     Basic metabolic panel ? ?Chronic bilateral low back pain without sciatica ?-     etodolac (LODINE) 400 MG tablet; TAKE 1 TABLET(400 MG) BY MOUTH DAILY ? ?Seasonal allergic rhinitis due to pollen ?-     ipratropium (ATROVENT) 0.03 % nasal spray; Place 2 sprays into both nostrils 2 (two) times  daily as needed for rhinitis. ? ?Acquired hypothyroidism- His TSH is on the normal range. Will continue the current T4 dose. ?-     TSH; Future ?-     TSH ? ? ?I am having Ladarrian A. Hershberger maintain his albuterol, metoprolol succinate, rosuvastatin, traZODone, levothyroxine, olmesartan, primidon

## 2021-06-29 NOTE — Patient Instructions (Signed)

## 2021-07-02 ENCOUNTER — Other Ambulatory Visit: Payer: Self-pay

## 2021-07-02 ENCOUNTER — Ambulatory Visit
Admission: RE | Admit: 2021-07-02 | Discharge: 2021-07-02 | Disposition: A | Discharge status: Home / Self Care | Payer: PPO | Source: Ambulatory Visit | Attending: Neurology | Admitting: Neurology

## 2021-07-02 DIAGNOSIS — J341 Cyst and mucocele of nose and nasal sinus: Secondary | ICD-10-CM | POA: Diagnosis not present

## 2021-07-02 DIAGNOSIS — J329 Chronic sinusitis, unspecified: Secondary | ICD-10-CM | POA: Diagnosis not present

## 2021-07-02 DIAGNOSIS — R251 Tremor, unspecified: Secondary | ICD-10-CM

## 2021-07-02 DIAGNOSIS — G250 Essential tremor: Secondary | ICD-10-CM | POA: Diagnosis not present

## 2021-07-04 ENCOUNTER — Encounter: Payer: Self-pay | Admitting: Physician Assistant

## 2021-07-04 ENCOUNTER — Telehealth: Payer: Self-pay | Admitting: Neurology

## 2021-07-04 NOTE — Telephone Encounter (Signed)
Patient called with questions about when to take his primidone since he is up to two a day. ?

## 2021-07-05 NOTE — Telephone Encounter (Signed)
Called patient back and gave Dr. Arturo Morton recommendations  ?

## 2021-08-05 ENCOUNTER — Other Ambulatory Visit: Payer: Self-pay | Admitting: Internal Medicine

## 2021-08-05 DIAGNOSIS — E039 Hypothyroidism, unspecified: Secondary | ICD-10-CM

## 2021-08-07 ENCOUNTER — Other Ambulatory Visit: Payer: Self-pay | Admitting: Internal Medicine

## 2021-08-07 DIAGNOSIS — I10 Essential (primary) hypertension: Secondary | ICD-10-CM

## 2021-08-19 ENCOUNTER — Telehealth: Payer: Self-pay | Admitting: Neurology

## 2021-08-19 NOTE — Telephone Encounter (Signed)
Patient called back, stated he needs a call about his primidone. It doesn't always help him. Would like to know what he can do ?

## 2021-08-19 NOTE — Telephone Encounter (Signed)
Called patient back to find out more information. Patient said symptoms include more wearing off. He will take his Primidone and feel fine for awhile and then the tremors come back before he ready for his next dose. Patient plays guitar for assisted living facilities and he has been trying to take a half of a Trazodone before he plays and that does help but he doesn't want to continue to do that if there is a way to get his tremors more under control ?

## 2021-08-19 NOTE — Telephone Encounter (Signed)
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  He needs a copy of the disc for when he comes in August.  He stated he called the imaging office but keeps getting the run around. ?

## 2021-08-22 ENCOUNTER — Other Ambulatory Visit: Payer: Self-pay

## 2021-08-22 DIAGNOSIS — R251 Tremor, unspecified: Secondary | ICD-10-CM

## 2021-08-22 MED ORDER — PRIMIDONE 50 MG PO TABS: 50.0000 mg | ORAL_TABLET | Freq: Three times a day (TID) | ORAL | 1 refills | Status: DC

## 2021-08-22 NOTE — Telephone Encounter (Signed)
Called Pateint and explained change also changed in patients chart to show new dose ?

## 2021-09-09 ENCOUNTER — Telehealth: Payer: Self-pay | Admitting: Neurology

## 2021-09-09 NOTE — Telephone Encounter (Signed)
Pt called in and left a message. He was told to call back in and give an update on how upping his prescription was working. He says it's working better, but he still has jitters fairly often.

## 2021-09-14 ENCOUNTER — Telehealth: Payer: Self-pay | Admitting: Neurology

## 2021-09-14 NOTE — Telephone Encounter (Signed)
Pt called in wanting to give an update on his primidone after increasing it. He has not seen much of a difference after increasing it.

## 2021-09-15 ENCOUNTER — Other Ambulatory Visit: Payer: Self-pay

## 2021-09-15 DIAGNOSIS — R251 Tremor, unspecified: Secondary | ICD-10-CM

## 2021-09-15 MED ORDER — PRIMIDONE 50 MG PO TABS: ORAL_TABLET | ORAL | 1 refills | Status: DC

## 2021-09-15 NOTE — Telephone Encounter (Signed)
Called patient and he is going to try the 1/1/2 for Primidone and see if its helps with tremors

## 2021-10-16 ENCOUNTER — Other Ambulatory Visit: Payer: Self-pay | Admitting: Internal Medicine

## 2021-10-16 DIAGNOSIS — E782 Mixed hyperlipidemia: Secondary | ICD-10-CM

## 2021-10-18 DIAGNOSIS — J310 Chronic rhinitis: Secondary | ICD-10-CM | POA: Diagnosis not present

## 2021-10-18 DIAGNOSIS — J343 Hypertrophy of nasal turbinates: Secondary | ICD-10-CM | POA: Diagnosis not present

## 2021-10-18 DIAGNOSIS — J342 Deviated nasal septum: Secondary | ICD-10-CM | POA: Diagnosis not present

## 2021-10-18 DIAGNOSIS — R0982 Postnasal drip: Secondary | ICD-10-CM | POA: Diagnosis not present

## 2021-10-20 ENCOUNTER — Other Ambulatory Visit: Payer: Self-pay | Admitting: Neurology

## 2021-10-20 ENCOUNTER — Telehealth: Payer: Self-pay

## 2021-10-20 ENCOUNTER — Telehealth: Payer: Self-pay | Admitting: Neurology

## 2021-10-20 DIAGNOSIS — R251 Tremor, unspecified: Secondary | ICD-10-CM

## 2021-10-20 MED ORDER — PRIMIDONE 50 MG PO TABS: ORAL_TABLET | ORAL | 1 refills | Status: DC

## 2021-10-20 NOTE — Telephone Encounter (Signed)
Called Pt back and informed him that he has a refill for his med to call back to office if needed.

## 2021-10-20 NOTE — Telephone Encounter (Signed)
Pt called in stating since the increased dosage on his primidone, he is going to run out. He goes out of town on Monday and doesn't want to run out.   He also was told to call us to give an update on how the dosage is doing. He says the increased dosage is working. He says it is not perfect. He says he still has some days he shakes more than others. He has also noticed if he doesn't get enough sleep its worse.

## 2021-10-20 NOTE — Telephone Encounter (Signed)
Pharmacy called and the script was old. The new order we had on file was not printed so I resend the script.

## 2021-10-21 NOTE — Telephone Encounter (Signed)
Called pt and read answer per Dr. Carles Collet. He was very pleasantly kind for it.

## 2021-11-01 ENCOUNTER — Other Ambulatory Visit: Payer: Self-pay | Admitting: Internal Medicine

## 2021-11-01 DIAGNOSIS — I10 Essential (primary) hypertension: Secondary | ICD-10-CM

## 2021-11-01 DIAGNOSIS — E039 Hypothyroidism, unspecified: Secondary | ICD-10-CM

## 2021-11-02 ENCOUNTER — Other Ambulatory Visit: Payer: Self-pay | Admitting: Internal Medicine

## 2021-11-02 DIAGNOSIS — I10 Essential (primary) hypertension: Secondary | ICD-10-CM

## 2021-12-06 NOTE — Progress Notes (Unsigned)
Assessment/Plan:    1.  Essential Tremor  -Increase, 50 mg, 2 in the morning, 1 at noon, 2 at bedtime  -Patient on metoprolol already.  He can discuss with primary care whether or not appropriate to change to propranolol.  He forgot to do that last visit.  He asked me to write down name and I did that   Subjective:   Jesse Bell was seen today in follow up for tremor.  MRI brain completed in March, after our last visit.  This was unremarkable intracranially.  He was started on primidone and after several emails since last visit, that dosage has been increased with time.  He is currently on a total of 200 mg daily.  No troubles tolerating the medication.  States that some days he is really good and other days he is not.  Outside stressors will bring on tremor as will lack of sleep.  Overall, he thinks that the medication has been helpful.  He states today that tremor has been worse today specifically as he found out that he will likely have to travel to [facility] as his wife's brother is in the hospital.  Current prescribed movement disorder medications: Primidone, 50 mg, 1 in the morning, 1 at noon and 2 at bedtime    ALLERGIES:   Allergies  Allergen Reactions   Lisinopril Cough    COUGH    CURRENT MEDICATIONS:  Current Meds  Medication Sig   albuterol (VENTOLIN HFA) 108 (90 Base) MCG/ACT inhaler Inhale 2 puffs into the lungs every 6 (six) hours as needed for wheezing or shortness of breath (cough, shortness of breath or wheezing.).   etodolac (LODINE) 400 MG tablet TAKE 1 TABLET(400 MG) BY MOUTH DAILY   ipratropium (ATROVENT) 0.03 % nasal spray Place 2 sprays into both nostrils 2 (two) times daily as needed for rhinitis.   levothyroxine (SYNTHROID) 25 MCG tablet TAKE 1 TABLET(25 MCG) BY MOUTH DAILY   metoprolol succinate (TOPROL-XL) 50 MG 24 hr tablet TAKE 1 TABLET(50 MG) BY MOUTH DAILY WITH OR IMMEDIATELY FOLLOWING A MEAL   olmesartan (BENICAR) 20 MG tablet TAKE 1  TABLET(20 MG) BY MOUTH DAILY   primidone (MYSOLINE) 50 MG tablet Take 1 50 mg in morning,  Take 1 50 mg in afternoon and Take 2 50 mg at night   rosuvastatin (CRESTOR) 40 MG tablet TAKE 1 TABLET(40 MG) BY MOUTH DAILY   traZODone (DESYREL) 50 MG tablet TAKE 1/2 TO 3 TABLETS(25 TO 150 MG) BY MOUTH AT BEDTIME AS NEEDED FOR SLEEP      Objective:    PHYSICAL EXAMINATION:    VITALS:   Vitals:   12/08/21 1458  BP: 122/75  Pulse: 91  SpO2: 97%  Weight: 211 lb (95.7 kg)  Height: '5\' 9"'$  (1.753 m)    GEN:  The patient appears stated age and is in NAD. HEENT:  Normocephalic, atraumatic.  The mucous membranes are moist. The superficial temporal arteries are without ropiness or tenderness. CV:  RRR Lungs:  CTAB Neck/HEME:  There are no carotid bruits bilaterally.  Neurological examination:  Orientation: The patient is alert and oriented x3. Cranial nerves: There is good facial symmetry. The speech is fluent and clear. Soft palate rises symmetrically and there is no tongue deviation. Hearing is intact to conversational tone. Sensation: Sensation is intact to light touch throughout Motor: Strength is at least antigravity x4.  Movement examination: Tone: There is normal tone in the bilateral upper extremities.  The tone in  the lower extremities is normal.  Abnormal movements: No rest tremor.  There is postural tremor, right more so than left.   this is stable.  He has intention tremor, moderate bilaterally.  He does have trouble with Archimedes spirals Coordination:  There is no decremation with RAM's, with any form of RAMS, including alternating supination and pronation of the forearm, hand opening and closing, finger taps, heel taps and toe taps. Gait and Station: The patient has no difficulty arising out of a deep-seated chair without the use of the hands. The patient's stride length is good with antalgic gait because 1 leg is shorter than the other (uses a cane as well).  This is stable  from prior visit. I have reviewed and interpreted the following labs independently   Chemistry      Component Value Date/Time   NA 136 06/29/2021 1327   NA 139 05/14/2019 1412   K 4.5 06/29/2021 1327   CL 100 06/29/2021 1327   CO2 28 06/29/2021 1327   BUN 17 06/29/2021 1327   BUN 14 05/14/2019 1412   CREATININE 0.79 06/29/2021 1327   CREATININE 0.87 02/25/2016 1227      Component Value Date/Time   CALCIUM 9.0 06/29/2021 1327   ALKPHOS 42 08/09/2020 1016   AST 27 08/09/2020 1016   ALT 15 08/09/2020 1016   BILITOT 0.4 08/09/2020 1016   BILITOT 0.4 05/14/2019 1412      Lab Results  Component Value Date   WBC 7.1 06/29/2021   HGB 13.0 06/29/2021   HCT 39.4 06/29/2021   MCV 87.1 06/29/2021   PLT 316.0 06/29/2021   Lab Results  Component Value Date   TSH 1.56 06/29/2021     Chemistry      Component Value Date/Time   NA 136 06/29/2021 1327   NA 139 05/14/2019 1412   K 4.5 06/29/2021 1327   CL 100 06/29/2021 1327   CO2 28 06/29/2021 1327   BUN 17 06/29/2021 1327   BUN 14 05/14/2019 1412   CREATININE 0.79 06/29/2021 1327   CREATININE 0.87 02/25/2016 1227      Component Value Date/Time   CALCIUM 9.0 06/29/2021 1327   ALKPHOS 42 08/09/2020 1016   AST 27 08/09/2020 1016   ALT 15 08/09/2020 1016   BILITOT 0.4 08/09/2020 1016   BILITOT 0.4 05/14/2019 1412         Total time spent on today's visit was 70mnutes, including both face-to-face time and nonface-to-face time.  Time included that spent on review of records (prior notes available to me/labs/imaging if pertinent), discussing treatment and goals, answering patient's questions and coordinating care.  Cc:  JJanith Lima MD

## 2021-12-08 ENCOUNTER — Ambulatory Visit: Payer: PPO | Admitting: Neurology

## 2021-12-08 ENCOUNTER — Encounter: Payer: Self-pay | Admitting: Neurology

## 2021-12-08 DIAGNOSIS — R251 Tremor, unspecified: Secondary | ICD-10-CM | POA: Diagnosis not present

## 2021-12-08 MED ORDER — PRIMIDONE 50 MG PO TABS: ORAL_TABLET | ORAL | 1 refills | Status: DC

## 2021-12-08 NOTE — Patient Instructions (Signed)
Increase primidone 50 mg, 2 in the AM, 1 in the afternoon, 2 in the evening  Ask Dr. Ronnald Ramp if it would be appropriate to change the metoprolol to propranolol for tremor and for BP control

## 2021-12-09 ENCOUNTER — Telehealth (INDEPENDENT_AMBULATORY_CARE_PROVIDER_SITE_OTHER): Payer: PPO | Admitting: Family

## 2021-12-09 ENCOUNTER — Encounter: Payer: Self-pay | Admitting: Family

## 2021-12-09 ENCOUNTER — Telehealth: Payer: Self-pay | Admitting: Neurology

## 2021-12-09 VITALS — Ht 69.0 in | Wt 210.0 lb

## 2021-12-09 DIAGNOSIS — U071 COVID-19: Secondary | ICD-10-CM | POA: Diagnosis not present

## 2021-12-09 MED ORDER — MOLNUPIRAVIR EUA 200MG CAPSULE: 4.0000 | ORAL_CAPSULE | Freq: Two times a day (BID) | ORAL | 0 refills | Status: AC

## 2021-12-09 NOTE — Progress Notes (Signed)
MyChart Video Visit    Virtual Visit via Video Note   This format is felt to be most appropriate for this patient at this time. Physical exam was limited by quality of the video and audio technology used for the visit. CMA was able to get the patient set up on a video visit.  Patient location: Home. Patient and provider in visit Provider location: Office  I discussed the limitations of evaluation and management by telemedicine and the availability of in person appointments. The patient expressed understanding and agreed to proceed.  Visit Date: 12/09/2021  Today's healthcare provider: Jeanie Sewer, NP     Subjective:   Patient ID: Jesse Bell, male    DOB: 11/17/1960, 61 y.o.   MRN: [id number]  Chief Complaint  Patient presents with   Covid Positive    Covid positive today, symptoms include congestion, Cough(clear to yellow mucus), chills, fatigue, headaches. Has tried allergy decongestant and tylenol which did help some.     HPI Upper Respiratory Infection: Symptoms include  cough with light yellow mucus, headache, nasal congestion, chills and fatigue .  Onset of symptoms was 1 day ago, gradually worsening since that time. He is drinking moderate amounts of fluids. Evaluation to date: none.  Treatment to date:  Tylenol .  Assessment & Plan:   Problem List Items Addressed This Visit   None Visit Diagnoses     COVID-19    -  Primary Sending Molnupiravir, pt advised of FDA label for emergency use, how to take, & SE. Advised of CDC guidelines for masking if out in public. OK to continue taking OTC sinus or pain meds. Encouraged to monitor & notify office of any worsening symptoms: increased shortness of breath, weakness, and signs of dehydration. Instructed to rest and hydrate well.        Past Medical History:  Diagnosis Date   Allergy    Back pain    Hyperlipidemia    Hypertension    Hypertriglyceridemia     Past Surgical History:  Procedure  Laterality Date   FRACTURE SURGERY     x6   KNEE SURGERY      Outpatient Medications Prior to Visit  Medication Sig Dispense Refill   albuterol (VENTOLIN HFA) 108 (90 Base) MCG/ACT inhaler Inhale 2 puffs into the lungs every 6 (six) hours as needed for wheezing or shortness of breath (cough, shortness of breath or wheezing.). 18 g 2   etodolac (LODINE) 400 MG tablet TAKE 1 TABLET(400 MG) BY MOUTH DAILY 90 tablet 0   ipratropium (ATROVENT) 0.03 % nasal spray Place 2 sprays into both nostrils 2 (two) times daily as needed for rhinitis. 90 mL 1   levothyroxine (SYNTHROID) 25 MCG tablet TAKE 1 TABLET(25 MCG) BY MOUTH DAILY 90 tablet 0   metoprolol succinate (TOPROL-XL) 50 MG 24 hr tablet TAKE 1 TABLET(50 MG) BY MOUTH DAILY WITH OR IMMEDIATELY FOLLOWING A MEAL 90 tablet 1   olmesartan (BENICAR) 20 MG tablet TAKE 1 TABLET(20 MG) BY MOUTH DAILY 90 tablet 0   primidone (MYSOLINE) 50 MG tablet 2 in the AM, 1 in the afternoon, 2 in the evening 450 tablet 1   rosuvastatin (CRESTOR) 40 MG tablet TAKE 1 TABLET(40 MG) BY MOUTH DAILY 90 tablet 1   traZODone (DESYREL) 50 MG tablet TAKE 1/2 TO 3 TABLETS(25 TO 150 MG) BY MOUTH AT BEDTIME AS NEEDED FOR SLEEP 90 tablet 1   No facility-administered medications prior to visit.    Allergies  Allergen  Reactions   Lisinopril Cough    COUGH       Objective:   Physical Exam Vitals and nursing note reviewed.  Constitutional:      General: She is not in acute distress.    Appearance: Normal appearance.  HENT:     Head: Normocephalic.  Pulmonary:     Effort: No respiratory distress.  Musculoskeletal:     Cervical back: Normal range of motion.  Skin:    General: Skin is dry.     Coloration: Skin is not pale.  Neurological:     Mental Status: She is alert and oriented to person, place, and time.  Psychiatric:        Mood and Affect: Mood normal.   Ht '5\' 9"'$  (1.753 m)   Wt 210 lb (95.3 kg)   BMI 31.01 kg/m   Wt Readings from Last 3 Encounters:   12/09/21 210 lb (95.3 kg)  12/08/21 211 lb (95.7 kg)  06/29/21 212 lb (96.2 kg)       I discussed the assessment and treatment plan with the patient. The patient was provided an opportunity to ask questions and all were answered. The patient agreed with the plan and demonstrated an understanding of the instructions.   The patient was advised to call back or seek an in-person evaluation if the symptoms worsen or if the condition fails to improve as anticipated.  Jeanie Sewer, NP [place] [phone] (phone) [phone] (fax)  [place]

## 2021-12-09 NOTE — Telephone Encounter (Signed)
The following message was left with Access[employer]urse on 12/08/21 at 12:39 PM.  Caller states that he has Covid.   He was in the office yesterday and needs the office to be aware. Declined triage.

## 2021-12-14 ENCOUNTER — Telehealth: Payer: Self-pay

## 2021-12-14 NOTE — Telephone Encounter (Signed)
Pt had VV for COVID on  12/09/21.  Pt calling to report that he has taken  molnupiravir EUA (LAGEVRIO) 200 mg CAPS capsule. But discovered while on the call that he has been taking them incorrect. Pt states that he has been taking only one  CAP BID. I read the pt the instructions where S. Hudnell, NP states to take 4 CAPS BID. Pt agreed to finished taking this as prescribed. Pt states that he is still not feeling well at all. Still having headaches, very congested, runny nose, body aches.  Almost Dr. Carles Collet has advised the pt to discuss whether or not appropriate to change to propranolol from metoprolol due to tremors.  Please advise

## 2022-01-19 ENCOUNTER — Telehealth: Payer: Self-pay | Admitting: Internal Medicine

## 2022-01-19 ENCOUNTER — Other Ambulatory Visit: Payer: Self-pay | Admitting: Internal Medicine

## 2022-01-19 DIAGNOSIS — E039 Hypothyroidism, unspecified: Secondary | ICD-10-CM

## 2022-01-19 MED ORDER — LEVOTHYROXINE SODIUM 25 MCG PO TABS: ORAL_TABLET | ORAL | 0 refills | Status: DC

## 2022-01-19 NOTE — Telephone Encounter (Signed)
Patient needs his levothyroxine 25 mcg - please send to [facility] [address] - Patient states he has 3 pills left.

## 2022-01-25 ENCOUNTER — Other Ambulatory Visit: Payer: Self-pay | Admitting: Internal Medicine

## 2022-01-25 ENCOUNTER — Telehealth: Payer: Self-pay

## 2022-01-25 DIAGNOSIS — E785 Hyperlipidemia, unspecified: Secondary | ICD-10-CM

## 2022-01-25 DIAGNOSIS — M79642 Pain in left hand: Secondary | ICD-10-CM | POA: Insufficient documentation

## 2022-01-25 DIAGNOSIS — E782 Mixed hyperlipidemia: Secondary | ICD-10-CM

## 2022-01-25 MED ORDER — ROSUVASTATIN CALCIUM 40 MG PO TABS: 40.0000 mg | ORAL_TABLET | Freq: Every day | ORAL | 0 refills | Status: DC

## 2022-01-25 NOTE — Telephone Encounter (Signed)
MEDICATION: rosuvastatin (CRESTOR) 40 MG tablet  PHARMACY: [id number] [place] [address] Comments: Patient has an upcoming appt on 11/22  **Let patient know to contact pharmacy at the end of the day to make sure medication is ready. **  ** Please notify patient to allow 48-72 hours to process**  **Encourage patient to contact the pharmacy for refills or they can request refills through [facility]*

## 2022-01-26 DIAGNOSIS — M79642 Pain in left hand: Secondary | ICD-10-CM | POA: Diagnosis not present

## 2022-01-26 DIAGNOSIS — M79644 Pain in right finger(s): Secondary | ICD-10-CM | POA: Diagnosis not present

## 2022-01-26 DIAGNOSIS — M20011 Mallet finger of right finger(s): Secondary | ICD-10-CM | POA: Diagnosis not present

## 2022-01-26 DIAGNOSIS — M79641 Pain in right hand: Secondary | ICD-10-CM | POA: Diagnosis not present

## 2022-02-14 ENCOUNTER — Telehealth: Payer: Self-pay | Admitting: Internal Medicine

## 2022-02-14 NOTE — Telephone Encounter (Signed)
Patient needs his olmesartan refilled - [facility] to [facility] [address]  Next appt:  03/01/2022  Last appt:  06/29/2021

## 2022-02-15 ENCOUNTER — Other Ambulatory Visit: Payer: Self-pay | Admitting: Internal Medicine

## 2022-02-15 DIAGNOSIS — I10 Essential (primary) hypertension: Secondary | ICD-10-CM

## 2022-02-15 MED ORDER — OLMESARTAN MEDOXOMIL 20 MG PO TABS: 20.0000 mg | ORAL_TABLET | Freq: Every day | ORAL | 0 refills | Status: DC

## 2022-02-15 NOTE — Telephone Encounter (Signed)
Patient is completely out of this medication - he has an appointment scheduled for 03/01/2022

## 2022-02-16 DIAGNOSIS — M20011 Mallet finger of right finger(s): Secondary | ICD-10-CM | POA: Diagnosis not present

## 2022-02-25 ENCOUNTER — Other Ambulatory Visit: Payer: Self-pay | Admitting: Internal Medicine

## 2022-02-25 DIAGNOSIS — E039 Hypothyroidism, unspecified: Secondary | ICD-10-CM

## 2022-02-27 ENCOUNTER — Other Ambulatory Visit: Payer: Self-pay | Admitting: Internal Medicine

## 2022-02-27 DIAGNOSIS — E039 Hypothyroidism, unspecified: Secondary | ICD-10-CM

## 2022-03-01 ENCOUNTER — Ambulatory Visit (INDEPENDENT_AMBULATORY_CARE_PROVIDER_SITE_OTHER): Payer: PPO

## 2022-03-01 ENCOUNTER — Ambulatory Visit (INDEPENDENT_AMBULATORY_CARE_PROVIDER_SITE_OTHER): Payer: PPO | Admitting: Internal Medicine

## 2022-03-01 ENCOUNTER — Encounter: Payer: Self-pay | Admitting: Internal Medicine

## 2022-03-01 VITALS — BP 138/82 | HR 80 | Temp 98.0°F | Resp 16 | Ht 69.0 in | Wt 210.0 lb

## 2022-03-01 DIAGNOSIS — R058 Other specified cough: Secondary | ICD-10-CM | POA: Diagnosis not present

## 2022-03-01 DIAGNOSIS — I10 Essential (primary) hypertension: Secondary | ICD-10-CM

## 2022-03-01 DIAGNOSIS — E785 Hyperlipidemia, unspecified: Secondary | ICD-10-CM

## 2022-03-01 DIAGNOSIS — J22 Unspecified acute lower respiratory infection: Secondary | ICD-10-CM | POA: Diagnosis not present

## 2022-03-01 DIAGNOSIS — Z0001 Encounter for general adult medical examination with abnormal findings: Secondary | ICD-10-CM | POA: Diagnosis not present

## 2022-03-01 DIAGNOSIS — R251 Tremor, unspecified: Secondary | ICD-10-CM

## 2022-03-01 DIAGNOSIS — J4520 Mild intermittent asthma, uncomplicated: Secondary | ICD-10-CM | POA: Diagnosis not present

## 2022-03-01 DIAGNOSIS — E039 Hypothyroidism, unspecified: Secondary | ICD-10-CM

## 2022-03-01 DIAGNOSIS — G969 Disorder of central nervous system, unspecified: Secondary | ICD-10-CM

## 2022-03-01 DIAGNOSIS — Z125 Encounter for screening for malignant neoplasm of prostate: Secondary | ICD-10-CM | POA: Diagnosis not present

## 2022-03-01 DIAGNOSIS — R059 Cough, unspecified: Secondary | ICD-10-CM | POA: Diagnosis not present

## 2022-03-01 LAB — BASIC METABOLIC PANEL
BUN: 15 mg/dL (ref 6–23)
CO2: 30 mEq/L (ref 19–32)
Calcium: 8.9 mg/dL (ref 8.4–10.5)
Chloride: 100 mEq/L (ref 96–112)
Creatinine, Ser: 0.77 mg/dL (ref 0.40–1.50)
GFR: 96.33 mL/min (ref >60.00)
Glucose, Bld: 96 mg/dL (ref 70–99)
Potassium: 4.7 mEq/L (ref 3.5–5.1)
Sodium: 136 mEq/L (ref 135–145)

## 2022-03-01 LAB — LIPID PANEL
Cholesterol: 194 mg/dL (ref 0–200)
HDL: 68.5 mg/dL (ref >39.00)
NonHDL: 125.54
Total CHOL/HDL Ratio: 3
Triglycerides: 202 mg/dL — ABNORMAL HIGH (ref 0.0–149.0)
VLDL: 40.4 mg/dL — ABNORMAL HIGH (ref 0.0–40.0)

## 2022-03-01 LAB — LDL CHOLESTEROL, DIRECT: Direct LDL: 104 mg/dL

## 2022-03-01 LAB — PSA: PSA: 0.08 ng/mL — ABNORMAL LOW (ref 0.10–4.00)

## 2022-03-01 LAB — TSH: TSH: 2.47 u[IU]/mL (ref 0.35–5.50)

## 2022-03-01 MED ORDER — ALBUTEROL SULFATE HFA 108 (90 BASE) MCG/ACT IN AERS: 2.0000 | INHALATION_SPRAY | Freq: Four times a day (QID) | RESPIRATORY_TRACT | 3 refills | Status: DC | PRN

## 2022-03-01 MED ORDER — LEVOTHYROXINE SODIUM 25 MCG PO TABS: ORAL_TABLET | ORAL | 1 refills | Status: DC

## 2022-03-01 MED ORDER — CEFDINIR 300 MG PO CAPS: 300.0000 mg | ORAL_CAPSULE | Freq: Two times a day (BID) | ORAL | 0 refills | Status: AC

## 2022-03-01 MED ORDER — PROPRANOLOL HCL ER 60 MG PO CP24: 60.0000 mg | ORAL_CAPSULE | Freq: Every day | ORAL | 0 refills | Status: DC

## 2022-03-01 MED ORDER — TRELEGY ELLIPTA 100-62.5-25 MCG/ACT IN AEPB: 1.0000 | INHALATION_SPRAY | Freq: Every day | RESPIRATORY_TRACT | 0 refills | Status: DC

## 2022-03-01 NOTE — Patient Instructions (Signed)
Health Maintenance, Male Adopting a healthy lifestyle and getting preventive care are important in promoting health and wellness. Ask your health care provider about: The right schedule for you to have regular tests and exams. Things you can do on your own to prevent diseases and keep yourself healthy. What should I know about diet, weight, and exercise? Eat a healthy diet  Eat a diet that includes plenty of vegetables, fruits, low-fat dairy products, and lean protein. Do not eat a lot of foods that are high in solid fats, added sugars, or sodium. Maintain a healthy weight Body mass index (BMI) is a measurement that can be used to identify possible weight problems. It estimates body fat based on height and weight. Your health care provider can help determine your BMI and help you achieve or maintain a healthy weight. Get regular exercise Get regular exercise. This is one of the most important things you can do for your health. Most adults should: Exercise for at least 150 minutes each week. The exercise should increase your heart rate and make you sweat (moderate-intensity exercise). Do strengthening exercises at least twice a week. This is in addition to the moderate-intensity exercise. Spend less time sitting. Even light physical activity can be beneficial. Watch cholesterol and blood lipids Have your blood tested for lipids and cholesterol at 62 years of age, then have this test every 5 years. You may need to have your cholesterol levels checked more often if: Your lipid or cholesterol levels are high. You are older than 62 years of age. You are at high risk for heart disease. What should I know about cancer screening? Many types of cancers can be detected early and may often be prevented. Depending on your health history and family history, you may need to have cancer screening at various ages. This may include screening for: Colorectal cancer. Prostate cancer. Skin cancer. Lung  cancer. What should I know about heart disease, diabetes, and high blood pressure? Blood pressure and heart disease High blood pressure causes heart disease and increases the risk of stroke. This is more likely to develop in people who have high blood pressure readings or are overweight. Talk with your health care provider about your target blood pressure readings. Have your blood pressure checked: Every 3-5 years if you are 18-39 years of age. Every year if you are 40 years old or older. If you are between the ages of 65 and 75 and are a current or former smoker, ask your health care provider if you should have a one-time screening for abdominal aortic aneurysm (AAA). Diabetes Have regular diabetes screenings. This checks your fasting blood sugar level. Have the screening done: Once every three years after age 45 if you are at a normal weight and have a low risk for diabetes. More often and at a younger age if you are overweight or have a high risk for diabetes. What should I know about preventing infection? Hepatitis B If you have a higher risk for hepatitis B, you should be screened for this virus. Talk with your health care provider to find out if you are at risk for hepatitis B infection. Hepatitis C Blood testing is recommended for: Everyone born from 1945 through 1965. Anyone with known risk factors for hepatitis C. Sexually transmitted infections (STIs) You should be screened each year for STIs, including gonorrhea and chlamydia, if: You are sexually active and are younger than 62 years of age. You are older than 62 years of age and your   health care provider tells you that you are at risk for this type of infection. Your sexual activity has changed since you were last screened, and you are at increased risk for chlamydia or gonorrhea. Ask your health care provider if you are at risk. Ask your health care provider about whether you are at high risk for HIV. Your health care provider  may recommend a prescription medicine to help prevent HIV infection. If you choose to take medicine to prevent HIV, you should first get tested for HIV. You should then be tested every 3 months for as long as you are taking the medicine. Follow these instructions at home: Alcohol use Do not drink alcohol if your health care provider tells you not to drink. If you drink alcohol: Limit how much you have to 0-2 drinks a day. Know how much alcohol is in your drink. In the [place] one drink equals one 12 oz bottle of beer (355 mL), one 5 oz glass of wine (148 mL), or one 1 oz glass of hard liquor (44 mL). Lifestyle Do not use any products that contain nicotine or tobacco. These products include cigarettes, chewing tobacco, and vaping devices, such as e-cigarettes. If you need help quitting, ask your health care provider. Do not use street drugs. Do not share needles. Ask your health care provider for help if you need support or information about quitting drugs. General instructions Schedule regular health, dental, and eye exams. Stay current with your vaccines. Tell your health care provider if: You often feel depressed. You have ever been abused or do not feel safe at home. Summary Adopting a healthy lifestyle and getting preventive care are important in promoting health and wellness. Follow your health care provider's instructions about healthy diet, exercising, and getting tested or screened for diseases. Follow your health care provider's instructions on monitoring your cholesterol and blood pressure. This information is not intended to replace advice given to you by your health care provider. Make sure you discuss any questions you have with your health care provider. Document Revised: 08/16/2020 Document Reviewed: 08/16/2020 Elsevier Patient Education  2023 [employer] Inc[employer]  

## 2022-03-01 NOTE — Progress Notes (Signed)
Subjective:  Patient ID: Jesse Bell, male    DOB: 1960-12-22  Age: 61 y.o. MRN: [id number]  CC: Hyperlipidemia, Hypertension, Hypothyroidism, and Annual Exam   HPI YESENIA FONTENETTE presents for a CPX and f/up -  He complains of a 2-week history of increased use of albuterol and cough productive of thick yellow phlegm.  He denies chest pain, diaphoresis, fever, chills, or night sweats.  His neurologist has asked that metoprolol be changed to propranolol to help with tremors.  Outpatient Medications Prior to Visit  Medication Sig Dispense Refill   ipratropium (ATROVENT) 0.03 % nasal spray Place 2 sprays into both nostrils 2 (two) times daily as needed for rhinitis. 90 mL 1   olmesartan (BENICAR) 20 MG tablet Take 1 tablet (20 mg total) by mouth daily. 90 tablet 0   primidone (MYSOLINE) 50 MG tablet 2 in the AM, 1 in the afternoon, 2 in the evening 450 tablet 1   rosuvastatin (CRESTOR) 40 MG tablet Take 1 tablet (40 mg total) by mouth daily. 90 tablet 0   traZODone (DESYREL) 50 MG tablet TAKE 1/2 TO 3 TABLETS(25 TO 150 MG) BY MOUTH AT BEDTIME AS NEEDED FOR SLEEP 90 tablet 1   albuterol (VENTOLIN HFA) 108 (90 Base) MCG/ACT inhaler Inhale 2 puffs into the lungs every 6 (six) hours as needed for wheezing or shortness of breath (cough, shortness of breath or wheezing.). 18 g 2   etodolac (LODINE) 400 MG tablet TAKE 1 TABLET(400 MG) BY MOUTH DAILY 90 tablet 0   levothyroxine (SYNTHROID) 25 MCG tablet TAKE 1 TABLET(25 MCG) BY MOUTH DAILY 30 tablet 0   metoprolol succinate (TOPROL-XL) 50 MG 24 hr tablet TAKE 1 TABLET(50 MG) BY MOUTH DAILY WITH OR IMMEDIATELY FOLLOWING A MEAL 90 tablet 1   No facility-administered medications prior to visit.    ROS Review of Systems  Constitutional: Negative.  Negative for chills, diaphoresis, fatigue and fever.  HENT: Negative.    Eyes: Negative.   Respiratory:  Positive for cough and wheezing. Negative for chest tightness and shortness of breath.    Cardiovascular:  Negative for chest pain, palpitations and leg swelling.  Gastrointestinal:  Negative for abdominal pain, diarrhea, nausea and vomiting.  Endocrine: Negative.   Genitourinary: Negative.  Negative for difficulty urinating.  Musculoskeletal: Negative.  Negative for arthralgias and myalgias.  Skin: Negative.   Neurological:  Positive for tremors. Negative for dizziness and weakness.  Hematological:  Negative for adenopathy. Does not bruise/bleed easily.  Psychiatric/Behavioral: Negative.      Objective:  BP 138/82 (BP Location: Left Arm, Patient Position: Sitting, Cuff Size: Large)   Pulse 80   Temp 98 F (36.7 C) (Oral)   Resp 16   Ht '5\' 9"'$  (1.753 m)   Wt 210 lb (95.3 kg)   SpO2 95%   BMI 31.01 kg/m   BP Readings from Last 3 Encounters:  03/01/22 138/82  12/08/21 122/75  06/29/21 126/80    Wt Readings from Last 3 Encounters:  03/01/22 210 lb (95.3 kg)  12/09/21 210 lb (95.3 kg)  12/08/21 211 lb (95.7 kg)    Physical Exam Vitals reviewed.  Constitutional:      Appearance: He is not ill-appearing.  HENT:     Mouth/Throat:     Mouth: Mucous membranes are moist.  Eyes:     General: No scleral icterus.    Conjunctiva/sclera: Conjunctivae normal.  Cardiovascular:     Rate and Rhythm: Normal rate and regular rhythm.  Heart sounds: No murmur heard. Pulmonary:     Effort: Pulmonary effort is normal.     Breath sounds: No stridor. Examination of the right-lower field reveals rhonchi. Rhonchi present. No decreased breath sounds, wheezing or rales.  Abdominal:     General: Abdomen is flat. There is no distension.     Palpations: There is no mass.     Tenderness: There is no abdominal tenderness. There is no guarding.     Hernia: No hernia is present.  Genitourinary:    Comments: He was not able to position himself on the table for a GU/rectal exam Musculoskeletal:        General: Normal range of motion.     Cervical back: Neck supple.     Right  lower leg: No edema.     Left lower leg: No edema.  Lymphadenopathy:     Cervical: No cervical adenopathy.  Skin:    General: Skin is warm and dry.     Coloration: Skin is not pale.  Neurological:     General: No focal deficit present.     Mental Status: He is alert.  Psychiatric:        Mood and Affect: Mood normal.        Behavior: Behavior normal.     Lab Results  Component Value Date   WBC 7.1 06/29/2021   HGB 13.0 06/29/2021   HCT 39.4 06/29/2021   PLT 316.0 06/29/2021   GLUCOSE 96 03/01/2022   CHOL 194 03/01/2022   TRIG 202.0 (H) 03/01/2022   HDL 68.50 03/01/2022   LDLDIRECT 104.0 03/01/2022   LDLCALC 72 08/09/2020   ALT 15 08/09/2020   AST 27 08/09/2020   NA 136 03/01/2022   K 4.7 03/01/2022   CL 100 03/01/2022   CREATININE 0.77 03/01/2022   BUN 15 03/01/2022   CO2 30 03/01/2022   TSH 2.47 03/01/2022   PSA 0.08 (L) 03/01/2022    MR BRAIN WO CONTRAST  Result Date: 07/04/2021 CLINICAL DATA:  Provided history: Tremor. Additional history provided by scanning technologist: Patient reports bilateral hand tremors for several years, but worsening in the last 6 months. EXAM: MRI HEAD WITHOUT CONTRAST TECHNIQUE: Multiplanar, multiecho pulse sequences of the brain and surrounding structures were obtained without intravenous contrast. COMPARISON:  Head CT 04/02/2003. FINDINGS: Mild intermittent motion degradation. Brain: Mild generalized cerebral and cerebellar volume loss. No cortical encephalomalacia is identified. No significant cerebral white matter disease. There is no acute infarct. No evidence of an intracranial mass. No chronic intracranial blood products. No extra-axial fluid collection. No midline shift. Vascular: Maintained flow voids within the proximal large arterial vessels. Skull and upper cervical spine: No focal suspicious marrow lesion. Sinuses/Orbits: Visualized orbits show no acute finding. 3.2 cm mucous retention cyst within the left maxillary sinus. Minimal  mucosal thickening within the bilateral ethmoid sinuses. IMPRESSION: No evidence of acute intracranial abnormality. Mild generalized parenchymal volume loss. Otherwise unremarkable non-contrast MRI appearance of the brain. Paranasal sinus disease, as described. Electronically Signed   By: Kellie Simmering D.O.   On: 07/04/2021 07:44   No results found.   Assessment & Plan:   Prestin was seen today for hyperlipidemia, hypertension, hypothyroidism and annual exam.  Diagnoses and all orders for this visit:  Primary hypertension- His blood pressure is well-controlled. -     propranolol ER (INDERAL LA) 60 MG 24 hr capsule; Take 1 capsule (60 mg total) by mouth daily. -     Basic metabolic panel; Future -  Basic metabolic panel  Encounter for general adult medical examination with abnormal findings- Exam completed, labs reviewed, vaccines reviewed-the refused a flu vaccine today, cancer screenings are up-to-date, patient education was given.  Hyperlipidemia with target LDL less than 130- LDL goal achieved. Doing well on the statin  -     Lipid panel; Future -     Lipid panel  Acquired hypothyroidism- He is euthyroid. -     TSH; Future -     TSH -     levothyroxine (SYNTHROID) 25 MCG tablet; TAKE 1 TABLET(25 MCG) BY MOUTH DAILY  Tremor due to disorder of central nervous system -     propranolol ER (INDERAL LA) 60 MG 24 hr capsule; Take 1 capsule (60 mg total) by mouth daily.  Mild intermittent asthma without complication -     Fluticasone-Umeclidin-Vilant (TRELEGY ELLIPTA) 100-62.5-25 MCG/ACT AEPB; Inhale 1 puff into the lungs daily. -     albuterol (VENTOLIN HFA) 108 (90 Base) MCG/ACT inhaler; Inhale 2 puffs into the lungs every 6 (six) hours as needed for wheezing or shortness of breath (cough, shortness of breath or wheezing.).  Cough productive of purulent sputum -     DG Chest 2 View; Future  Screening for prostate cancer -     PSA; Future -     PSA  LRTI (lower respiratory tract  infection) -     cefdinir (OMNICEF) 300 MG capsule; Take 1 capsule (300 mg total) by mouth 2 (two) times daily for 7 days.  Other orders -     LDL cholesterol, direct   I have discontinued Olon A. Markin's etodolac and metoprolol succinate. I am also having him start on propranolol ER, Trelegy Ellipta, and cefdinir. Additionally, I am having him maintain his traZODone, ipratropium, primidone, rosuvastatin, olmesartan, albuterol, and levothyroxine.  Meds ordered this encounter  Medications   propranolol ER (INDERAL LA) 60 MG 24 hr capsule    Sig: Take 1 capsule (60 mg total) by mouth daily.    Dispense:  90 capsule    Refill:  0   Fluticasone-Umeclidin-Vilant (TRELEGY ELLIPTA) 100-62.5-25 MCG/ACT AEPB    Sig: Inhale 1 puff into the lungs daily.    Dispense:  120 each    Refill:  0   albuterol (VENTOLIN HFA) 108 (90 Base) MCG/ACT inhaler    Sig: Inhale 2 puffs into the lungs every 6 (six) hours as needed for wheezing or shortness of breath (cough, shortness of breath or wheezing.).    Dispense:  18 g    Refill:  3   cefdinir (OMNICEF) 300 MG capsule    Sig: Take 1 capsule (300 mg total) by mouth 2 (two) times daily for 7 days.    Dispense:  14 capsule    Refill:  0   levothyroxine (SYNTHROID) 25 MCG tablet    Sig: TAKE 1 TABLET(25 MCG) BY MOUTH DAILY    Dispense:  90 tablet    Refill:  1     Follow-up: Return in about 3 months (around 06/01/2022).  Scarlette Calico, MD

## 2022-03-10 DIAGNOSIS — M79642 Pain in left hand: Secondary | ICD-10-CM | POA: Diagnosis not present

## 2022-03-10 DIAGNOSIS — M20011 Mallet finger of right finger(s): Secondary | ICD-10-CM | POA: Diagnosis not present

## 2022-04-07 DIAGNOSIS — M79642 Pain in left hand: Secondary | ICD-10-CM | POA: Diagnosis not present

## 2022-04-07 DIAGNOSIS — M20011 Mallet finger of right finger(s): Secondary | ICD-10-CM | POA: Diagnosis not present

## 2022-04-19 ENCOUNTER — Other Ambulatory Visit: Payer: Self-pay | Admitting: Internal Medicine

## 2022-04-19 DIAGNOSIS — E785 Hyperlipidemia, unspecified: Secondary | ICD-10-CM

## 2022-04-24 ENCOUNTER — Other Ambulatory Visit: Payer: Self-pay | Admitting: Neurology

## 2022-04-24 DIAGNOSIS — R251 Tremor, unspecified: Secondary | ICD-10-CM

## 2022-05-04 ENCOUNTER — Other Ambulatory Visit: Payer: Self-pay | Admitting: Internal Medicine

## 2022-05-04 DIAGNOSIS — I10 Essential (primary) hypertension: Secondary | ICD-10-CM

## 2022-05-10 ENCOUNTER — Other Ambulatory Visit: Payer: Self-pay | Admitting: Internal Medicine

## 2022-05-10 DIAGNOSIS — I10 Essential (primary) hypertension: Secondary | ICD-10-CM

## 2022-05-13 ENCOUNTER — Telehealth: Payer: PPO | Admitting: Nurse Practitioner

## 2022-05-13 DIAGNOSIS — B9689 Other specified bacterial agents as the cause of diseases classified elsewhere: Secondary | ICD-10-CM

## 2022-05-13 DIAGNOSIS — J019 Acute sinusitis, unspecified: Secondary | ICD-10-CM

## 2022-05-13 MED ORDER — AMOXICILLIN-POT CLAVULANATE 875-125 MG PO TABS: 1.0000 | ORAL_TABLET | Freq: Two times a day (BID) | ORAL | 0 refills | Status: DC

## 2022-05-13 NOTE — Progress Notes (Signed)
Virtual Visit Consent   Jesse Bell, you are scheduled for a virtual visit with a [facility] provider today. Just as with appointments in the office, your consent must be obtained to participate. Your consent will be active for this visit and any virtual visit you may have with one of our providers in the next 365 days. If you have a MyChart account, a copy of this consent can be sent to you electronically.  As this is a virtual visit, video technology does not allow for your provider to perform a traditional examination. This may limit your provider's ability to fully assess your condition. If your provider identifies any concerns that need to be evaluated in person or the need to arrange testing (such as labs, EKG, etc.), we will make arrangements to do so. Although advances in technology are sophisticated, we cannot ensure that it will always work on either your end or our end. If the connection with a video visit is poor, the visit may have to be switched to a telephone visit. With either a video or telephone visit, we are not always able to ensure that we have a secure connection.  By engaging in this virtual visit, you consent to the provision of healthcare and authorize for your insurance to be billed (if applicable) for the services provided during this visit. Depending on your insurance coverage, you may receive a charge related to this service.  I need to obtain your verbal consent now. Are you willing to proceed with your visit today? GAEL LONDO has provided verbal consent on 05/13/2022 for a virtual visit (video or telephone). Gildardo Pounds, NP  Date: 05/13/2022 4:13 PM  Virtual Visit via Video Note   I, Gildardo Pounds, connected with  Jesse Bell  ([id number], 02-01-1961) on 05/13/22 at  4:00 PM EST by a video-enabled telemedicine application and verified that I am speaking with the correct person using two identifiers.  Location: Patient: Virtual Visit Location Patient:  Home Provider: Virtual Visit Location Provider: Home Office   I discussed the limitations of evaluation and management by telemedicine and the availability of in person appointments. The patient expressed understanding and agreed to proceed.    History of Present Illness: Jesse Bell is a 62 y.o. who identifies as a male who was assigned male at birth, and is being seen today for sinusitis.  Over the past several days Mr. Half has been experiencing similar symptoms of previously treated bacterial sinusitis including sneezing, bilateral ear pressure, nasal and sinus congestion, productive cough and fatigue.  Taking Alka-Seltzer seltzer cold plus with no improvement in symptoms and seems to be worsening over the past 2 days.    Problems:  Patient Active Problem List   Diagnosis Date Noted   Tremor due to disorder of central nervous system 03/01/2022   Cough productive of purulent sputum 03/01/2022   Screening for prostate cancer 03/01/2022   LRTI (lower respiratory tract infection) 03/01/2022   Mallet finger of right hand 01/26/2022   Acquired hypothyroidism 08/09/2020   Mild intermittent asthma without complication 04/18/3233   Epidural lipomatosis 04/29/2015   Lumbar radiculopathy 03/25/2015   Hypertension 05/03/2012   Back pain 05/03/2012    Allergies:  Allergies  Allergen Reactions   Lisinopril Cough    COUGH   Medications:  Current Outpatient Medications:    amoxicillin-clavulanate (AUGMENTIN) 875-125 MG tablet, Take 1 tablet by mouth 2 (two) times daily for 7 days., Disp: 14 tablet, Rfl: 0  albuterol (VENTOLIN HFA) 108 (90 Base) MCG/ACT inhaler, Inhale 2 puffs into the lungs every 6 (six) hours as needed for wheezing or shortness of breath (cough, shortness of breath or wheezing.)., Disp: 18 g, Rfl: 3   Fluticasone-Umeclidin-Vilant (TRELEGY ELLIPTA) 100-62.5-25 MCG/ACT AEPB, Inhale 1 puff into the lungs daily., Disp: 120 each, Rfl: 0   ipratropium (ATROVENT) 0.03 % nasal  spray, Place 2 sprays into both nostrils 2 (two) times daily as needed for rhinitis., Disp: 90 mL, Rfl: 1   levothyroxine (SYNTHROID) 25 MCG tablet, TAKE 1 TABLET(25 MCG) BY MOUTH DAILY, Disp: 90 tablet, Rfl: 1   olmesartan (BENICAR) 20 MG tablet, TAKE 1 TABLET BY MOUTH EVERY DAY, Disp: 90 tablet, Rfl: 0   primidone (MYSOLINE) 50 MG tablet, TAKE 2 TABLETS BY MOUTH IN THE MORNING AND 1TABLET BY MOUTH AFTERNOON AND TAKE 2 TABLETS BY MOUTH AT NIGHT;, Disp: 450 tablet, Rfl: 0   propranolol ER (INDERAL LA) 60 MG 24 hr capsule, Take 1 capsule (60 mg total) by mouth daily., Disp: 90 capsule, Rfl: 0   rosuvastatin (CRESTOR) 40 MG tablet, TAKE 1 TABLET BY MOUTH EVERY DAY, Disp: 90 tablet, Rfl: 0   traZODone (DESYREL) 50 MG tablet, TAKE 1/2 TO 3 TABLETS(25 TO 150 MG) BY MOUTH AT BEDTIME AS NEEDED FOR SLEEP, Disp: 90 tablet, Rfl: 1  Observations/Objective: Patient is well-developed, well-nourished in no acute distress.  Resting comfortably at home.  Head is normocephalic, atraumatic.  No labored breathing.  Speech is clear and coherent with logical content.  Patient is alert and oriented at baseline.    Assessment and Plan: 1. Acute bacterial sinusitis - amoxicillin-clavulanate (AUGMENTIN) 875-125 MG tablet; Take 1 tablet by mouth 2 (two) times daily for 7 days.  Dispense: 14 tablet; Refill: 0  INSTRUCTIONS: use a humidifier for nasal congestion Drink plenty of fluids, rest and wash hands frequently to avoid the spread of infection Alternate tylenol and Motrin for relief of fever   Follow Up Instructions: I discussed the assessment and treatment plan with the patient. The patient was provided an opportunity to ask questions and all were answered. The patient agreed with the plan and demonstrated an understanding of the instructions.  A copy of instructions were sent to the patient via MyChart unless otherwise noted below.    The patient was advised to call back or seek an in-person evaluation if  the symptoms worsen or if the condition fails to improve as anticipated.  Time:  I spent 12 minutes with the patient via telehealth technology discussing the above problems/concerns.    Gildardo Pounds, NP

## 2022-05-13 NOTE — Patient Instructions (Signed)
Jesse Bell, thank you for joining Gildardo Pounds, NP for today's virtual visit.  While this provider is not your primary care provider (PCP), if your PCP is located in our provider database this encounter information will be shared with them immediately following your visit.   [facility] account gives you access to today's visit and all your visits, tests, and labs performed at [facility] " click here if you don't have a [facility] account or go to [email or web address]  Consent: (Patient) Jesse Bell provided verbal consent for this virtual visit at the beginning of the encounter.  Current Medications:  Current Outpatient Medications:    amoxicillin-clavulanate (AUGMENTIN) 875-125 MG tablet, Take 1 tablet by mouth 2 (two) times daily for 7 days., Disp: 14 tablet, Rfl: 0   albuterol (VENTOLIN HFA) 108 (90 Base) MCG/ACT inhaler, Inhale 2 puffs into the lungs every 6 (six) hours as needed for wheezing or shortness of breath (cough, shortness of breath or wheezing.)., Disp: 18 g, Rfl: 3   Fluticasone-Umeclidin-Vilant (TRELEGY ELLIPTA) 100-62.5-25 MCG/ACT AEPB, Inhale 1 puff into the lungs daily., Disp: 120 each, Rfl: 0   ipratropium (ATROVENT) 0.03 % nasal spray, Place 2 sprays into both nostrils 2 (two) times daily as needed for rhinitis., Disp: 90 mL, Rfl: 1   levothyroxine (SYNTHROID) 25 MCG tablet, TAKE 1 TABLET(25 MCG) BY MOUTH DAILY, Disp: 90 tablet, Rfl: 1   olmesartan (BENICAR) 20 MG tablet, TAKE 1 TABLET BY MOUTH EVERY DAY, Disp: 90 tablet, Rfl: 0   primidone (MYSOLINE) 50 MG tablet, TAKE 2 TABLETS BY MOUTH IN THE MORNING AND 1TABLET BY MOUTH AFTERNOON AND TAKE 2 TABLETS BY MOUTH AT NIGHT;, Disp: 450 tablet, Rfl: 0   propranolol ER (INDERAL LA) 60 MG 24 hr capsule, Take 1 capsule (60 mg total) by mouth daily., Disp: 90 capsule, Rfl: 0   rosuvastatin (CRESTOR) 40 MG tablet, TAKE 1 TABLET BY MOUTH EVERY DAY, Disp: 90 tablet, Rfl: 0   traZODone (DESYREL)  50 MG tablet, TAKE 1/2 TO 3 TABLETS(25 TO 150 MG) BY MOUTH AT BEDTIME AS NEEDED FOR SLEEP, Disp: 90 tablet, Rfl: 1   Medications ordered in this encounter:  Meds ordered this encounter  Medications   amoxicillin-clavulanate (AUGMENTIN) 875-125 MG tablet    Sig: Take 1 tablet by mouth 2 (two) times daily for 7 days.    Dispense:  14 tablet    Refill:  0    Order Specific Question:   Supervising Provider    Answer:   Chase Picket [id number]     *If you need refills on other medications prior to your next appointment, please contact your pharmacy*  Follow-Up: Call back or seek an in-person evaluation if the symptoms worsen or if the condition fails to improve as anticipated.  [place] [phone]  Other Instructions INSTRUCTIONS: use a humidifier for nasal congestion Drink plenty of fluids, rest and wash hands frequently to avoid the spread of infection Alternate tylenol and Motrin for relief of fever    If you have been instructed to have an in-person evaluation today at a local Urgent Care facility, please use the link below. It will take you to a list of all of our available [facility], including address, phone number and hours of operation. Please do not delay care.   Urgent Cares  If you or a family member do not have a primary care provider, use the link below to schedule a  visit and establish care. When you choose a [facility] primary care physician or advanced practice provider, you gain a long-term partner in health. Find a Primary Care Provider  Learn more about [facility]'s in-office and virtual care options: [facility]

## 2022-05-18 ENCOUNTER — Telehealth: Payer: PPO | Admitting: Physician Assistant

## 2022-05-18 DIAGNOSIS — J4541 Moderate persistent asthma with (acute) exacerbation: Secondary | ICD-10-CM

## 2022-05-18 DIAGNOSIS — J019 Acute sinusitis, unspecified: Secondary | ICD-10-CM

## 2022-05-18 DIAGNOSIS — B9689 Other specified bacterial agents as the cause of diseases classified elsewhere: Secondary | ICD-10-CM

## 2022-05-18 MED ORDER — DOXYCYCLINE HYCLATE 100 MG PO TABS: 100.0000 mg | ORAL_TABLET | Freq: Two times a day (BID) | ORAL | 0 refills | Status: DC

## 2022-05-18 MED ORDER — PREDNISONE 20 MG PO TABS: 40.0000 mg | ORAL_TABLET | Freq: Every day | ORAL | 0 refills | Status: DC

## 2022-05-18 NOTE — Progress Notes (Signed)
Virtual Visit Consent   Jesse Bell, you are scheduled for a virtual visit with a [facility] provider today. Just as with appointments in the office, your consent must be obtained to participate. Your consent will be active for this visit and any virtual visit you may have with one of our providers in the next 365 days. If you have a MyChart account, a copy of this consent can be sent to you electronically.  As this is a virtual visit, video technology does not allow for your provider to perform a traditional examination. This may limit your provider's ability to fully assess your condition. If your provider identifies any concerns that need to be evaluated in person or the need to arrange testing (such as labs, EKG, etc.), we will make arrangements to do so. Although advances in technology are sophisticated, we cannot ensure that it will always work on either your end or our end. If the connection with a video visit is poor, the visit may have to be switched to a telephone visit. With either a video or telephone visit, we are not always able to ensure that we have a secure connection.  By engaging in this virtual visit, you consent to the provision of healthcare and authorize for your insurance to be billed (if applicable) for the services provided during this visit. Depending on your insurance coverage, you may receive a charge related to this service.  I need to obtain your verbal consent now. Are you willing to proceed with your visit today? Jesse Bell has provided verbal consent on 05/18/2022 for a virtual visit (video or telephone). Leeanne Rio, [place]  Date: 05/18/2022 2:28 PM  Virtual Visit via Video Note   I, Leeanne Rio, connected with  Jesse Bell  ([id number], 1960-05-28) on 05/18/22 at  1:45 PM EST by a video-enabled telemedicine application and verified that I am speaking with the correct person using two identifiers.  Location: Patient: Virtual Visit Location Patient:  Home Provider: Virtual Visit Location Provider: Home Office   I discussed the limitations of evaluation and management by telemedicine and the availability of in person appointments. The patient expressed understanding and agreed to proceed.    History of Present Illness: Jesse Bell is a 62 y.o. who identifies as a male who was assigned male at birth, and is being seen today for ongoing symptoms despit treatment with Augmentin. Was seen on 2/3 and diagnosed with bacterial sinusitis, starting Augmentin BID which he has been taking. Notes sinuses are slightly improved but noting increased chest congestion with change in sputum to green. Denies fever, chills. Notes increased chest tightness despite use of his chronic inhalers.   HPI: HPI  Problems:  Patient Active Problem List   Diagnosis Date Noted   Tremor due to disorder of central nervous system 03/01/2022   Cough productive of purulent sputum 03/01/2022   Screening for prostate cancer 03/01/2022   LRTI (lower respiratory tract infection) 03/01/2022   Mallet finger of right hand 01/26/2022   Acquired hypothyroidism 08/09/2020   Mild intermittent asthma without complication 50/53/9767   Epidural lipomatosis 04/29/2015   Lumbar radiculopathy 03/25/2015   Hypertension 05/03/2012   Back pain 05/03/2012    Allergies:  Allergies  Allergen Reactions   Lisinopril Cough    COUGH   Medications:  Current Outpatient Medications:    doxycycline (VIBRA-TABS) 100 MG tablet, Take 1 tablet (100 mg total) by mouth 2 (two) times daily., Disp: 14 tablet, Rfl: 0  predniSONE (DELTASONE) 20 MG tablet, Take 2 tablets (40 mg total) by mouth daily with breakfast., Disp: 10 tablet, Rfl: 0   albuterol (VENTOLIN HFA) 108 (90 Base) MCG/ACT inhaler, Inhale 2 puffs into the lungs every 6 (six) hours as needed for wheezing or shortness of breath (cough, shortness of breath or wheezing.)., Disp: 18 g, Rfl: 3   Fluticasone-Umeclidin-Vilant (TRELEGY ELLIPTA)  100-62.5-25 MCG/ACT AEPB, Inhale 1 puff into the lungs daily., Disp: 120 each, Rfl: 0   ipratropium (ATROVENT) 0.03 % nasal spray, Place 2 sprays into both nostrils 2 (two) times daily as needed for rhinitis., Disp: 90 mL, Rfl: 1   levothyroxine (SYNTHROID) 25 MCG tablet, TAKE 1 TABLET(25 MCG) BY MOUTH DAILY, Disp: 90 tablet, Rfl: 1   olmesartan (BENICAR) 20 MG tablet, TAKE 1 TABLET BY MOUTH EVERY DAY, Disp: 90 tablet, Rfl: 0   primidone (MYSOLINE) 50 MG tablet, TAKE 2 TABLETS BY MOUTH IN THE MORNING AND 1TABLET BY MOUTH AFTERNOON AND TAKE 2 TABLETS BY MOUTH AT NIGHT;, Disp: 450 tablet, Rfl: 0   propranolol ER (INDERAL LA) 60 MG 24 hr capsule, Take 1 capsule (60 mg total) by mouth daily., Disp: 90 capsule, Rfl: 0   rosuvastatin (CRESTOR) 40 MG tablet, TAKE 1 TABLET BY MOUTH EVERY DAY, Disp: 90 tablet, Rfl: 0   traZODone (DESYREL) 50 MG tablet, TAKE 1/2 TO 3 TABLETS(25 TO 150 MG) BY MOUTH AT BEDTIME AS NEEDED FOR SLEEP, Disp: 90 tablet, Rfl: 1  Observations/Objective: Patient is well-developed, well-nourished in no acute distress.  Resting comfortably at home.  Head is normocephalic, atraumatic.  No labored breathing. Speech is clear and coherent with logical content.  Patient is alert and oriented at baseline.   Assessment and Plan: 1. Acute bacterial sinusitis - doxycycline (VIBRA-TABS) 100 MG tablet; Take 1 tablet (100 mg total) by mouth 2 (two) times daily.  Dispense: 14 tablet; Refill: 0  2. Moderate persistent asthma with exacerbation - predniSONE (DELTASONE) 20 MG tablet; Take 2 tablets (40 mg total) by mouth daily with breakfast.  Dispense: 10 tablet; Refill: 0  Stop Augmentin. Start Doxycycline twice daily. Supportive measures and OTC medications reviewed. Will add-on 5-day burst of prednisone for asthma exacerbation. Strict in-person follow-up precautions reviewed.   Follow Up Instructions: I discussed the assessment and treatment plan with the patient. The patient was provided an  opportunity to ask questions and all were answered. The patient agreed with the plan and demonstrated an understanding of the instructions.  A copy of instructions were sent to the patient via MyChart unless otherwise noted below.   The patient was advised to call back or seek an in-person evaluation if the symptoms worsen or if the condition fails to improve as anticipated.  Time:  I spent 10 minutes with the patient via telehealth technology discussing the above problems/concerns.    Leeanne Rio, PA-C

## 2022-05-18 NOTE — Patient Instructions (Addendum)
  Jesse Bell, thank you for joining Leeanne Rio, PA-C for today's virtual visit.  While this provider is not your primary care provider (PCP), if your PCP is located in our provider database this encounter information will be shared with them immediately following your visit.   [facility] account gives you access to today's visit and all your visits, tests, and labs performed at [facility] " click here if you don't have a [facility] account or go to [email or web address]  Consent: (Patient) Jesse Bell provided verbal consent for this virtual visit at the beginning of the encounter.  Current Medications:  Current Outpatient Medications:    albuterol (VENTOLIN HFA) 108 (90 Base) MCG/ACT inhaler, Inhale 2 puffs into the lungs every 6 (six) hours as needed for wheezing or shortness of breath (cough, shortness of breath or wheezing.)., Disp: 18 g, Rfl: 3   amoxicillin-clavulanate (AUGMENTIN) 875-125 MG tablet, Take 1 tablet by mouth 2 (two) times daily for 7 days., Disp: 14 tablet, Rfl: 0   Fluticasone-Umeclidin-Vilant (TRELEGY ELLIPTA) 100-62.5-25 MCG/ACT AEPB, Inhale 1 puff into the lungs daily., Disp: 120 each, Rfl: 0   ipratropium (ATROVENT) 0.03 % nasal spray, Place 2 sprays into both nostrils 2 (two) times daily as needed for rhinitis., Disp: 90 mL, Rfl: 1   levothyroxine (SYNTHROID) 25 MCG tablet, TAKE 1 TABLET(25 MCG) BY MOUTH DAILY, Disp: 90 tablet, Rfl: 1   olmesartan (BENICAR) 20 MG tablet, TAKE 1 TABLET BY MOUTH EVERY DAY, Disp: 90 tablet, Rfl: 0   primidone (MYSOLINE) 50 MG tablet, TAKE 2 TABLETS BY MOUTH IN THE MORNING AND 1TABLET BY MOUTH AFTERNOON AND TAKE 2 TABLETS BY MOUTH AT NIGHT;, Disp: 450 tablet, Rfl: 0   propranolol ER (INDERAL LA) 60 MG 24 hr capsule, Take 1 capsule (60 mg total) by mouth daily., Disp: 90 capsule, Rfl: 0   rosuvastatin (CRESTOR) 40 MG tablet, TAKE 1 TABLET BY MOUTH EVERY DAY, Disp: 90 tablet, Rfl: 0   traZODone  (DESYREL) 50 MG tablet, TAKE 1/2 TO 3 TABLETS(25 TO 150 MG) BY MOUTH AT BEDTIME AS NEEDED FOR SLEEP, Disp: 90 tablet, Rfl: 1   Medications ordered in this encounter:  No orders of the defined types were placed in this encounter.    *If you need refills on other medications prior to your next appointment, please contact your pharmacy*  Follow-Up: Call back or seek an in-person evaluation if the symptoms worsen or if the condition fails to improve as anticipated.  [facility] [phone]  Other Instructions Stop Augmenting. Start Doxycycline. Increase fluids.  Continue your maintenance breathing medications. Mucinex OTC to thin out congestion. Take the prednisone as directed.  Follow-up in person if symptoms are not resolving.    If you have been instructed to have an in-person evaluation today at a local Urgent Care facility, please use the link below. It will take you to a list of all of our available [facility], including address, phone number and hours of operation. Please do not delay care.  [facility]  If you or a family member do not have a primary care provider, use the link below to schedule a visit and establish care. When you choose a [facility] primary care physician or advanced practice provider, you gain a long-term partner in health. Find a Primary Care Provider  Learn more about [facility]'s in-office and virtual care options: [facility]

## 2022-05-19 DIAGNOSIS — R112 Nausea with vomiting, unspecified: Secondary | ICD-10-CM | POA: Diagnosis not present

## 2022-05-30 ENCOUNTER — Other Ambulatory Visit: Payer: Self-pay | Admitting: Internal Medicine

## 2022-05-30 DIAGNOSIS — I10 Essential (primary) hypertension: Secondary | ICD-10-CM

## 2022-05-30 DIAGNOSIS — G969 Disorder of central nervous system, unspecified: Secondary | ICD-10-CM

## 2022-06-21 NOTE — Progress Notes (Unsigned)
Assessment/Plan:    1.  Essential Tremor  -Increase primidone to 50 mg 2 tablets three times per day for a week and then take primidone, 50 mg, 3 in the AM, 2 in the afternoon and 2 in the evening  -Patient on Inderal LA, 60 mg daily by primary care for blood pressure and tremor  -Discussed DBS and focused ultrasound he is not interested right now.  -Discussed Botox for essential tremor and the trial that is currently enrolling.  We will try to get him enrolled as he is interested in that.   Subjective:   Jesse Bell was seen today in follow up for tremor.  We slightly increased his primidone last visit.  We discussed that he could talk to his primary care about whether or not it was appropriate to change his metoprolol to propranolol.  He did that and is now on Inderal LA, 60 mg daily.  He thinks that the propranolol and metoprolol work about the same.  He has good and bad tremor days.  It sometimes interferes with eating, on a bad day.  He is still playing music.    Current prescribed movement disorder medications: Primidone, 50 mg, 2 in the morning, 1 at noon and 2 at bedtime (increased) Inderal LA, 60 mg daily (prescribed by primary care.   ALLERGIES:   Allergies  Allergen Reactions   Lisinopril Cough    COUGH    CURRENT MEDICATIONS:  Current Meds  Medication Sig   albuterol (VENTOLIN HFA) 108 (90 Base) MCG/ACT inhaler Inhale 2 puffs into the lungs every 6 (six) hours as needed for wheezing or shortness of breath (cough, shortness of breath or wheezing.).   Fluticasone-Umeclidin-Vilant (TRELEGY ELLIPTA) 100-62.5-25 MCG/ACT AEPB Inhale 1 puff into the lungs daily.   ipratropium (ATROVENT) 0.03 % nasal spray Place 2 sprays into both nostrils 2 (two) times daily as needed for rhinitis.   levothyroxine (SYNTHROID) 25 MCG tablet TAKE 1 TABLET(25 MCG) BY MOUTH DAILY   olmesartan (BENICAR) 20 MG tablet TAKE 1 TABLET BY MOUTH EVERY DAY   predniSONE (DELTASONE) 20 MG tablet Take 2  tablets (40 mg total) by mouth daily with breakfast.   primidone (MYSOLINE) 50 MG tablet TAKE 2 TABLETS BY MOUTH IN THE MORNING AND 1TABLET BY MOUTH AFTERNOON AND TAKE 2 TABLETS BY MOUTH AT NIGHT;   propranolol ER (INDERAL LA) 60 MG 24 hr capsule TAKE 1 CAPSULE BY MOUTH EVERY DAY   rosuvastatin (CRESTOR) 40 MG tablet TAKE 1 TABLET BY MOUTH EVERY DAY   traZODone (DESYREL) 50 MG tablet TAKE 1/2 TO 3 TABLETS(25 TO 150 MG) BY MOUTH AT BEDTIME AS NEEDED FOR SLEEP      Objective:    PHYSICAL EXAMINATION:    VITALS:   Vitals:   06/22/22 1451  BP: 118/78  Pulse: 72  SpO2: 96%  Weight: 212 lb (96.2 kg)  Height: '5\' 10"'$  (1.778 m)     GEN:  The patient appears stated age and is in NAD. HEENT:  Normocephalic, atraumatic.  The mucous membranes are moist. The superficial temporal arteries are without ropiness or tenderness.   Neurological examination:  Orientation: The patient is alert and oriented x3. Cranial nerves: There is good facial symmetry. The speech is fluent and clear. Soft palate rises symmetrically and there is no tongue deviation. Hearing is intact to conversational tone. Sensation: Sensation is intact to light touch throughout Motor: Strength is at least antigravity x4.  Movement examination: Tone: There is normal tone in  the bilateral upper extremities.  The tone in the lower extremities is normal.  Abnormal movements: No rest tremor.  There is postural tremor, right more so than left.   this is stable.  He has intention tremor, moderate bilaterally.  He does have trouble with Archimedes spirals, although they are a bit better than last visit. Coordination:  There is no decremation with RAM's, with any form of RAMS, including alternating supination and pronation of the forearm, hand opening and closing, finger taps, heel taps and toe taps. Gait and Station: The patient has no difficulty arising out of a deep-seated chair without the use of the hands. The patient's stride  length is good with antalgic gait because 1 leg is shorter than the other (uses a cane as well).  This is stable from prior visit. I have reviewed and interpreted the following labs independently   Chemistry      Component Value Date/Time   NA 136 03/01/2022 1433   NA 139 05/14/2019 1412   K 4.7 03/01/2022 1433   CL 100 03/01/2022 1433   CO2 30 03/01/2022 1433   BUN 15 03/01/2022 1433   BUN 14 05/14/2019 1412   CREATININE 0.77 03/01/2022 1433   CREATININE 0.87 02/25/2016 1227      Component Value Date/Time   CALCIUM 8.9 03/01/2022 1433   ALKPHOS 42 08/09/2020 1016   AST 27 08/09/2020 1016   ALT 15 08/09/2020 1016   BILITOT 0.4 08/09/2020 1016   BILITOT 0.4 05/14/2019 1412      Lab Results  Component Value Date   WBC 7.1 06/29/2021   HGB 13.0 06/29/2021   HCT 39.4 06/29/2021   MCV 87.1 06/29/2021   PLT 316.0 06/29/2021   Lab Results  Component Value Date   TSH 2.47 03/01/2022     Chemistry      Component Value Date/Time   NA 136 03/01/2022 1433   NA 139 05/14/2019 1412   K 4.7 03/01/2022 1433   CL 100 03/01/2022 1433   CO2 30 03/01/2022 1433   BUN 15 03/01/2022 1433   BUN 14 05/14/2019 1412   CREATININE 0.77 03/01/2022 1433   CREATININE 0.87 02/25/2016 1227      Component Value Date/Time   CALCIUM 8.9 03/01/2022 1433   ALKPHOS 42 08/09/2020 1016   AST 27 08/09/2020 1016   ALT 15 08/09/2020 1016   BILITOT 0.4 08/09/2020 1016   BILITOT 0.4 05/14/2019 1412         Total time spent on today's visit was 30 minutes, including both face-to-face time and nonface-to-face time.  Time included that spent on review of records (prior notes available to me/labs/imaging if pertinent), discussing treatment and goals, answering patient's questions and coordinating care (most of visit was spent on phone trying to coordinate trial information).  Cc:  Janith Lima, MD

## 2022-06-22 ENCOUNTER — Ambulatory Visit: Payer: PPO | Admitting: Neurology

## 2022-06-22 ENCOUNTER — Encounter: Payer: Self-pay | Admitting: Neurology

## 2022-06-22 VITALS — BP 118/78 | HR 72 | Ht 70.0 in | Wt 212.0 lb

## 2022-06-22 DIAGNOSIS — R251 Tremor, unspecified: Secondary | ICD-10-CM

## 2022-06-22 DIAGNOSIS — G250 Essential tremor: Secondary | ICD-10-CM

## 2022-06-22 MED ORDER — PRIMIDONE 50 MG PO TABS: ORAL_TABLET | ORAL | 1 refills | Status: DC

## 2022-06-22 NOTE — Patient Instructions (Signed)
Increase primidone to 50 mg 2 tablets three times per day for a week and then take primidone, 50 mg, 3 in the AM, 2 in the afternoon and 2 in the evening

## 2022-06-27 ENCOUNTER — Ambulatory Visit: Payer: PPO | Admitting: Internal Medicine

## 2022-06-30 ENCOUNTER — Telehealth: Payer: Self-pay | Admitting: Neurology

## 2022-06-30 NOTE — Telephone Encounter (Signed)
Pt called in stating he was told our office was going to try and get him into a research study for essential tremors. He has not heard back about that yet and was wanting to follow up.

## 2022-06-30 NOTE — Telephone Encounter (Signed)
Called patient and recruiting has ended for this trial

## 2022-07-06 ENCOUNTER — Other Ambulatory Visit: Payer: Self-pay | Admitting: Internal Medicine

## 2022-07-06 DIAGNOSIS — E785 Hyperlipidemia, unspecified: Secondary | ICD-10-CM

## 2022-08-25 ENCOUNTER — Other Ambulatory Visit: Payer: Self-pay | Admitting: Internal Medicine

## 2022-08-25 DIAGNOSIS — I10 Essential (primary) hypertension: Secondary | ICD-10-CM

## 2022-08-25 DIAGNOSIS — G969 Disorder of central nervous system, unspecified: Secondary | ICD-10-CM

## 2022-09-07 ENCOUNTER — Other Ambulatory Visit: Payer: Self-pay | Admitting: Internal Medicine

## 2022-09-07 DIAGNOSIS — E039 Hypothyroidism, unspecified: Secondary | ICD-10-CM

## 2022-10-03 ENCOUNTER — Other Ambulatory Visit: Payer: Self-pay | Admitting: Internal Medicine

## 2022-10-03 DIAGNOSIS — E785 Hyperlipidemia, unspecified: Secondary | ICD-10-CM

## 2022-10-16 ENCOUNTER — Ambulatory Visit (INDEPENDENT_AMBULATORY_CARE_PROVIDER_SITE_OTHER): Payer: PPO

## 2022-10-16 VITALS — Ht 69.0 in | Wt 212.0 lb

## 2022-10-16 DIAGNOSIS — Z0000 Encounter for general adult medical examination without abnormal findings: Secondary | ICD-10-CM | POA: Diagnosis not present

## 2022-10-16 NOTE — Patient Instructions (Signed)
Mr. Jesse Bell , Thank you for taking time to come for your Medicare Wellness Visit. I appreciate your ongoing commitment to your health goals. Please review the following plan we discussed and let me know if I can assist you in the future.   These are the goals we discussed:  Goals      declined     Patient declined goal at this time.      Oral Health Maintained     Evidence-based guidance:  Assess and address barriers to oral health or dental care; consider attitude, knowledge, child's age, development, available support or role-modeling.  Identify oral health needs by review of risk screen.  Encourage meticulous routine oral care that includes wiping, brushing (with an electric toothbrush if available), flossing and preventive care.  Encourage appropriate amount of fluoridated toothpaste (?osmear? until age 67, pea-sized amount ages 85 and older).  Promote age-appropriate use of xylitol-containing products throughout the day such as toothpaste, mouthwash, candies, mints or chewing gum.  Anticipate fluoride supplementation either parent administered and/or professionally applied.  Encourage breastfeeding mothers to continue up to age 21 months.  Provide anticipatory guidance regarding interplay between oral health and disease.  Encourage use of dental mouthguards when participating in contact sports.  Assist patient or parent to obtain dental care.   Notes:      Patient Stated     Would like to start playing music again once Covid is over.        This is a list of the screening recommended for you and due dates:  Health Maintenance  Topic Date Due   Zoster (Shingles) Vaccine (1 of 2) Never done   COVID-19 Vaccine (4 - 2023-24 season) 12/09/2021   Flu Shot  11/09/2022   Cologuard (Stool DNA test)  09/14/2023   Medicare Annual Wellness Visit  10/16/2023   DTaP/Tdap/Td vaccine (2 - Td or Tdap) 08/06/2030   Hepatitis C Screening  Completed   HIV Screening  Completed   HPV Vaccine   Aged Out    Advanced directives: Please bring a copy in once you are established.  Conditions/risks identified: Need both rounds of Shingrix, and need Flu vaccine this year.    Next appointment: Follow up in one year for your annual wellness visit.   Preventive Care 65 Years and Older, Male  Preventive care refers to lifestyle choices and visits with your health care provider that can promote health and wellness. What does preventive care include? A yearly physical exam. This is also called an annual well check. Dental exams once or twice a year. Routine eye exams. Ask your health care provider how often you should have your eyes checked. Personal lifestyle choices, including: Daily care of your teeth and gums. Regular physical activity. Eating a healthy diet. Avoiding tobacco and drug use. Limiting alcohol use. Practicing safe sex. Taking low doses of aspirin every day. Taking vitamin and mineral supplements as recommended by your health care provider. What happens during an annual well check? The services and screenings done by your health care provider during your annual well check will depend on your age, overall health, lifestyle risk factors, and family history of disease. Counseling  Your health care provider may ask you questions about your: Alcohol use. Tobacco use. Drug use. Emotional well-being. Home and relationship well-being. Sexual activity. Eating habits. History of falls. Memory and ability to understand (cognition). Work and work Astronomer. Screening  You may have the following tests or measurements: Height, weight, and BMI. Blood pressure.  Lipid and cholesterol levels. These may be checked every 5 years, or more frequently if you are over 33 years old. Skin check. Lung cancer screening. You may have this screening every year starting at age 88 if you have a 30-pack-year history of smoking and currently smoke or have quit within the past 15  years. Fecal occult blood test (FOBT) of the stool. You may have this test every year starting at age 15. Flexible sigmoidoscopy or colonoscopy. You may have a sigmoidoscopy every 5 years or a colonoscopy every 10 years starting at age 18. Prostate cancer screening. Recommendations will vary depending on your family history and other risks. Hepatitis C blood test. Hepatitis B blood test. Sexually transmitted disease (STD) testing. Diabetes screening. This is done by checking your blood sugar (glucose) after you have not eaten for a while (fasting). You may have this done every 1-3 years. Abdominal aortic aneurysm (AAA) screening. You may need this if you are a current or former smoker. Osteoporosis. You may be screened starting at age 54 if you are at high risk. Talk with your health care provider about your test results, treatment options, and if necessary, the need for more tests. Vaccines  Your health care provider may recommend certain vaccines, such as: Influenza vaccine. This is recommended every year. Tetanus, diphtheria, and acellular pertussis (Tdap, Td) vaccine. You may need a Td booster every 10 years. Zoster vaccine. You may need this after age 25. Pneumococcal 13-valent conjugate (PCV13) vaccine. One dose is recommended after age 58. Pneumococcal polysaccharide (PPSV23) vaccine. One dose is recommended after age 36. Talk to your health care provider about which screenings and vaccines you need and how often you need them. This information is not intended to replace advice given to you by your health care provider. Make sure you discuss any questions you have with your health care provider. Document Released: 04/23/2015 Document Revised: 12/15/2015 Document Reviewed: 01/26/2015 Elsevier Interactive Patient Education  2017 [employer].  Fall Prevention in the Home Falls can cause injuries. They can happen to people of all ages. There are many things you can do to make your home  safe and to help prevent falls. What can I do on the outside of my home? Regularly fix the edges of walkways and driveways and fix any cracks. Remove anything that might make you trip as you walk through a door, such as a raised step or threshold. Trim any bushes or trees on the path to your home. Use bright outdoor lighting. Clear any walking paths of anything that might make someone trip, such as rocks or tools. Regularly check to see if handrails are loose or broken. Make sure that both sides of any steps have handrails. Any raised decks and porches should have guardrails on the edges. Have any leaves, snow, or ice cleared regularly. Use sand or salt on walking paths during winter. Clean up any spills in your garage right away. This includes oil or grease spills. What can I do in the bathroom? Use night lights. Install grab bars by the toilet and in the tub and shower. Do not use towel bars as grab bars. Use non-skid mats or decals in the tub or shower. If you need to sit down in the shower, use a plastic, non-slip stool. Keep the floor dry. Clean up any water that spills on the floor as soon as it happens. Remove soap buildup in the tub or shower regularly. Attach bath mats securely with double-sided non-slip rug tape.  Do not have throw rugs and other things on the floor that can make you trip. What can I do in the bedroom? Use night lights. Make sure that you have a light by your bed that is easy to reach. Do not use any sheets or blankets that are too big for your bed. They should not hang down onto the floor. Have a firm chair that has side arms. You can use this for support while you get dressed. Do not have throw rugs and other things on the floor that can make you trip. What can I do in the kitchen? Clean up any spills right away. Avoid walking on wet floors. Keep items that you use a lot in easy-to-reach places. If you need to reach something above you, use a strong step  stool that has a grab bar. Keep electrical cords out of the way. Do not use floor polish or wax that makes floors slippery. If you must use wax, use non-skid floor wax. Do not have throw rugs and other things on the floor that can make you trip. What can I do with my stairs? Do not leave any items on the stairs. Make sure that there are handrails on both sides of the stairs and use them. Fix handrails that are broken or loose. Make sure that handrails are as long as the stairways. Check any carpeting to make sure that it is firmly attached to the stairs. Fix any carpet that is loose or worn. Avoid having throw rugs at the top or bottom of the stairs. If you do have throw rugs, attach them to the floor with carpet tape. Make sure that you have a light switch at the top of the stairs and the bottom of the stairs. If you do not have them, ask someone to add them for you. What else can I do to help prevent falls? Wear shoes that: Do not have high heels. Have rubber bottoms. Are comfortable and fit you well. Are closed at the toe. Do not wear sandals. If you use a stepladder: Make sure that it is fully opened. Do not climb a closed stepladder. Make sure that both sides of the stepladder are locked into place. Ask someone to hold it for you, if possible. Clearly mark and make sure that you can see: Any grab bars or handrails. First and last steps. Where the edge of each step is. Use tools that help you move around (mobility aids) if they are needed. These include: Canes. Walkers. Scooters. Crutches. Turn on the lights when you go into a dark area. Replace any light bulbs as soon as they burn out. Set up your furniture so you have a clear path. Avoid moving your furniture around. If any of your floors are uneven, fix them. If there are any pets around you, be aware of where they are. Review your medicines with your doctor. Some medicines can make you feel dizzy. This can increase your  chance of falling. Ask your doctor what other things that you can do to help prevent falls. This information is not intended to replace advice given to you by your health care provider. Make sure you discuss any questions you have with your health care provider. Document Released: 01/21/2009 Document Revised: 09/02/2015 Document Reviewed: 05/01/2014 Elsevier Interactive Patient Education  2017 [employer].

## 2022-10-16 NOTE — Progress Notes (Signed)
Subjective:   Jesse Bell is a 62 y.o. male who presents for Medicare Annual/Subsequent preventive examination.  Visit Complete: Virtual  I connected with  Tommas A Yeates on 10/16/22 by a audio enabled telemedicine application and verified that I am speaking with the correct person using two identifiers.  Patient Location: Home  Provider Location: Home Office  I discussed the limitations of evaluation and management by telemedicine. The patient expressed understanding and agreed to proceed.  Review of Systems    Cardiac Risk Factors include: advanced age (>85men, >34 women);dyslipidemia;Other (see comment);obesity (BMI >30kg/m2), Risk factor comments: hypothyroid,     Objective:    Today's Vitals   10/16/22 1258 10/16/22 1300  Weight: 212 lb (96.2 kg)   Height: 5\' 9"  (1.753 m)   PainSc:  3    Body mass index is 31.31 kg/m.     10/16/2022    1:17 PM 06/22/2022    2:52 PM 12/08/2021    3:01 PM 06/14/2021    1:27 PM 05/06/2021    1:51 PM 03/18/2020    1:07 PM 12/19/2018    1:07 PM  Advanced Directives  Does Patient Have a Medical Advance Directive? No No No No No No No  Would patient like information on creating a medical advance directive? No - Patient declined    No - Patient declined No - Patient declined No - Patient declined    Current Medications (verified) Outpatient Encounter Medications as of 10/16/2022  Medication Sig   albuterol (VENTOLIN HFA) 108 (90 Base) MCG/ACT inhaler Inhale 2 puffs into the lungs every 6 (six) hours as needed for wheezing or shortness of breath (cough, shortness of breath or wheezing.).   ipratropium (ATROVENT) 0.03 % nasal spray Place 2 sprays into both nostrils 2 (two) times daily as needed for rhinitis.   levothyroxine (SYNTHROID) 25 MCG tablet TAKE 1 TABLET(25 MCG) BY MOUTH DAILY   olmesartan (BENICAR) 20 MG tablet TAKE 1 TABLET BY MOUTH EVERY DAY   primidone (MYSOLINE) 50 MG tablet 3 in the AM, 2 in the afternoon and 2 in the evening    propranolol ER (INDERAL LA) 60 MG 24 hr capsule TAKE 1 CAPSULE BY MOUTH EVERY DAY   rosuvastatin (CRESTOR) 40 MG tablet TAKE 1 TABLET BY MOUTH EVERY DAY   traZODone (DESYREL) 50 MG tablet TAKE 1/2 TO 3 TABLETS(25 TO 150 MG) BY MOUTH AT BEDTIME AS NEEDED FOR SLEEP   Fluticasone-Umeclidin-Vilant (TRELEGY ELLIPTA) 100-62.5-25 MCG/ACT AEPB Inhale 1 puff into the lungs daily.   predniSONE (DELTASONE) 20 MG tablet Take 2 tablets (40 mg total) by mouth daily with breakfast. (Patient not taking: Reported on 10/16/2022)   No facility-administered encounter medications on file as of 10/16/2022.    Allergies (verified) Lisinopril   History: Past Medical History:  Diagnosis Date   Allergy    Back pain    Hyperlipidemia    Hypertension    Hypertriglyceridemia    Hypothyroid    Past Surgical History:  Procedure Laterality Date   FRACTURE SURGERY     x6   KNEE SURGERY     Family History  Problem Relation Age of Onset   Congestive Heart Failure Mother    Pulmonary fibrosis Father    Pulmonary fibrosis Brother        lung transplant   Heart Problems Brother    Skin cancer Brother    Macular degeneration Brother    Social History   Socioeconomic History   Marital status: Significant Other  Spouse name: Olegario Messier   Number of children: 1   Years of education: Not on file   Highest education level: Not on file  Occupational History   Occupation: disabled  Tobacco Use   Smoking status: Never   Smokeless tobacco: Never  Vaping Use   Vaping Use: Never used  Substance and Sexual Activity   Alcohol use: Yes    Alcohol/week: 1.0 - 2.0 standard drink of alcohol    Types: 1 - 2 Cans of beer per week    Comment: 1-2 times a week    Drug use: No   Sexual activity: Not Currently  Other Topics Concern   Not on file  Social History Narrative   Right handed   Lives with girlfriend Olegario Messier)   Disabled   Social Determinants of Health   Financial Resource Strain: Low Risk  (10/16/2022)    Overall Financial Resource Strain (CARDIA)    Difficulty of Paying Living Expenses: Not very hard  Food Insecurity: No Food Insecurity (10/16/2022)   Hunger Vital Sign    Worried About Running Out of Food in the Last Year: Never true    Ran Out of Food in the Last Year: Never true  Transportation Needs: Unmet Transportation Needs (10/16/2022)   PRAPARE - Transportation    Lack of Transportation (Medical): Yes    Lack of Transportation (Non-Medical): Yes  Physical Activity: Insufficiently Active (10/16/2022)   Exercise Vital Sign    Days of Exercise per Week: 7 days    Minutes of Exercise per Session: 20 min  Stress: No Stress Concern Present (10/16/2022)   [employer] of Occupational Health - Occupational Stress Questionnaire    Feeling of Stress : Not at all  Social Connections: Moderately Integrated (10/16/2022)   Social Connection and Isolation Panel [NHANES]    Frequency of Communication with Friends and Family: Twice a week    Frequency of Social Gatherings with Friends and Family: Twice a week    Attends Religious Services: Never    Database administrator or Organizations: Yes    Attends Engineer, structural: More than 4 times per year    Marital Status: Living with partner    Tobacco Counseling Counseling given: Not Answered   Clinical Intake:  Pre-visit preparation completed: Yes  Pain : 0-10 (Back and rt hip most days.) Pain Score: 3  Pain Type: Chronic pain Pain Location: Hip Pain Orientation: Right Pain Descriptors / Indicators: Dull, Aching Pain Onset: Other (comment) Pain Frequency: Constant     BMI - recorded: 31.31 Nutritional Status: BMI > 30  Obese Nutritional Risks: None Diabetes: No  How often do you need to have someone help you when you read instructions, pamphlets, or other written materials from your doctor or pharmacy?: 1 - Never  Interpreter Needed?: No  Information entered by :: Tawsha Terrero, RMA   Activities of Daily  Living    10/16/2022    1:05 PM 10/12/2022   11:31 AM  In your present state of health, do you have any difficulty performing the following activities:  Hearing? 0 0  Vision? 0 0  Difficulty concentrating or making decisions? 0 0  Walking or climbing stairs? 1 1  Comment Going down stairs, has to sit and scoot down at home.   Dressing or bathing? 0 0  Doing errands, shopping? 0 0  Preparing Food and eating ? N N  Using the Toilet? N N  In the past six months, have you accidently leaked urine?  N N  Do you have problems with loss of bowel control? N N  Managing your Medications? N N  Managing your Finances? N N  Housekeeping or managing your Housekeeping? N N    Patient Care Team: Etta Grandchild, MD as PCP - General (Internal Medicine) Tat, Octaviano Batty, DO as Consulting Physician (Neurology) Tobias Alexander, OD as Referring Physician (Optometry)  Indicate any recent Medical Services you may have received from other than [facility] providers in the past year (date may be approximate).     Assessment:   This is a routine wellness examination for [facility].  Hearing/Vision screen Hearing Screening - Comments:: Denies hearing issues. Vision Screening - Comments:: Wears eyeglasses.  Dietary issues and exercise activities discussed:     Goals Addressed             This Visit's Progress    Oral Health Maintained       Evidence-based guidance:  Assess and address barriers to oral health or dental care; consider attitude, knowledge, child's age, development, available support or role-modeling.  Identify oral health needs by review of risk screen.  Encourage meticulous routine oral care that includes wiping, brushing (with an electric toothbrush if available), flossing and preventive care.  Encourage appropriate amount of fluoridated toothpaste (?osmear? until age 81, pea-sized amount ages 9 and older).  Promote age-appropriate use of xylitol-containing products throughout the day such as  toothpaste, mouthwash, candies, mints or chewing gum.  Anticipate fluoride supplementation either parent administered and/or professionally applied.  Encourage breastfeeding mothers to continue up to age 40 months.  Provide anticipatory guidance regarding interplay between oral health and disease.  Encourage use of dental mouthguards when participating in contact sports.  Assist patient or parent to obtain dental care.   Notes:      COMPLETED: Patient Stated       Would like to start playing music again once Covid is over.      Depression Screen    10/16/2022    1:26 PM 03/01/2022    2:15 PM 05/06/2021    1:54 PM 05/06/2021    1:50 PM 08/05/2020   10:42 AM 03/18/2020    1:04 PM 02/24/2020    1:01 PM  PHQ 2/9 Scores  PHQ - 2 Score 0 0 0 0 0 0 0  PHQ- 9 Score 1          Fall Risk    10/16/2022    1:18 PM 10/12/2022   11:31 AM 06/22/2022    2:52 PM 05/05/2022    4:22 PM 03/01/2022    2:12 PM  Fall Risk   Falls in the past year? 0 0 0 0 0  Number falls in past yr: 0  0  0  Injury with Fall? 0  0  0  Risk for fall due to : No Fall Risks;Medication side effect    Impaired balance/gait  Follow up Falls prevention discussed  Falls evaluation completed  Falls evaluation completed    MEDICARE RISK AT HOME:  Medicare Risk at Home - 10/16/22 1318     Any stairs in or around the home? Yes    If so, are there any without handrails? Yes    Home free of loose throw rugs in walkways, pet beds, electrical cords, etc? Yes    Adequate lighting in your home to reduce risk of falls? Yes    Life alert? No    Use of a cane, walker or w/c? Yes   [place] Mor  Grab bars in the bathroom? Yes    Shower chair or bench in shower? No    Elevated toilet seat or a handicapped toilet? No             TIMED UP AND GO:  Was the test performed?  No    Cognitive Function:        10/16/2022    1:19 PM 03/18/2020    1:02 PM 12/19/2018    1:07 PM 12/06/2016    1:29 PM  6CIT Screen  What Year? 0 points 0  points 0 points 0 points  What month? 0 points 0 points 0 points 0 points  What time? 0 points 0 points 0 points 0 points  Count back from 20 0 points 0 points 0 points 0 points  Months in reverse 0 points 0 points 0 points 0 points  Repeat phrase 0 points 0 points 0 points 2 points  Total Score 0 points 0 points 0 points 2 points    Immunizations Immunization History  Administered Date(s) Administered   Influenza,inj,Quad PF,6+ Mos 02/12/2017, 02/08/2018   Influenza-Unspecified 02/12/2017, 02/04/2020   PFIZER(Purple Top)SARS-COV-2 Vaccination 06/28/2019, 07/19/2019, 02/02/2020   PNEUMOCOCCAL CONJUGATE-20 08/05/2020   Tdap 08/05/2020    TDAP status: Up to date  Flu Vaccine status: Due, Education has been provided regarding the importance of this vaccine. Advised may receive this vaccine at local pharmacy or Health Dept. Aware to provide a copy of the vaccination record if obtained from local pharmacy or Health Dept. Verbalized acceptance and understanding.  Pneumococcal vaccine status: Up to date  Covid-19 vaccine status: Information provided on how to obtain vaccines.   Qualifies for Shingles Vaccine? Yes   Zostavax completed No   Shingrix Completed?: No.    Education has been provided regarding the importance of this vaccine. Patient has been advised to call insurance company to determine out of pocket expense if they have not yet received this vaccine. Advised may also receive vaccine at local pharmacy or Health Dept. Verbalized acceptance and understanding.  Screening Tests Health Maintenance  Topic Date Due   Zoster Vaccines- Shingrix (1 of 2) Never done   COVID-19 Vaccine (4 - 2023-24 season) 12/09/2021   INFLUENZA VACCINE  11/09/2022   Fecal DNA (Cologuard)  09/14/2023   Medicare Annual Wellness (AWV)  10/16/2023   DTaP/Tdap/Td (2 - Td or Tdap) 08/06/2030   Hepatitis C Screening  Completed   HIV Screening  Completed   HPV VACCINES  Aged Out    Health  Maintenance  Health Maintenance Due  Topic Date Due   Zoster Vaccines- Shingrix (1 of 2) Never done   COVID-19 Vaccine (4 - 2023-24 season) 12/09/2021    Colorectal cancer screening: Type of screening: Cologuard. Completed 09/13/2020. Repeat every 3 years  Lung Cancer Screening: (Low Dose CT Chest recommended if Age 29-80 years, 20 pack-year currently smoking OR have quit w/in 15years.) does not qualify.   Lung Cancer Screening Referral: N/A  Additional Screening:  Hepatitis C Screening: does qualify; Completed 03/14/2012  Vision Screening: Recommended annual ophthalmology exams for early detection of glaucoma and other disorders of the eye. Is the patient up to date with their annual eye exam?  Yes (per patient) Who is the provider or what is the name of the office in which the patient attends annual eye exams? [facility] If pt is not established with a provider, would they like to be referred to a provider to establish care? No .   Dental Screening: Recommended  annual dental exams for proper oral hygiene  Community Resource Referral / Chronic Care Management: CRR required this visit?  No   CCM required this visit?  No     Plan:     I have personally reviewed and noted the following in the patient's chart:   Medical and social history Use of alcohol, tobacco or illicit drugs  Current medications and supplements including opioid prescriptions. Patient is not currently taking opioid prescriptions. Functional ability and status Nutritional status Physical activity Advanced directives List of other physicians Hospitalizations, surgeries, and ER visits in previous 12 months Vitals Screenings to include cognitive, depression, and falls Referrals and appointments  In addition, I have reviewed and discussed with patient certain preventive protocols, quality metrics, and best practice recommendations. A written personalized care plan for preventive services as well as general  preventive health recommendations were provided to patient.     Shalanda Brogden L Bao Coreas, CMA   10/16/2022   After Visit Summary: (MyChart) Due to this being a telephonic visit, the after visit summary with patients personalized plan was offered to patient via MyChart   Nurse Notes: Patient needs to get series of Shingrix.  He will get Flu vaccine this season (12/2022).

## 2022-10-20 ENCOUNTER — Other Ambulatory Visit: Payer: Self-pay | Admitting: Internal Medicine

## 2022-10-20 DIAGNOSIS — J4520 Mild intermittent asthma, uncomplicated: Secondary | ICD-10-CM

## 2022-10-20 DIAGNOSIS — I10 Essential (primary) hypertension: Secondary | ICD-10-CM

## 2022-11-11 ENCOUNTER — Other Ambulatory Visit: Payer: Self-pay | Admitting: Internal Medicine

## 2022-11-11 DIAGNOSIS — I10 Essential (primary) hypertension: Secondary | ICD-10-CM

## 2022-11-14 ENCOUNTER — Ambulatory Visit (INDEPENDENT_AMBULATORY_CARE_PROVIDER_SITE_OTHER): Payer: PPO

## 2022-11-14 ENCOUNTER — Ambulatory Visit: Payer: PPO | Admitting: Internal Medicine

## 2022-11-14 ENCOUNTER — Encounter: Payer: Self-pay | Admitting: Internal Medicine

## 2022-11-14 VITALS — BP 128/84 | HR 71 | Temp 97.9°F | Resp 16 | Ht 69.0 in | Wt 201.0 lb

## 2022-11-14 DIAGNOSIS — E785 Hyperlipidemia, unspecified: Secondary | ICD-10-CM

## 2022-11-14 DIAGNOSIS — I10 Essential (primary) hypertension: Secondary | ICD-10-CM | POA: Diagnosis not present

## 2022-11-14 DIAGNOSIS — J4520 Mild intermittent asthma, uncomplicated: Secondary | ICD-10-CM | POA: Diagnosis not present

## 2022-11-14 DIAGNOSIS — R058 Other specified cough: Secondary | ICD-10-CM

## 2022-11-14 DIAGNOSIS — R062 Wheezing: Secondary | ICD-10-CM | POA: Diagnosis not present

## 2022-11-14 DIAGNOSIS — E7841 Elevated Lipoprotein(a): Secondary | ICD-10-CM | POA: Diagnosis not present

## 2022-11-14 DIAGNOSIS — E039 Hypothyroidism, unspecified: Secondary | ICD-10-CM | POA: Diagnosis not present

## 2022-11-14 DIAGNOSIS — R059 Cough, unspecified: Secondary | ICD-10-CM | POA: Diagnosis not present

## 2022-11-14 MED ORDER — TRELEGY ELLIPTA 100-62.5-25 MCG/ACT IN AEPB
1.0000 | INHALATION_SPRAY | Freq: Every day | RESPIRATORY_TRACT | 0 refills | Status: DC
Start: 2022-11-14 — End: 2023-01-29

## 2022-11-14 NOTE — Progress Notes (Signed)
Subjective:  Patient ID: Jesse Bell, male    DOB: 1960-04-15  Age: 62 y.o. MRN: [id number]  CC: Asthma, Hypertension, Hyperlipidemia, and Hypothyroidism   HPI Jesse Bell presents for f/up ---  Discussed the use of AI scribe software for clinical note transcription with the patient, who gave verbal consent to proceed.  History of Present Illness   The patient, with a history of asthma, hypertension, and tremors, presents with a persistent cough of approximately two months duration. The cough is most severe in the mornings, producing phlegm that can be colored and thick, often green. The cough improves as the day progresses and is alleviated by daily Claritin D. The patient denies associated dyspnea, chest pain, fever, chills, and night sweats.  The patient has been managing their asthma with an inhaler, which they use when they experience wheezing before sleep. The inhaler induces coughing but subsequently improves their symptoms, allowing them to fall asleep. They report running out of a previously prescribed medication, Trelegy, after a two-month course.  The patient's other medications include a thyroid supplement, Olmesartan for hypertension, primidone for tremors, propranolol, rosuvastatin, and occasional trazodone for sleep. They report running out of Olmesartan recently.  The patient has a family history of heart disease, with their mother passing away from congestive heart failure. They had a cardiology evaluation approximately two years ago, which was reportedly normal. They monitor their blood pressure at home weekly and report being a nonsmoker and nondrinker.        Outpatient Medications Prior to Visit  Medication Sig Dispense Refill   albuterol (VENTOLIN HFA) 108 (90 Base) MCG/ACT inhaler Inhale 2 puffs into the lungs every 6 (six) hours as needed for wheezing or shortness of breath (cough, shortness of breath or wheezing.). 18 g 3   ipratropium (ATROVENT) 0.03 % nasal  spray Place 2 sprays into both nostrils 2 (two) times daily as needed for rhinitis. 90 mL 1   levothyroxine (SYNTHROID) 25 MCG tablet TAKE 1 TABLET(25 MCG) BY MOUTH DAILY 90 tablet 0   primidone (MYSOLINE) 50 MG tablet 3 in the AM, 2 in the afternoon and 2 in the evening 630 tablet 1   propranolol ER (INDERAL LA) 60 MG 24 hr capsule TAKE 1 CAPSULE BY MOUTH EVERY DAY 90 capsule 0   rosuvastatin (CRESTOR) 40 MG tablet TAKE 1 TABLET BY MOUTH EVERY DAY 90 tablet 0   traZODone (DESYREL) 50 MG tablet TAKE 1/2 TO 3 TABLETS(25 TO 150 MG) BY MOUTH AT BEDTIME AS NEEDED FOR SLEEP 90 tablet 1   olmesartan (BENICAR) 20 MG tablet TAKE 1 TABLET BY MOUTH EVERY DAY 90 tablet 0   predniSONE (DELTASONE) 20 MG tablet Take 2 tablets (40 mg total) by mouth daily with breakfast. 10 tablet 0   Fluticasone-Umeclidin-Vilant (TRELEGY ELLIPTA) 100-62.5-25 MCG/ACT AEPB Inhale 1 puff into the lungs daily. 120 each 0   No facility-administered medications prior to visit.    ROS Review of Systems  Constitutional:  Negative for appetite change, chills, diaphoresis, fatigue and fever.  HENT: Negative.  Negative for sore throat and trouble swallowing.   Eyes: Negative.   Respiratory:  Positive for cough and wheezing. Negative for chest tightness, shortness of breath and stridor.   Cardiovascular:  Negative for chest pain, palpitations and leg swelling.  Gastrointestinal:  Negative for abdominal pain, constipation, diarrhea, nausea and vomiting.  Endocrine: Negative.   Genitourinary: Negative.  Negative for difficulty urinating.  Musculoskeletal: Negative.  Negative for myalgias.  Skin: Negative.  Neurological:  Positive for tremors. Negative for dizziness.  Hematological:  Negative for adenopathy. Does not bruise/bleed easily.  Psychiatric/Behavioral: Negative.      Objective:  BP 128/84 (BP Location: Left Arm, Patient Position: Sitting, Cuff Size: Large)   Pulse 71   Temp 97.9 F (36.6 C) (Oral)   Resp 16   Ht 5'  9" (1.753 m)   Wt 201 lb (91.2 kg)   SpO2 93%   BMI 29.68 kg/m   BP Readings from Last 3 Encounters:  11/14/22 128/84  06/22/22 118/78  03/01/22 138/82    Wt Readings from Last 3 Encounters:  11/14/22 201 lb (91.2 kg)  10/16/22 212 lb (96.2 kg)  06/22/22 212 lb (96.2 kg)    Physical Exam Vitals reviewed.  Constitutional:      Appearance: Normal appearance. He is not ill-appearing.  HENT:     Nose: Nose normal.     Mouth/Throat:     Mouth: Mucous membranes are moist.  Eyes:     General: No scleral icterus.    Conjunctiva/sclera: Conjunctivae normal.  Cardiovascular:     Rate and Rhythm: Normal rate.     Heart sounds: No murmur heard.    No friction rub. No gallop.     Comments: EKG- NSR, 68 bpm Incomplete RBBB with T wave changes in III, V1, V3 No LVH, Q waves, or acute ST/T waves  unchanged Pulmonary:     Effort: Pulmonary effort is normal.     Breath sounds: Examination of the right-lower field reveals rhonchi. Rhonchi present. No decreased breath sounds, wheezing or rales.  Abdominal:     General: Abdomen is flat.     Tenderness: There is no abdominal tenderness. There is no guarding or rebound.     Hernia: No hernia is present.  Musculoskeletal:        General: Normal range of motion.     Cervical back: Neck supple.     Right lower leg: No edema.     Left lower leg: No edema.  Skin:    General: Skin is warm and dry.  Neurological:     General: No focal deficit present.     Mental Status: He is alert. Mental status is at baseline.  Psychiatric:        Mood and Affect: Mood normal.        Behavior: Behavior normal.     Lab Results  Component Value Date   WBC 7.1 11/14/2022   HGB 13.3 11/14/2022   HCT 41.7 11/14/2022   PLT 317.0 11/14/2022   GLUCOSE 98 11/14/2022   CHOL 208 (H) 11/14/2022   TRIG (H) 11/14/2022    446.0 Triglyceride is over 400; calculations on Lipids are invalid.   HDL 63.60 11/14/2022   LDLDIRECT 108.0 11/14/2022   LDLCALC 72  08/09/2020   ALT 24 11/14/2022   AST 41 (H) 11/14/2022   NA 133 (L) 11/14/2022   K 4.1 11/14/2022   CL 99 11/14/2022   CREATININE 0.75 11/14/2022   BUN 15 11/14/2022   CO2 24 11/14/2022   TSH 1.62 11/14/2022   PSA 0.08 (L) 03/01/2022    MR BRAIN WO CONTRAST  Result Date: 07/04/2021 CLINICAL DATA:  Provided history: Tremor. Additional history provided by scanning technologist: Patient reports bilateral hand tremors for several years, but worsening in the last 6 months. EXAM: MRI HEAD WITHOUT CONTRAST TECHNIQUE: Multiplanar, multiecho pulse sequences of the brain and surrounding structures were obtained without intravenous contrast. COMPARISON:  Head CT 04/02/2003.  FINDINGS: Mild intermittent motion degradation. Brain: Mild generalized cerebral and cerebellar volume loss. No cortical encephalomalacia is identified. No significant cerebral white matter disease. There is no acute infarct. No evidence of an intracranial mass. No chronic intracranial blood products. No extra-axial fluid collection. No midline shift. Vascular: Maintained flow voids within the proximal large arterial vessels. Skull and upper cervical spine: No focal suspicious marrow lesion. Sinuses/Orbits: Visualized orbits show no acute finding. 3.2 cm mucous retention cyst within the left maxillary sinus. Minimal mucosal thickening within the bilateral ethmoid sinuses. IMPRESSION: No evidence of acute intracranial abnormality. Mild generalized parenchymal volume loss. Otherwise unremarkable non-contrast MRI appearance of the brain. Paranasal sinus disease, as described. Electronically Signed   By: Jackey Loge D.O.   On: 07/04/2021 07:44    DG Chest 2 View  Result Date: 11/14/2022 CLINICAL DATA:  Cough, wheezing EXAM: CHEST - 2 VIEW COMPARISON:  03/01/2022 FINDINGS: The heart size and mediastinal contours are within normal limits. Both lungs are clear. Deformity in the upper ribs have not changed suggesting old healed fractures.  IMPRESSION: No active cardiopulmonary disease. Electronically Signed   By: Ernie Avena M.D.   On: 11/14/2022 16:16     Assessment & Plan:   Acquired hypothyroidism- He is euthyroid. -     TSH; Future  Primary hypertension- His blood pressure is well-controlled. -     Basic metabolic panel; Future -     CBC with Differential/Platelet; Future -     EKG 12-Lead  Dyslipidemia, goal LDL below 100 - LDL goal achieved. Doing well on the statin  -     Lipoprotein A (LPA); Future -     Lipid panel; Future -     Hepatic function panel; Future -     TSH; Future -     CT CARDIAC SCORING (SELF PAY ONLY); Future -     Aspirin; Take 1 tablet (81 mg total) by mouth daily. Swallow whole.  Dispense: 90 tablet; Refill: 1  Cough productive of purulent sputum- CXR is normal. -     DG Chest 2 View; Future  Mild intermittent asthma without complication- Will restart the LAMA/LABA/ICS inhaler. -     Trelegy Ellipta; Inhale 1 puff into the lungs daily.  Dispense: 120 each; Refill: 0  High serum lipoprotein(a)- Will evaluate for CAD with a CCS. -     CT CARDIAC SCORING (SELF PAY ONLY); Future -     Aspirin; Take 1 tablet (81 mg total) by mouth daily. Swallow whole.  Dispense: 90 tablet; Refill: 1  Other orders -     LDL cholesterol, direct     Follow-up: Return in about 4 months (around 03/16/2023).  Sanda Linger, MD

## 2022-11-14 NOTE — Patient Instructions (Signed)

## 2022-11-15 ENCOUNTER — Other Ambulatory Visit: Payer: Self-pay | Admitting: Internal Medicine

## 2022-11-15 DIAGNOSIS — I10 Essential (primary) hypertension: Secondary | ICD-10-CM

## 2022-11-17 DIAGNOSIS — E7841 Elevated Lipoprotein(a): Secondary | ICD-10-CM | POA: Insufficient documentation

## 2022-11-17 MED ORDER — ASPIRIN 81 MG PO TBEC
81.0000 mg | DELAYED_RELEASE_TABLET | Freq: Every day | ORAL | 1 refills | Status: AC
Start: 2022-11-17 — End: ?

## 2022-11-20 ENCOUNTER — Other Ambulatory Visit: Payer: Self-pay | Admitting: Internal Medicine

## 2022-11-20 DIAGNOSIS — I10 Essential (primary) hypertension: Secondary | ICD-10-CM

## 2022-11-20 DIAGNOSIS — R251 Tremor, unspecified: Secondary | ICD-10-CM

## 2022-12-03 ENCOUNTER — Other Ambulatory Visit: Payer: Self-pay | Admitting: Internal Medicine

## 2022-12-03 DIAGNOSIS — E039 Hypothyroidism, unspecified: Secondary | ICD-10-CM

## 2022-12-13 ENCOUNTER — Other Ambulatory Visit: Payer: Self-pay

## 2022-12-13 DIAGNOSIS — R251 Tremor, unspecified: Secondary | ICD-10-CM

## 2022-12-13 MED ORDER — PRIMIDONE 50 MG PO TABS
ORAL_TABLET | ORAL | 0 refills | Status: DC
Start: 2022-12-13 — End: 2023-03-01

## 2022-12-21 NOTE — Progress Notes (Signed)
Assessment/Plan:    1.  Essential Tremor  -Continue primidone, 50 mg, 3 in the AM, 2 in the afternoon and 2 in the evening  -Patient on Inderal LA, 60 mg daily by primary care for blood pressure and tremor.  Discussed that we can try and change to propranolol 40 mg bid.  He decided to hold off on that change for now.  -Also told the patient that we could add in as needed IR propranolol, 10-20 mg.  He thinks doing okay right now  -Discussed DBS again.  Discussed focused ultrasound again.  He has fairly significant tremor on the right>left.  We discussed HTA issues with [facility].  He would need an exception to go out of network.  Info given on focused ultrasound     Subjective:   Jesse Bell was seen today in follow up for tremor.  We discussed last visit Botox for essential tremor and the trial going on for that, but unfortunately, the trial had completed by the time we tried to get him enrolled.  It has not yet been FDA approved, so insurance would not yet pay for the Botox for essential tremor.  He is not interested in surgical interventions.  He did increase his primidone after last visit.  He notes good and bad days, but more good days than bad.  "Most days I feel like I am doing alright."  If he sleeps better, he has a better day.   He has trouble with fine motor coordination.  No lightheaded/near syncope on Inderal LA.    Current prescribed movement disorder medications: Primidone, 50 mg, 3 in the morning, 2 at noon and 2 at bedtime (increased) Inderal LA, 60 mg daily (prescribed by primary care.   ALLERGIES:   Allergies  Allergen Reactions   Lisinopril Cough    COUGH    CURRENT MEDICATIONS:  Current Meds  Medication Sig   albuterol (VENTOLIN HFA) 108 (90 Base) MCG/ACT inhaler Inhale 2 puffs into the lungs every 6 (six) hours as needed for wheezing or shortness of breath (cough, shortness of breath or wheezing.).   aspirin EC 81 MG tablet Take 1 tablet (81 mg total) by mouth  daily. Swallow whole.   Fluticasone-Umeclidin-Vilant (TRELEGY ELLIPTA) 100-62.5-25 MCG/ACT AEPB Inhale 1 puff into the lungs daily.   ipratropium (ATROVENT) 0.03 % nasal spray Place 2 sprays into both nostrils 2 (two) times daily as needed for rhinitis.   levothyroxine (SYNTHROID) 25 MCG tablet TAKE 1 TABLET(25 MCG) BY MOUTH DAILY   olmesartan (BENICAR) 20 MG tablet TAKE 1 TABLET BY MOUTH EVERY DAY   primidone (MYSOLINE) 50 MG tablet 3 in the AM, 2 in the afternoon and 2 in the evening   propranolol ER (INDERAL LA) 60 MG 24 hr capsule TAKE 1 CAPSULE BY MOUTH EVERY DAY   rosuvastatin (CRESTOR) 40 MG tablet TAKE 1 TABLET BY MOUTH EVERY DAY   traZODone (DESYREL) 50 MG tablet TAKE 1/2 TO 3 TABLETS(25 TO 150 MG) BY MOUTH AT BEDTIME AS NEEDED FOR SLEEP      Objective:    PHYSICAL EXAMINATION:    VITALS:   Vitals:   12/26/22 1501  BP: 126/74  Pulse: 73  SpO2: 98%  Weight: 201 lb 12.8 oz (91.5 kg)  Height: 5\' 10"  (1.778 m)      GEN:  The patient appears stated age and is in NAD. HEENT:  Normocephalic, atraumatic.  The mucous membranes are moist. The superficial temporal arteries are without ropiness or  tenderness.   Neurological examination:  Orientation: The patient is alert and oriented x3. Cranial nerves: There is good facial symmetry. The speech is fluent and clear. Soft palate rises symmetrically and there is no tongue deviation. Hearing is intact to conversational tone. Sensation: Sensation is intact to light touch throughout Motor: Strength is at least antigravity x4.  Movement examination: Tone: There is normal tone in the bilateral upper extremities.  The tone in the lower extremities is normal.  Abnormal movements: No rest tremor.  There is only mild postural tremor today, but intention tremor is moderate on the right.  He has trouble with Archimedes spirals, right greater than left.  Coordination:  There is no decremation with RAM's, with any form of RAMS, including  alternating supination and pronation of the forearm, hand opening and closing, finger taps, heel taps and toe taps. Gait and Station: The patient has no difficulty arising out of a deep-seated chair without the use of the hands. The patient's stride length is good with antalgic gait because 1 leg is shorter than the other (uses a cane as well).  This is stable from prior visit. I have reviewed and interpreted the following labs independently   Chemistry      Component Value Date/Time   NA 133 (L) 11/14/2022 1544   NA 139 05/14/2019 1412   K 4.1 11/14/2022 1544   CL 99 11/14/2022 1544   CO2 24 11/14/2022 1544   BUN 15 11/14/2022 1544   BUN 14 05/14/2019 1412   CREATININE 0.75 11/14/2022 1544   CREATININE 0.87 02/25/2016 1227      Component Value Date/Time   CALCIUM 9.1 11/14/2022 1544   ALKPHOS 56 11/14/2022 1544   AST 41 (H) 11/14/2022 1544   ALT 24 11/14/2022 1544   BILITOT 0.4 11/14/2022 1544   BILITOT 0.4 05/14/2019 1412      Lab Results  Component Value Date   WBC 7.1 11/14/2022   HGB 13.3 11/14/2022   HCT 41.7 11/14/2022   MCV 89.9 11/14/2022   PLT 317.0 11/14/2022   Lab Results  Component Value Date   TSH 1.62 11/14/2022     Chemistry      Component Value Date/Time   NA 133 (L) 11/14/2022 1544   NA 139 05/14/2019 1412   K 4.1 11/14/2022 1544   CL 99 11/14/2022 1544   CO2 24 11/14/2022 1544   BUN 15 11/14/2022 1544   BUN 14 05/14/2019 1412   CREATININE 0.75 11/14/2022 1544   CREATININE 0.87 02/25/2016 1227      Component Value Date/Time   CALCIUM 9.1 11/14/2022 1544   ALKPHOS 56 11/14/2022 1544   AST 41 (H) 11/14/2022 1544   ALT 24 11/14/2022 1544   BILITOT 0.4 11/14/2022 1544   BILITOT 0.4 05/14/2019 1412         Total time spent on today's visit was 20 minutes, including both face-to-face time and nonface-to-face time.  Time included that spent on review of records (prior notes available to me/labs/imaging if pertinent), discussing treatment and  goals, answering patient's questions and coordinating care (most of visit was spent on phone trying to coordinate trial information).  Cc:  Etta Grandchild, MD

## 2022-12-26 ENCOUNTER — Ambulatory Visit: Payer: PPO | Admitting: Neurology

## 2022-12-26 ENCOUNTER — Encounter: Payer: Self-pay | Admitting: Neurology

## 2022-12-26 VITALS — BP 126/74 | HR 73 | Ht 70.0 in | Wt 201.8 lb

## 2022-12-26 DIAGNOSIS — G250 Essential tremor: Secondary | ICD-10-CM | POA: Diagnosis not present

## 2022-12-26 NOTE — Patient Instructions (Addendum)
Continue your medications.  We can increase the propranolol and/or primidone in the future.    The physicians and staff at [facility] are committed to providing excellent care. You may receive a survey requesting feedback about your experience at our office. We strive to receive "very good" responses to the survey questions. If you feel that your experience would prevent you from giving the office a "very good " response, please contact our office to try to remedy the situation. We may be reached at [phone]. Thank you for taking the time out of your busy day to complete the survey.

## 2022-12-31 ENCOUNTER — Other Ambulatory Visit: Payer: Self-pay | Admitting: Internal Medicine

## 2022-12-31 DIAGNOSIS — E785 Hyperlipidemia, unspecified: Secondary | ICD-10-CM

## 2023-01-18 ENCOUNTER — Ambulatory Visit
Admission: EM | Admit: 2023-01-18 | Discharge: 2023-01-18 | Disposition: A | Discharge status: Home / Self Care | Payer: PPO | Attending: Physician Assistant | Admitting: Physician Assistant

## 2023-01-18 DIAGNOSIS — H6122 Impacted cerumen, left ear: Secondary | ICD-10-CM | POA: Diagnosis not present

## 2023-01-18 NOTE — ED Triage Notes (Signed)
Pt presents with c/o lt ear fullness X 4 days. States he got water in his ear and tried to get water out by using a q tip.

## 2023-01-18 NOTE — Discharge Instructions (Addendum)
Return if any problems.

## 2023-01-19 NOTE — ED Provider Notes (Signed)
[facility]    CSN: [id number] Arrival date & time: 01/18/23  1601      History   Chief Complaint Chief Complaint  Patient presents with   Ear Fullness    HPI Jesse Bell is a 62 y.o. male.   Patient complains of his left ear being full of wax.  Patient reports no complaint with his right ear.  Patient denies any fever or chills he has not had any cough or congestion.  Patient reports he has been using Q-tips without ability to remove wax  The history is provided by the patient. No language interpreter was used.  Ear Fullness    Past Medical History:  Diagnosis Date   Allergy    Back pain    Hyperlipidemia    Hypertension    Hypertriglyceridemia    Hypothyroid     Patient Active Problem List   Diagnosis Date Noted   High serum lipoprotein(a) 11/17/2022   Tremor due to disorder of central nervous system 03/01/2022   Screening for prostate cancer 03/01/2022   Mallet finger of right hand 01/26/2022   Acquired hypothyroidism 08/09/2020   Mild intermittent asthma without complication 08/05/2020   Epidural lipomatosis 04/29/2015   Lumbar radiculopathy 03/25/2015   Hypertension 05/03/2012   Back pain 05/03/2012    Past Surgical History:  Procedure Laterality Date   FRACTURE SURGERY     x6   KNEE SURGERY         Home Medications    Prior to Admission medications   Medication Sig Start Date End Date Taking? Authorizing Provider  albuterol (VENTOLIN HFA) 108 (90 Base) MCG/ACT inhaler Inhale 2 puffs into the lungs every 6 (six) hours as needed for wheezing or shortness of breath (cough, shortness of breath or wheezing.). 03/01/22   Etta Grandchild, MD  aspirin EC 81 MG tablet Take 1 tablet (81 mg total) by mouth daily. Swallow whole. 11/17/22   Etta Grandchild, MD  Fluticasone-Umeclidin-Vilant (TRELEGY ELLIPTA) 100-62.5-25 MCG/ACT AEPB Inhale 1 puff into the lungs daily. 11/14/22   Etta Grandchild, MD  ipratropium (ATROVENT) 0.03 % nasal spray Place 2  sprays into both nostrils 2 (two) times daily as needed for rhinitis. 06/29/21   Etta Grandchild, MD  levothyroxine (SYNTHROID) 25 MCG tablet TAKE 1 TABLET(25 MCG) BY MOUTH DAILY 12/03/22   Etta Grandchild, MD  olmesartan (BENICAR) 20 MG tablet TAKE 1 TABLET BY MOUTH EVERY DAY 11/15/22   Etta Grandchild, MD  primidone (MYSOLINE) 50 MG tablet 3 in the AM, 2 in the afternoon and 2 in the evening 12/13/22   Tat, Rebecca S, DO  propranolol ER (INDERAL LA) 60 MG 24 hr capsule TAKE 1 CAPSULE BY MOUTH EVERY DAY 11/20/22   Etta Grandchild, MD  rosuvastatin (CRESTOR) 40 MG tablet TAKE 1 TABLET BY MOUTH EVERY DAY 12/31/22   Etta Grandchild, MD  traZODone (DESYREL) 50 MG tablet TAKE 1/2 TO 3 TABLETS(25 TO 150 MG) BY MOUTH AT BEDTIME AS NEEDED FOR SLEEP 04/25/21   Janeece Agee, NP    Family History Family History  Problem Relation Age of Onset   Congestive Heart Failure Mother    Pulmonary fibrosis Father    Pulmonary fibrosis Brother        lung transplant   Heart Problems Brother    Skin cancer Brother    Macular degeneration Brother     Social History Social History   Tobacco Use   Smoking status: Never  Smokeless tobacco: Never  Vaping Use   Vaping status: Never Used  Substance Use Topics   Alcohol use: Not Currently    Alcohol/week: 1.0 - 2.0 standard drink of alcohol    Types: 1 - 2 Cans of beer per week    Comment: 1-2 times a week    Drug use: No     Allergies   Lisinopril   Review of Systems Review of Systems  All other systems reviewed and are negative.    Physical Exam Triage Vital Signs ED Triage Vitals [01/18/23 1616]  Encounter Vitals Group     BP 129/85     Systolic BP Percentile      Diastolic BP Percentile      Pulse Rate 66     Resp 17     Temp 97.7 F (36.5 C)     Temp Source Oral     SpO2 95 %     Weight      Height      Head Circumference      Peak Flow      Pain Score 5     Pain Loc      Pain Education      Exclude from Growth Chart    No  data found.  Updated Vital Signs BP 129/85 (BP Location: Right Arm)   Pulse 66   Temp 97.7 F (36.5 C) (Oral)   Resp 17   SpO2 95%   Visual Acuity Right Eye Distance:   Left Eye Distance:   Bilateral Distance:    Right Eye Near:   Left Eye Near:    Bilateral Near:     Physical Exam Vitals and nursing note reviewed.  Constitutional:      Appearance: He is well-developed.  HENT:     Head: Normocephalic.     Left Ear: There is impacted cerumen.  Cardiovascular:     Rate and Rhythm: Normal rate.  Pulmonary:     Effort: Pulmonary effort is normal.  Abdominal:     General: There is no distension.  Musculoskeletal:        General: Normal range of motion.     Cervical back: Normal range of motion.  Skin:    General: Skin is warm.  Neurological:     General: No focal deficit present.     Mental Status: He is alert and oriented to person, place, and time.      UC Treatments / Results  Labs (all labs ordered are listed, but only abnormal results are displayed) Labs Reviewed - No data to display  EKG   Radiology No results found.  Procedures Procedures (including critical care time)  Medications Ordered in UC Medications - No data to display  Initial Impression / Assessment and Plan / UC Course  I have reviewed the triage vital signs and the nursing notes.  Pertinent labs & imaging results that were available during my care of the patient were reviewed by me and considered in my medical decision making (see chart for details).     Left ear canal irrigated until clear patient reports feeling much better Final Clinical Impressions(s) / UC Diagnoses   Final diagnoses:  Hearing loss due to cerumen impaction, left     Discharge Instructions      Return if any problems.    ED Prescriptions   None    PDMP not reviewed this encounter. An After Visit Summary was printed and given to the patient.  Elson Areas, [place] 01/19/23 1513

## 2023-01-29 ENCOUNTER — Other Ambulatory Visit: Payer: Self-pay | Admitting: Internal Medicine

## 2023-01-29 DIAGNOSIS — J4520 Mild intermittent asthma, uncomplicated: Secondary | ICD-10-CM

## 2023-02-02 ENCOUNTER — Telehealth: Payer: Self-pay | Admitting: Internal Medicine

## 2023-02-02 NOTE — Telephone Encounter (Signed)
Caller & Relationship to patient: Self  Call back number: [phone]   Date of last office visit: 8.6.24  Date of next office visit: N/A  Medication(s) to be refilled:  traZODone (DESYREL) 50 MG tablet   Preferred Pharmacy:  [facility] [phone]  Phone: [phone]  Fax: [phone]   Pt is aware that this medication was originally prescribed by another provider, but the pt is requesting we fill the medication for him. States he takes it very infrequently and he has stopped drinking.

## 2023-02-05 ENCOUNTER — Other Ambulatory Visit: Payer: Self-pay

## 2023-02-05 DIAGNOSIS — G479 Sleep disorder, unspecified: Secondary | ICD-10-CM

## 2023-02-05 MED ORDER — TRAZODONE HCL 50 MG PO TABS
50.0000 mg | ORAL_TABLET | Freq: Every evening | ORAL | 1 refills | Status: DC | PRN
Start: 2023-02-05 — End: 2023-08-21

## 2023-02-05 NOTE — Telephone Encounter (Signed)
 Medication refill sent to Dr. Yetta Barre

## 2023-02-13 ENCOUNTER — Other Ambulatory Visit: Payer: Self-pay

## 2023-02-13 ENCOUNTER — Telehealth: Payer: Self-pay | Admitting: Internal Medicine

## 2023-02-13 DIAGNOSIS — I10 Essential (primary) hypertension: Secondary | ICD-10-CM

## 2023-02-13 MED ORDER — OLMESARTAN MEDOXOMIL 20 MG PO TABS
20.0000 mg | ORAL_TABLET | Freq: Every day | ORAL | 0 refills | Status: DC
Start: 2023-02-13 — End: 2023-05-06

## 2023-02-13 NOTE — Telephone Encounter (Signed)
Prescription Request  02/13/2023  LOV: 11/14/2022  What is the name of the medication or equipment? olmesartan  Have you contacted your pharmacy to request a refill? Yes   Which pharmacy would you like this sent to?  [id number] [place], [place] - [address] AT [facility] [address] [place] [address] Phone: [phone] Fax: [phone]    Patient notified that their request is being sent to the clinical staff for review and that they should receive a response within 2 business days.   Please advise at Mobile [phone] (mobile)

## 2023-02-13 NOTE — Telephone Encounter (Signed)
 Medication refill has been sent to Dr. Yetta Barre

## 2023-02-28 ENCOUNTER — Telehealth: Payer: Self-pay | Admitting: Neurology

## 2023-02-28 NOTE — Telephone Encounter (Signed)
Pt needs a refill on his Primidone. He is out of medication. [facility] [address]

## 2023-03-01 ENCOUNTER — Other Ambulatory Visit: Payer: Self-pay

## 2023-03-01 DIAGNOSIS — R251 Tremor, unspecified: Secondary | ICD-10-CM

## 2023-03-01 MED ORDER — PRIMIDONE 50 MG PO TABS: ORAL_TABLET | ORAL | 0 refills | Status: DC

## 2023-03-01 NOTE — Telephone Encounter (Signed)
Refill sent. Sent patient a [facility] geologist

## 2023-03-06 ENCOUNTER — Other Ambulatory Visit: Payer: Self-pay

## 2023-03-06 ENCOUNTER — Telehealth: Payer: Self-pay | Admitting: Internal Medicine

## 2023-03-06 DIAGNOSIS — G969 Disorder of central nervous system, unspecified: Secondary | ICD-10-CM

## 2023-03-06 DIAGNOSIS — I10 Essential (primary) hypertension: Secondary | ICD-10-CM

## 2023-03-06 MED ORDER — PROPRANOLOL HCL ER 60 MG PO CP24: 60.0000 mg | ORAL_CAPSULE | Freq: Every day | ORAL | 0 refills | Status: DC

## 2023-03-06 NOTE — Telephone Encounter (Signed)
Medication has been refilled at the patients local pharmacy.

## 2023-03-06 NOTE — Telephone Encounter (Signed)
Prescription Request  03/06/2023  LOV: 11/14/2022  What is the name of the medication or equipment?  propranolol ER (INDERAL LA) 60 MG 24 hr capsule  Have you contacted your pharmacy to request a refill? Yes   Which pharmacy would you like this sent to?  [id number] [place], [place] - [address] AT [facility] [address] [place] [address] Phone: [phone] Fax: [phone]    Patient notified that their request is being sent to the clinical staff for review and that they should receive a response within 2 business days.   Please advise at Mobile [phone] (mobile)

## 2023-03-15 ENCOUNTER — Telehealth: Payer: Self-pay | Admitting: Neurology

## 2023-03-15 NOTE — Telephone Encounter (Signed)
Pt called in stating he was given a brochure with treatment options. He is interested in the sound wave treatment. He would like to find out how to get that started?

## 2023-03-16 ENCOUNTER — Telehealth: Payer: Self-pay | Admitting: Neurology

## 2023-03-16 NOTE — Telephone Encounter (Signed)
Pt wants to speak with someone about the medication and not doing the ultrasound at this time

## 2023-03-16 NOTE — Telephone Encounter (Signed)
Called patient back and he was so confused and had procedures completely backwards patient does want focused ultrasound once I described the procedure for DBS

## 2023-03-16 NOTE — Telephone Encounter (Signed)
Patient said when I called that he didn't mean the Focused Ultrasound he meant to say the surgery we do here the DBS surgery

## 2023-03-26 ENCOUNTER — Telehealth: Payer: Self-pay | Admitting: Internal Medicine

## 2023-03-26 ENCOUNTER — Other Ambulatory Visit: Payer: Self-pay

## 2023-03-26 DIAGNOSIS — E039 Hypothyroidism, unspecified: Secondary | ICD-10-CM

## 2023-03-26 MED ORDER — LEVOTHYROXINE SODIUM 25 MCG PO TABS: ORAL_TABLET | ORAL | 0 refills | Status: DC

## 2023-03-26 NOTE — Telephone Encounter (Signed)
 Medication refill sent to Dr. Yetta Barre

## 2023-03-26 NOTE — Telephone Encounter (Signed)
Prescription Request  03/26/2023  LOV: 11/14/2022  What is the name of the medication or equipment? levothyroxine  Have you contacted your pharmacy to request a refill? Yes   Which pharmacy would you like this sent to?  [id number] [place], [place] - [address] AT [facility] OF [address] [place] [address] Phone: [phone] Fax: [phone]    Patient notified that their request is being sent to the clinical staff for review and that they should receive a response within 2 business days.   Please advise at Mobile [phone] (mobile)

## 2023-05-05 ENCOUNTER — Other Ambulatory Visit: Payer: Self-pay | Admitting: Internal Medicine

## 2023-05-05 DIAGNOSIS — J4520 Mild intermittent asthma, uncomplicated: Secondary | ICD-10-CM

## 2023-05-06 ENCOUNTER — Other Ambulatory Visit: Payer: Self-pay | Admitting: Internal Medicine

## 2023-05-06 DIAGNOSIS — I10 Essential (primary) hypertension: Secondary | ICD-10-CM

## 2023-06-02 ENCOUNTER — Other Ambulatory Visit: Payer: Self-pay | Admitting: Internal Medicine

## 2023-06-02 DIAGNOSIS — I10 Essential (primary) hypertension: Secondary | ICD-10-CM

## 2023-06-02 DIAGNOSIS — R251 Tremor, unspecified: Secondary | ICD-10-CM

## 2023-06-18 ENCOUNTER — Other Ambulatory Visit: Payer: Self-pay | Admitting: Internal Medicine

## 2023-06-18 DIAGNOSIS — E039 Hypothyroidism, unspecified: Secondary | ICD-10-CM

## 2023-06-18 DIAGNOSIS — E785 Hyperlipidemia, unspecified: Secondary | ICD-10-CM

## 2023-06-21 ENCOUNTER — Other Ambulatory Visit: Payer: Self-pay | Admitting: Internal Medicine

## 2023-06-21 DIAGNOSIS — E039 Hypothyroidism, unspecified: Secondary | ICD-10-CM

## 2023-06-27 NOTE — Progress Notes (Addendum)
 Assessment/Plan:    1.  Essential Tremor  -Continue primidone, 50 mg, 3 in the AM, 2 in the afternoon and 2 in the evening  -Patient on Inderal LA, 60 mg daily by primary care for blood pressure and tremor.  Discussed that we can try and change to propranolol 40 mg bid.  He decided to hold off on that change for now.  -Also told the patient that we could add in as needed IR propranolol, 10-20 mg.  He thinks doing okay right now  -Discussed DBS again.  Discussed focused ultrasound again.  He has fairly significant tremor on the right>left.  We discussed that he does have health team advantage and he cannot go to [facility] for this procedure without a lot of exceptions, which could take a few months.  He doesn't want to proceed  -Berniece Andreas Trio is being considered as an alternative.  Pt has tried/failed at least 2 tremor medications, as required by Medicare.  Patients [employer] Tremor ADL form completed today and ability to cut food with a knife and fork, using a spoon was greater than or equal to 3.     Subjective:   Jesse Bell was seen today in follow up for tremor.  Patient continues to take his primidone and propranolol LA.  Thus far, he has not been interested in surgical interventions.  However, he did call after our last visit about interest in focused ultrasound, but then ultimately decided to hold on that, mostly b/c he would have to go out of town to have it done.  He has more tremor in the R than the L.  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  It doesn't seem to make him sleepy at half the dosage.  He has had no falls.    Current prescribed movement disorder medications: Primidone, 50 mg, 3 in the morning, 2 at noon and 2 at bedtime  Inderal LA, 60 mg daily (prescribed by primary care.   ALLERGIES:   Allergies  Allergen Reactions   Lisinopril Cough    COUGH    CURRENT MEDICATIONS:  Current Meds  Medication Sig   albuterol (VENTOLIN HFA) 108  (90 Base) MCG/ACT inhaler Inhale 2 puffs into the lungs every 6 (six) hours as needed for wheezing or shortness of breath (cough, shortness of breath or wheezing.).   aspirin EC 81 MG tablet Take 1 tablet (81 mg total) by mouth daily. Swallow whole.   ipratropium (ATROVENT) 0.03 % nasal spray Place 2 sprays into both nostrils 2 (two) times daily as needed for rhinitis.   levothyroxine (SYNTHROID) 25 MCG tablet TAKE 1 TABLET(25 MCG) BY MOUTH DAILY   olmesartan (BENICAR) 20 MG tablet TAKE 1 TABLET BY MOUTH EVERY DAY   primidone (MYSOLINE) 50 MG tablet 3 in the AM, 2 in the afternoon and 2 in the evening   propranolol ER (INDERAL LA) 60 MG 24 hr capsule TAKE 1 CAPSULE BY MOUTH EVERY DAY   rosuvastatin (CRESTOR) 40 MG tablet TAKE 1 TABLET BY MOUTH EVERY DAY   traZODone (DESYREL) 50 MG tablet Take 1 tablet (50 mg total) by mouth at bedtime as needed for sleep.   TRELEGY ELLIPTA 100-62.5-25 MCG/ACT AEPB TAKE 1 PUFF BY MOUTH EVERY DAY      Objective:    PHYSICAL EXAMINATION:    VITALS:   Vitals:   06/29/23 1250  BP: 126/82  Pulse: 69  SpO2: 98%  Weight: 206  lb 3.2 oz (93.5 kg)    GEN:  The patient appears stated age and is in NAD. HEENT:  Normocephalic, atraumatic.  The mucous membranes are moist. The superficial temporal arteries are without ropiness or tenderness. CV:  RRR Lungs:  CTAB   Neurological examination:  Orientation: The patient is alert and oriented x3. Cranial nerves: There is good facial symmetry. The speech is fluent and clear. Soft palate rises symmetrically and there is no tongue deviation. Hearing is intact to conversational tone. Sensation: Sensation is intact to light touch throughout Motor: Strength is at least antigravity x4.  Movement examination: Tone: There is normal tone in the bilateral upper extremities.  The tone in the lower extremities is normal.  Abnormal movements: No rest tremor.  There is mod postural and intention tremor on the R.  Trouble  getting pen to paper for archimedes spirals on the R     Coordination:  There is no decremation with RAM's, with any form of RAMS, including alternating supination and pronation of the forearm, hand opening and closing, finger taps, heel taps and toe taps. Gait and Station: The patient has no difficulty arising out of a deep-seated chair without the use of the hands. The patient's stride length is good with antalgic gait because 1 leg is shorter than the other (uses a cane as well).  This is stable from prior visit. I have reviewed and interpreted the following labs independently   Chemistry      Component Value Date/Time   NA 133 (L) 11/14/2022 1544   NA 139 05/14/2019 1412   K 4.1 11/14/2022 1544   CL 99 11/14/2022 1544   CO2 24 11/14/2022 1544   BUN 15 11/14/2022 1544   BUN 14 05/14/2019 1412   CREATININE 0.75 11/14/2022 1544   CREATININE 0.87 02/25/2016 1227      Component Value Date/Time   CALCIUM 9.1 11/14/2022 1544   ALKPHOS 56 11/14/2022 1544   AST 41 (H) 11/14/2022 1544   ALT 24 11/14/2022 1544   BILITOT 0.4 11/14/2022 1544   BILITOT 0.4 05/14/2019 1412      Lab Results  Component Value Date   WBC 7.1 11/14/2022   HGB 13.3 11/14/2022   HCT 41.7 11/14/2022   MCV 89.9 11/14/2022   PLT 317.0 11/14/2022   Lab Results  Component Value Date   TSH 1.62 11/14/2022     Chemistry      Component Value Date/Time   NA 133 (L) 11/14/2022 1544   NA 139 05/14/2019 1412   K 4.1 11/14/2022 1544   CL 99 11/14/2022 1544   CO2 24 11/14/2022 1544   BUN 15 11/14/2022 1544   BUN 14 05/14/2019 1412   CREATININE 0.75 11/14/2022 1544   CREATININE 0.87 02/25/2016 1227      Component Value Date/Time   CALCIUM 9.1 11/14/2022 1544   ALKPHOS 56 11/14/2022 1544   AST 41 (H) 11/14/2022 1544   ALT 24 11/14/2022 1544   BILITOT 0.4 11/14/2022 1544   BILITOT 0.4 05/14/2019 1412         Total time spent on today's visit was 30 minutes, including both face-to-face time and  nonface-to-face time.  Time included that spent on review of records (prior notes available to me/labs/imaging if pertinent), discussing treatment and goals, answering patient's questions and coordinating care (most of visit was spent on phone trying to coordinate trial information).  Cc:  Etta Grandchild, MD

## 2023-06-29 ENCOUNTER — Encounter: Payer: Self-pay | Admitting: Neurology

## 2023-06-29 ENCOUNTER — Ambulatory Visit: Payer: PPO | Admitting: Neurology

## 2023-06-29 VITALS — BP 126/82 | HR 69 | Wt 206.2 lb

## 2023-06-29 DIAGNOSIS — G250 Essential tremor: Secondary | ICD-10-CM | POA: Diagnosis not present

## 2023-06-29 NOTE — Patient Instructions (Signed)
 We will attempt to get [facility].  If they do send to you, let me know so I can schedule an earlier follow up visit.

## 2023-06-30 ENCOUNTER — Other Ambulatory Visit: Payer: Self-pay | Admitting: Internal Medicine

## 2023-06-30 DIAGNOSIS — J4520 Mild intermittent asthma, uncomplicated: Secondary | ICD-10-CM

## 2023-07-19 ENCOUNTER — Ambulatory Visit: Admitting: Internal Medicine

## 2023-07-19 ENCOUNTER — Encounter: Payer: Self-pay | Admitting: Internal Medicine

## 2023-07-19 VITALS — BP 118/78 | HR 59 | Temp 98.2°F | Resp 16 | Ht 70.0 in | Wt 197.8 lb

## 2023-07-19 DIAGNOSIS — E039 Hypothyroidism, unspecified: Secondary | ICD-10-CM | POA: Diagnosis not present

## 2023-07-19 DIAGNOSIS — Z125 Encounter for screening for malignant neoplasm of prostate: Secondary | ICD-10-CM | POA: Diagnosis not present

## 2023-07-19 DIAGNOSIS — E781 Pure hyperglyceridemia: Secondary | ICD-10-CM | POA: Diagnosis not present

## 2023-07-19 DIAGNOSIS — Z0001 Encounter for general adult medical examination with abnormal findings: Secondary | ICD-10-CM

## 2023-07-19 DIAGNOSIS — J4520 Mild intermittent asthma, uncomplicated: Secondary | ICD-10-CM

## 2023-07-19 DIAGNOSIS — R001 Bradycardia, unspecified: Secondary | ICD-10-CM

## 2023-07-19 DIAGNOSIS — Z0000 Encounter for general adult medical examination without abnormal findings: Secondary | ICD-10-CM

## 2023-07-19 DIAGNOSIS — Z23 Encounter for immunization: Secondary | ICD-10-CM

## 2023-07-19 DIAGNOSIS — I10 Essential (primary) hypertension: Secondary | ICD-10-CM

## 2023-07-19 LAB — CBC WITH DIFFERENTIAL/PLATELET
Basophils Absolute: 0.1 10*3/uL (ref 0.0–0.1)
Basophils Relative: 1.2 % (ref 0.0–3.0)
Eosinophils Absolute: 0.2 10*3/uL (ref 0.0–0.7)
Eosinophils Relative: 2.3 % (ref 0.0–5.0)
HCT: 41.5 % (ref 39.0–52.0)
Hemoglobin: 13.6 g/dL (ref 13.0–17.0)
Lymphocytes Relative: 18.7 % (ref 12.0–46.0)
Lymphs Abs: 1.6 10*3/uL (ref 0.7–4.0)
MCHC: 32.8 g/dL (ref 30.0–36.0)
MCV: 86.3 fl (ref 78.0–100.0)
Monocytes Absolute: 0.6 10*3/uL (ref 0.1–1.0)
Monocytes Relative: 7.1 % (ref 3.0–12.0)
Neutro Abs: 6.1 10*3/uL (ref 1.4–7.7)
Neutrophils Relative %: 70.7 % (ref 43.0–77.0)
Platelets: 426 10*3/uL — ABNORMAL HIGH (ref 150.0–400.0)
RBC: 4.81 Mil/uL (ref 4.22–5.81)
RDW: 13.1 % (ref 11.5–15.5)
WBC: 8.6 10*3/uL (ref 4.0–10.5)

## 2023-07-19 LAB — HEPATIC FUNCTION PANEL
ALT: 13 U/L (ref 0–53)
AST: 20 U/L (ref 0–37)
Albumin: 4.8 g/dL (ref 3.5–5.2)
Alkaline Phosphatase: 62 U/L (ref 39–117)
Bilirubin, Direct: 0.1 mg/dL (ref 0.0–0.3)
Total Bilirubin: 0.4 mg/dL (ref 0.2–1.2)
Total Protein: 7.3 g/dL (ref 6.0–8.3)

## 2023-07-19 LAB — URINALYSIS, ROUTINE W REFLEX MICROSCOPIC
Bilirubin Urine: NEGATIVE
Hgb urine dipstick: NEGATIVE
Ketones, ur: NEGATIVE
Leukocytes,Ua: NEGATIVE
Nitrite: NEGATIVE
Specific Gravity, Urine: 1.01 (ref 1.000–1.030)
Total Protein, Urine: NEGATIVE
Urine Glucose: NEGATIVE
Urobilinogen, UA: 0.2 (ref 0.0–1.0)
pH: 7 (ref 5.0–8.0)

## 2023-07-19 LAB — BASIC METABOLIC PANEL WITH GFR
BUN: 12 mg/dL (ref 6–23)
CO2: 28 meq/L (ref 19–32)
Calcium: 9.2 mg/dL (ref 8.4–10.5)
Chloride: 100 meq/L (ref 96–112)
Creatinine, Ser: 0.62 mg/dL (ref 0.40–1.50)
GFR: 101.86 mL/min (ref >60.00)
Glucose, Bld: 103 mg/dL — ABNORMAL HIGH (ref 70–99)
Potassium: 5 meq/L (ref 3.5–5.1)
Sodium: 136 meq/L (ref 135–145)

## 2023-07-19 LAB — TSH: TSH: 1.85 u[IU]/mL (ref 0.35–5.50)

## 2023-07-19 LAB — LIPID PANEL
Cholesterol: 191 mg/dL (ref 0–200)
HDL: 50.2 mg/dL (ref >39.00)
LDL Cholesterol: 119 mg/dL — ABNORMAL HIGH (ref 0–99)
NonHDL: 141.04
Total CHOL/HDL Ratio: 4
Triglycerides: 112 mg/dL (ref 0.0–149.0)
VLDL: 22.4 mg/dL (ref 0.0–40.0)

## 2023-07-19 LAB — PSA: PSA: 0.12 ng/mL (ref 0.10–4.00)

## 2023-07-19 NOTE — Progress Notes (Unsigned)
 Subjective:  Patient ID: Jesse Bell, male    DOB: May 12, 1960  Age: 63 y.o. MRN: [id number]  CC: Annual Exam, Hyperlipidemia, Hypertension, and Hypothyroidism   HPI Jesse Bell presents for a CPX and f/up ----  Discussed the use of AI scribe software for clinical note transcription with the patient, who gave verbal consent to proceed.  History of Present Illness   Jesse Bell is a 63 year old male with hypertension who presents with low blood pressure after taking both blood pressure medications in the morning.  He experienced low blood pressure after taking both of his blood pressure medications, propranolol and Olmesartan, in the morning instead of his usual routine of taking one in the afternoon. This change in his medication schedule occurred because he has not eaten yet today, which is atypical for him as he usually takes one of the medications after eating.  No weakness, dizziness, lightheadedness, chest pain, shortness of breath, or breathing difficulties. No symptoms suggestive of thyroid dysfunction, such as constipation or diarrhea. No swelling in his legs or feet.       Outpatient Medications Prior to Visit  Medication Sig Dispense Refill   albuterol (VENTOLIN HFA) 108 (90 Base) MCG/ACT inhaler Inhale 2 puffs into the lungs every 6 (six) hours as needed for wheezing or shortness of breath (cough, shortness of breath or wheezing.). 18 g 3   aspirin EC 81 MG tablet Take 1 tablet (81 mg total) by mouth daily. Swallow whole. 90 tablet 1   ipratropium (ATROVENT) 0.03 % nasal spray Place 2 sprays into both nostrils 2 (two) times daily as needed for rhinitis. 90 mL 1   primidone (MYSOLINE) 50 MG tablet 3 in the AM, 2 in the afternoon and 2 in the evening 630 tablet 0   propranolol ER (INDERAL LA) 60 MG 24 hr capsule TAKE 1 CAPSULE BY MOUTH EVERY DAY 90 capsule 0   rosuvastatin (CRESTOR) 40 MG tablet TAKE 1 TABLET BY MOUTH EVERY DAY 90 tablet 1   traZODone (DESYREL) 50 MG tablet Take  1 tablet (50 mg total) by mouth at bedtime as needed for sleep. 90 tablet 1   TRELEGY ELLIPTA 100-62.5-25 MCG/ACT AEPB TAKE 1 PUFF BY MOUTH EVERY DAY 60 each 1   levothyroxine (SYNTHROID) 25 MCG tablet TAKE 1 TABLET(25 MCG) BY MOUTH DAILY 90 tablet 0   olmesartan (BENICAR) 20 MG tablet TAKE 1 TABLET BY MOUTH EVERY DAY 90 tablet 0   No facility-administered medications prior to visit.    ROS Review of Systems  Constitutional: Negative.  Negative for appetite change, chills, diaphoresis, fatigue and fever.  HENT: Negative.    Eyes: Negative.   Respiratory: Negative.  Negative for cough, chest tightness, shortness of breath and wheezing.   Cardiovascular:  Negative for chest pain, palpitations and leg swelling.  Gastrointestinal:  Negative for abdominal pain, blood in stool, constipation, diarrhea, nausea and vomiting.  Genitourinary: Negative.  Negative for difficulty urinating.  Musculoskeletal:  Positive for gait problem. Negative for arthralgias, back pain and myalgias.  Neurological:  Negative for dizziness, syncope, weakness and light-headedness.  Hematological:  Negative for adenopathy. Does not bruise/bleed easily.  Psychiatric/Behavioral: Negative.      Objective:  BP 118/78 (BP Location: Left Arm, Patient Position: Sitting, Cuff Size: Normal) Comment: BP (R) 122/82  Pulse (!) 59   Temp 98.2 F (36.8 C) (Oral)   Resp 16   Ht 5\' 10"  (1.778 m)   Wt 197 lb 12.8 oz (89.7 kg)  SpO2 99%   BMI 28.38 kg/m   BP Readings from Last 3 Encounters:  07/19/23 118/78  06/29/23 126/82  01/18/23 129/85    Wt Readings from Last 3 Encounters:  07/19/23 197 lb 12.8 oz (89.7 kg)  06/29/23 206 lb 3.2 oz (93.5 kg)  12/26/22 201 lb 12.8 oz (91.5 kg)    Physical Exam Vitals reviewed.  Constitutional:      Appearance: Normal appearance.  HENT:     Nose: Nose normal.     Mouth/Throat:     Mouth: Mucous membranes are moist.  Eyes:     General: No scleral icterus.     Conjunctiva/sclera: Conjunctivae normal.  Cardiovascular:     Rate and Rhythm: Normal rate and regular rhythm.     Heart sounds: Normal heart sounds, S1 normal and S2 normal. No murmur heard.    No friction rub. No gallop.     Comments: EKG--- NSR, 61 bpm Incomplete RBBB No LVH or Q waves Improved  Pulmonary:     Effort: Pulmonary effort is normal.     Breath sounds: No stridor. No wheezing, rhonchi or rales.  Abdominal:     General: Abdomen is flat.     Palpations: There is no mass.     Tenderness: There is no abdominal tenderness. There is no guarding.     Hernia: No hernia is present.  Genitourinary:    Comments: GU/rectal deferred at his request Musculoskeletal:        General: Normal range of motion.     Cervical back: Neck supple.     Right lower leg: No edema.     Left lower leg: No edema.  Lymphadenopathy:     Cervical: No cervical adenopathy.  Skin:    General: Skin is dry.  Neurological:     General: No focal deficit present.     Mental Status: He is alert.  Psychiatric:        Mood and Affect: Mood normal.        Behavior: Behavior normal.     Lab Results  Component Value Date   WBC 8.6 07/19/2023   HGB 13.6 07/19/2023   HCT 41.5 07/19/2023   PLT 426.0 (H) 07/19/2023   GLUCOSE 103 (H) 07/19/2023   CHOL 191 07/19/2023   TRIG 112.0 07/19/2023   HDL 50.20 07/19/2023   LDLDIRECT 108.0 11/14/2022   LDLCALC 119 (H) 07/19/2023   ALT 13 07/19/2023   AST 20 07/19/2023   NA 136 07/19/2023   K 5.0 07/19/2023   CL 100 07/19/2023   CREATININE 0.62 07/19/2023   BUN 12 07/19/2023   CO2 28 07/19/2023   TSH 1.85 07/19/2023   PSA 0.12 07/19/2023    No results found.  Assessment & Plan:   Primary hypertension- His BP is over controlled. Will discontinue the ARB. -     CBC with Differential/Platelet; Future -     TSH; Future -     Hepatic function panel; Future -     Basic metabolic panel with GFR; Future -     Urinalysis, Routine w reflex microscopic;  Future  Acquired hypothyroidism- He [place] euthyroid -     TSH; Future -     Levothyroxine Sodium; Take 1 tablet (25 mcg total) by mouth daily before breakfast. TAKE 1 TABLET(25 MCG) BY MOUTH DAILY  Dispense: 90 tablet; Refill: 1  Mild intermittent asthma without complication -     CBC with Differential/Platelet; Future  Pure hyperglyceridemia -  Lipid panel; Future  Bradycardia- HR is normal on the EKG. -     EKG 12-Lead  Encounter for general adult medical examination with abnormal findings - Exam completed, labs reviewed, vaccines reviewed and updated, cancer screenings addressed, pt ed material was given.   Screening for prostate cancer -     PSA; Future     Follow-up: Return in about 6 months (around 01/18/2024).  Sanda Linger, MD

## 2023-07-19 NOTE — Patient Instructions (Signed)
 Health Maintenance, Male  Adopting a healthy lifestyle and getting preventive care are important in promoting health and wellness. Ask your health care provider about:  The right schedule for you to have regular tests and exams.  Things you can do on your own to prevent diseases and keep yourself healthy.  What should I know about diet, weight, and exercise?  Eat a healthy diet    Eat a diet that includes plenty of vegetables, fruits, low-fat dairy products, and lean protein.  Do not eat a lot of foods that are high in solid fats, added sugars, or sodium.  Maintain a healthy weight  Body mass index (BMI) is a measurement that can be used to identify possible weight problems. It estimates body fat based on height and weight. Your health care provider can help determine your BMI and help you achieve or maintain a healthy weight.  Get regular exercise  Get regular exercise. This is one of the most important things you can do for your health. Most adults should:  Exercise for at least 150 minutes each week. The exercise should increase your heart rate and make you sweat (moderate-intensity exercise).  Do strengthening exercises at least twice a week. This is in addition to the moderate-intensity exercise.  Spend less time sitting. Even light physical activity can be beneficial.  Watch cholesterol and blood lipids  Have your blood tested for lipids and cholesterol at 64 years of age, then have this test every 5 years.  You may need to have your cholesterol levels checked more often if:  Your lipid or cholesterol levels are high.  You are older than 64 years of age.  You are at high risk for heart disease.  What should I know about cancer screening?  Many types of cancers can be detected early and may often be prevented. Depending on your health history and family history, you may need to have cancer screening at various ages. This may include screening for:  Colorectal cancer.  Prostate cancer.  Skin cancer.  Lung  cancer.  What should I know about heart disease, diabetes, and high blood pressure?  Blood pressure and heart disease  High blood pressure causes heart disease and increases the risk of stroke. This is more likely to develop in people who have high blood pressure readings or are overweight.  Talk with your health care provider about your target blood pressure readings.  Have your blood pressure checked:  Every 3-5 years if you are 9-95 years of age.  Every year if you are 85 years old or older.  If you are between the ages of 29 and 29 and are a current or former smoker, ask your health care provider if you should have a one-time screening for abdominal aortic aneurysm (AAA).  Diabetes  Have regular diabetes screenings. This checks your fasting blood sugar level. Have the screening done:  Once every three years after age 23 if you are at a normal weight and have a low risk for diabetes.  More often and at a younger age if you are overweight or have a high risk for diabetes.  What should I know about preventing infection?  Hepatitis B  If you have a higher risk for hepatitis B, you should be screened for this virus. Talk with your health care provider to find out if you are at risk for hepatitis B infection.  Hepatitis C  Blood testing is recommended for:  Everyone born from [birth year] through 1966.  Anyone  with known risk factors for hepatitis C.  Sexually transmitted infections (STIs)  You should be screened each year for STIs, including gonorrhea and chlamydia, if:  You are sexually active and are younger than 64 years of age.  You are older than 64 years of age and your health care provider tells you that you are at risk for this type of infection.  Your sexual activity has changed since you were last screened, and you are at increased risk for chlamydia or gonorrhea. Ask your health care provider if you are at risk.  Ask your health care provider about whether you are at high risk for HIV. Your health care provider  may recommend a prescription medicine to help prevent HIV infection. If you choose to take medicine to prevent HIV, you should first get tested for HIV. You should then be tested every 3 months for as long as you are taking the medicine.  Follow these instructions at home:  Alcohol use  Do not drink alcohol if your health care provider tells you not to drink.  If you drink alcohol:  Limit how much you have to 0-2 drinks a day.  Know how much alcohol is in your drink. In the [place] one drink equals one 12 oz bottle of beer (355 mL), one 5 oz glass of wine (148 mL), or one 1 oz glass of hard liquor (44 mL).  Lifestyle  Do not use any products that contain nicotine or tobacco. These products include cigarettes, chewing tobacco, and vaping devices, such as e-cigarettes. If you need help quitting, ask your health care provider.  Do not use street drugs.  Do not share needles.  Ask your health care provider for help if you need support or information about quitting drugs.  General instructions  Schedule regular health, dental, and eye exams.  Stay current with your vaccines.  Tell your health care provider if:  You often feel depressed.  You have ever been abused or do not feel safe at home.  Summary  Adopting a healthy lifestyle and getting preventive care are important in promoting health and wellness.  Follow your health care provider's instructions about healthy diet, exercising, and getting tested or screened for diseases.  Follow your health care provider's instructions on monitoring your cholesterol and blood pressure.  This information is not intended to replace advice given to you by your health care provider. Make sure you discuss any questions you have with your health care provider.  Document Revised: 08/16/2020 Document Reviewed: 08/16/2020  Elsevier Patient Education  2024 [employer].

## 2023-07-20 DIAGNOSIS — Z23 Encounter for immunization: Secondary | ICD-10-CM | POA: Insufficient documentation

## 2023-07-20 MED ORDER — LEVOTHYROXINE SODIUM 25 MCG PO TABS: 25.0000 ug | ORAL_TABLET | Freq: Every day | ORAL | 1 refills | Status: DC

## 2023-07-20 MED ORDER — SHINGRIX 50 MCG/0.5ML IM SUSR: 0.5000 mL | Freq: Once | INTRAMUSCULAR | 1 refills | Status: AC

## 2023-07-31 ENCOUNTER — Other Ambulatory Visit: Payer: Self-pay | Admitting: Internal Medicine

## 2023-07-31 DIAGNOSIS — G479 Sleep disorder, unspecified: Secondary | ICD-10-CM

## 2023-08-01 ENCOUNTER — Other Ambulatory Visit: Payer: Self-pay | Admitting: Internal Medicine

## 2023-08-01 DIAGNOSIS — J4520 Mild intermittent asthma, uncomplicated: Secondary | ICD-10-CM

## 2023-08-01 MED ORDER — ALBUTEROL SULFATE HFA 108 (90 BASE) MCG/ACT IN AERS: 2.0000 | INHALATION_SPRAY | Freq: Four times a day (QID) | RESPIRATORY_TRACT | 3 refills | Status: AC | PRN

## 2023-08-01 NOTE — Telephone Encounter (Signed)
 Copied from CRM [id number]. Topic: Clinical - Medication Refill >> Aug 01, 2023  1:07 PM [place] E wrote: Most Recent Primary Care Visit:  Provider: Arcadio Knuckles  Department: [facility]  Visit Type: OFFICE VISIT  Date: 07/19/2023  Medication: albuterol  (VENTOLIN  HFA) 108 (90 Base) MCG/ACT inhaler  Has the patient contacted their pharmacy? No (Agent: If no, request that the patient contact the pharmacy for the refill. If patient does not wish to contact the pharmacy document the reason why and proceed with request.) (Agent: If yes, when and what did the pharmacy advise?)  Is this the correct pharmacy for this prescription? Yes If no, delete pharmacy and type the correct one.  This is the patient's preferred pharmacy:  [id number] [place], [place] - [address] AT [facility] [address] [place] [address] Phone: [phone] Fax: [phone]   Has the prescription been filled recently? No  Is the patient out of the medication? Yes  Has the patient been seen for an appointment in the last year OR does the patient have an upcoming appointment? Yes  Can we respond through MyChart? Yes  Agent: Please be advised that Rx refills may take up to 3 business days. We ask that you follow-up with your pharmacy.

## 2023-08-03 ENCOUNTER — Other Ambulatory Visit: Payer: Self-pay | Admitting: Internal Medicine

## 2023-08-03 DIAGNOSIS — I10 Essential (primary) hypertension: Secondary | ICD-10-CM

## 2023-08-21 ENCOUNTER — Other Ambulatory Visit: Payer: Self-pay

## 2023-09-01 ENCOUNTER — Other Ambulatory Visit: Payer: Self-pay | Admitting: Neurology

## 2023-09-01 ENCOUNTER — Other Ambulatory Visit: Payer: Self-pay | Admitting: Internal Medicine

## 2023-09-01 DIAGNOSIS — G969 Disorder of central nervous system, unspecified: Secondary | ICD-10-CM

## 2023-09-01 DIAGNOSIS — G250 Essential tremor: Secondary | ICD-10-CM

## 2023-09-01 DIAGNOSIS — R251 Tremor, unspecified: Secondary | ICD-10-CM

## 2023-09-01 DIAGNOSIS — J4520 Mild intermittent asthma, uncomplicated: Secondary | ICD-10-CM

## 2023-09-01 DIAGNOSIS — I10 Essential (primary) hypertension: Secondary | ICD-10-CM

## 2023-10-18 ENCOUNTER — Ambulatory Visit (INDEPENDENT_AMBULATORY_CARE_PROVIDER_SITE_OTHER): Payer: PPO

## 2023-10-18 VITALS — Ht 70.0 in | Wt 197.0 lb

## 2023-10-18 DIAGNOSIS — Z1211 Encounter for screening for malignant neoplasm of colon: Secondary | ICD-10-CM

## 2023-10-18 DIAGNOSIS — Z0000 Encounter for general adult medical examination without abnormal findings: Secondary | ICD-10-CM | POA: Diagnosis not present

## 2023-10-18 NOTE — Patient Instructions (Addendum)
 Jesse Bell , Thank you for taking time out of your busy schedule to complete your Annual Wellness Visit with me. I enjoyed our conversation and look forward to speaking with you again next year. I, as well as your care team,  appreciate your ongoing commitment to your health goals. Please review the following plan we discussed and let me know if I can assist you in the future. Your Game plan/ To Do List    Referrals: If you haven't heard from the office you've been referred to, please reach out to them at the phone provided.  Ordered a Cologuard test Follow up Visits: Next Medicare AWV with our clinical staff: 10/21/2024   Have you seen your provider in the last 6 months (3 months if uncontrolled diabetes)? Yes Next Office Visit with your provider: 01/21/2024  Clinician Recommendations:  Aim for 30 minutes of exercise or brisk walking, 6-8 glasses of water, and 5 servings of fruits and vegetables each day. Educated and advised on getting the Shingles vaccines in 2025.      This is a list of the screening recommended for you and due dates:  Health Maintenance  Topic Date Due   Zoster (Shingles) Vaccine (1 of 2) Never done   Cologuard (Stool DNA test)  09/14/2023   Flu Shot  11/09/2023   Medicare Annual Wellness Visit  10/17/2024   DTaP/Tdap/Td vaccine (2 - Td or Tdap) 08/06/2030   Pneumococcal Vaccination  Completed   COVID-19 Vaccine  Completed   Hepatitis C Screening  Completed   HIV Screening  Completed   Hepatitis B Vaccine  Aged Out   HPV Vaccine  Aged Out   Meningitis B Vaccine  Aged Out    Advanced directives: (Declined) Advance directive discussed with you today. Even though you declined this today, please call our office should you change your mind, and we can give you the proper paperwork for you to fill out. Advance Care Planning is important because it:  [x]  Makes sure you receive the medical care that is consistent with your values, goals, and preferences  [x]  It provides  guidance to your family and loved ones and reduces their decisional burden about whether or not they are making the right decisions based on your wishes.  Follow the link provided in your after visit summary or read over the paperwork we have mailed to you to help you started getting your Advance Directives in place. If you need assistance in completing these, please reach out to us  so that we can help you!

## 2023-10-18 NOTE — Progress Notes (Signed)
 Subjective:   Jesse Bell is a 62 y.o. who presents for a Medicare Wellness preventive visit.  As a reminder, Annual Wellness Visits don't include a physical exam, and some assessments may be limited, especially if this visit is performed virtually. We may recommend an in-person follow-up visit with your provider if needed.  Visit Complete: Virtual I connected with  Jesse Bell on 10/18/23 by a audio enabled telemedicine application and verified that I am speaking with the correct person using two identifiers.  Patient Location: Home  Provider Location: Office/Clinic  I discussed the limitations of evaluation and management by telemedicine. The patient expressed understanding and agreed to proceed.  Vital Signs: Because this visit was a virtual/telehealth visit, some criteria may be missing or patient reported. Any vitals not documented were not able to be obtained and vitals that have been documented are patient reported.  VideoDeclined- This patient declined Librarian, academic. Therefore the visit was completed with audio only.  Persons Participating in Visit: Patient.  AWV Questionnaire: Yes: Patient Medicare AWV questionnaire was completed by the patient on 10/11/2023; I have confirmed that all information answered by patient is correct and no changes since this date.  Cardiac Risk Factors include: advanced age (>78men, >42 women);dyslipidemia;male gender;hypertension     Objective:    Today's Vitals   10/18/23 1306  Weight: 197 lb (89.4 kg)  Height: 5' 10 (1.778 m)   Body mass index is 28.27 kg/m.     06/29/2023   12:51 PM 10/16/2022    1:17 PM 06/22/2022    2:52 PM 12/08/2021    3:01 PM 06/14/2021    1:27 PM 05/06/2021    1:51 PM 03/18/2020    1:07 PM  Advanced Directives  Does Patient Have a Medical Advance Directive? Yes No No No No No No  Type of Advance Directive Living will        Would patient like information on creating a medical  advance directive?  No - Patient declined    No - Patient declined No - Patient declined    Current Medications (verified) Outpatient Encounter Medications as of 10/18/2023  Medication Sig   albuterol  (VENTOLIN  HFA) 108 (90 Base) MCG/ACT inhaler Inhale 2 puffs into the lungs every 6 (six) hours as needed for wheezing or shortness of breath (cough, shortness of breath or wheezing.).   aspirin  EC 81 MG tablet Take 1 tablet (81 mg total) by mouth daily. Swallow whole.   ipratropium (ATROVENT ) 0.03 % nasal spray Place 2 sprays into both nostrils 2 (two) times daily as needed for rhinitis.   levothyroxine  (SYNTHROID ) 25 MCG tablet Take 1 tablet (25 mcg total) by mouth daily before breakfast. TAKE 1 TABLET(25 MCG) BY MOUTH DAILY   primidone  (MYSOLINE ) 50 MG tablet TAKE 3 TABLETS BY MOUTH IN THE MORNING, TAKE 2 TABLETS IN THE AFTERNOON AND 2 TABS IN THE EVENING   propranolol  ER (INDERAL  LA) 60 MG 24 hr capsule TAKE 1 CAPSULE BY MOUTH EVERY DAY   rosuvastatin  (CRESTOR ) 40 MG tablet TAKE 1 TABLET BY MOUTH EVERY DAY   traZODone  (DESYREL ) 50 MG tablet TAKE 1 TABLET BY MOUTH AT BEDTIME AS NEEDED FOR SLEEP.   TRELEGY ELLIPTA  100-62.5-25 MCG/ACT AEPB TAKE 1 PUFF BY MOUTH EVERY DAY   No facility-administered encounter medications on file as of 10/18/2023.    Allergies (verified) Lisinopril    History: Past Medical History:  Diagnosis Date   Allergy    Back pain    Hyperlipidemia  Hypertension    Hypertriglyceridemia    Hypothyroid    Past Surgical History:  Procedure Laterality Date   FRACTURE SURGERY     x6   KNEE SURGERY     Family History  Problem Relation Age of Onset   Congestive Heart Failure Mother    Pulmonary fibrosis Father    Pulmonary fibrosis Brother        lung transplant   Heart Problems Brother    Skin cancer Brother    Macular degeneration Brother    Social History   Socioeconomic History   Marital status: Significant Other    Spouse name: Nathanel   Number of  children: 1   Years of education: Not on file   Highest education level: GED or equivalent  Occupational History   Occupation: disabled  Tobacco Use   Smoking status: Never   Smokeless tobacco: Never  Vaping Use   Vaping status: Never Used  Substance and Sexual Activity   Alcohol use: Not Currently    Alcohol/week: 1.0 - 2.0 standard drink of alcohol    Types: 1 - 2 Cans of beer per week    Comment: 1-2 times a week    Drug use: No   Sexual activity: Not Currently  Other Topics Concern   Not on file  Social History Narrative   Right handed   Lives with girlfriend Jesse Bell)   Disabled   Social Drivers of Health   Financial Resource Strain: Low Risk  (10/18/2023)   Overall Financial Resource Strain (CARDIA)    Difficulty of Paying Living Expenses: Not very hard  Food Insecurity: Food Insecurity Present (10/18/2023)   Hunger Vital Sign    Worried About Running Out of Food in the Last Year: Sometimes true    Ran Out of Food in the Last Year: Sometimes true  Transportation Needs: No Transportation Needs (10/18/2023)   PRAPARE - Administrator, Civil Service (Medical): No    Lack of Transportation (Non-Medical): No  Physical Activity: Insufficiently Active (10/18/2023)   Exercise Vital Sign    Days of Exercise per Week: 6 days    Minutes of Exercise per Session: 20 min  Stress: Stress Concern Present (10/18/2023)   [employer] of Occupational Health - Occupational Stress Questionnaire    Feeling of Stress: To some extent  Social Connections: Moderately Isolated (10/18/2023)   Social Connection and Isolation Panel    Frequency of Communication with Friends and Family: Three times a week    Frequency of Social Gatherings with Friends and Family: Twice a week    Attends Religious Services: Never    Database administrator or Organizations: No    Attends Engineer, structural: Never    Marital Status: Living with partner    Tobacco Counseling Counseling  given: No    Clinical Intake:  Pre-visit preparation completed: Yes  Pain : No/denies pain     BMI - recorded: 28.27 Nutritional Status: BMI 25 -29 Overweight Nutritional Risks: None Diabetes: No  No results found for: HGBA1C   How often do you need to have someone help you when you read instructions, pamphlets, or other written materials from your doctor or pharmacy?: 1 - Never  Interpreter Needed?: No  Information entered by :: Jesse Bell, CMA   Activities of Daily Living     10/18/2023    1:10 PM 10/11/2023    5:22 PM  In your present state of health, do you have any difficulty performing the  following activities:  Hearing? 0 0  Vision? 0 0  Difficulty concentrating or making decisions? 0 0  Walking or climbing stairs? 1 0  Comment uses a cane   Dressing or bathing? 0 0  Doing errands, shopping? 0 0  Preparing Food and eating ? N N  Using the Toilet? N N  In the past six months, have you accidently leaked urine? N N  Do you have problems with loss of bowel control? N N  Managing your Medications? N N  Managing your Finances? N N  Housekeeping or managing your Housekeeping? N N    Patient Care Team: Joshua Debby CROME, MD as PCP - General (Internal Medicine) Tat, Asberry RAMAN, DO as Consulting Physician (Neurology) Cammie Batters, OD as Referring Physician (Optometry)  I have updated your Care Teams any recent Medical Services you may have received from other providers in the past year.     Assessment:   This is a routine wellness examination for [facility].  Hearing/Vision screen Hearing Screening - Comments:: Denies hearing difficulties   Vision Screening - Comments:: Wears rx glasses - up to date with routine eye exams with [facility]   Goals Addressed               This Visit's Progress     Patient Stated (pt-stated)        Patient stated he plans to continue to exercise daily and work on guitars       Depression Screen     10/18/2023    1:11  PM 10/16/2022    1:26 PM 03/01/2022    2:15 PM 05/06/2021    1:54 PM 05/06/2021    1:50 PM 08/05/2020   10:42 AM 03/18/2020    1:04 PM  PHQ 2/9 Scores  PHQ - 2 Score 0 0 0 0 0 0 0  PHQ- 9 Score 0 1         Fall Risk     10/18/2023    1:10 PM 10/11/2023    5:22 PM 06/29/2023   12:51 PM 12/26/2022    2:59 PM 10/16/2022    1:18 PM  Fall Risk   Falls in the past year? 0 0 0 0 0  Number falls in past yr: 0 0 0 0 0  Injury with Fall? 0 0 0 0 0  Risk for fall due to : No Fall Risks    No Fall Risks;Medication side effect  Follow up Falls evaluation completed;Falls prevention discussed   Falls evaluation completed Falls prevention discussed    MEDICARE RISK AT HOME:  Medicare Risk at Home Any stairs in or around the home?: Yes If so, are there any without handrails?: No Home free of loose throw rugs in walkways, pet beds, electrical cords, etc?: Yes Adequate lighting in your home to reduce risk of falls?: Yes Life alert?: No Use of a cane, walker or w/c?: Yes Grab bars in the bathroom?: Yes Shower chair or bench in shower?: No Elevated toilet seat or a handicapped toilet?: No  TIMED UP AND GO:  Was the test performed?  No  Cognitive Function: 6CIT completed        10/18/2023    1:18 PM 10/16/2022    1:19 PM 03/18/2020    1:02 PM 12/19/2018    1:07 PM 12/06/2016    1:29 PM  6CIT Screen  What Year? 0 points 0 points 0 points 0 points 0 points  What month? 0 points 0 points 0 points  0 points 0 points  What time? 0 points 0 points 0 points 0 points 0 points  Count back from 20 0 points 0 points 0 points 0 points 0 points  Months in reverse 0 points 0 points 0 points 0 points 0 points  Repeat phrase 2 points 0 points 0 points 0 points 2 points  Total Score 2 points 0 points 0 points 0 points 2 points    Immunizations Immunization History  Administered Date(s) Administered   Influenza, Mdck, Trivalent,PF 6+ MOS(egg free) 01/16/2023   Influenza,inj,Quad PF,6+ Mos 02/12/2017,  02/08/2018   Influenza-Unspecified 02/12/2017, 02/04/2020   PFIZER(Purple Top)SARS-COV-2 Vaccination 06/28/2019, 07/19/2019, 02/02/2020   PNEUMOCOCCAL CONJUGATE-20 08/05/2020   Pfizer(Comirnaty)Fall Seasonal Vaccine 12 years and older 01/16/2023   Tdap 08/05/2020    Screening Tests Health Maintenance  Topic Date Due   Zoster Vaccines- Shingrix  (1 of 2) Never done   Fecal DNA (Cologuard)  09/14/2023   INFLUENZA VACCINE  11/09/2023   Medicare Annual Wellness (AWV)  10/17/2024   DTaP/Tdap/Td (2 - Td or Tdap) 08/06/2030   Pneumococcal Vaccine 73-42 Years old  Completed   COVID-19 Vaccine  Completed   Hepatitis C Screening  Completed   HIV Screening  Completed   Hepatitis B Vaccines  Aged Out   HPV VACCINES  Aged Out   Meningococcal B Vaccine  Aged Out    Health Maintenance  Health Maintenance Due  Topic Date Due   Zoster Vaccines- Shingrix  (1 of 2) Never done   Fecal DNA (Cologuard)  09/14/2023   Health Maintenance Items Addressed:  Cologuard Ordered today  Additional Screening:  Vision Screening: Recommended annual ophthalmology exams for early detection of glaucoma and other disorders of the eye. Would you like a referral to an eye doctor? No    Dental Screening: Recommended annual dental exams for proper oral hygiene  Community Resource Referral / Chronic Care Management: CRR required this visit?  No   CCM required this visit?  No   Plan:    I have personally reviewed and noted the following in the patient's chart:   Medical and social history Use of alcohol, tobacco or illicit drugs  Current medications and supplements including opioid prescriptions. Patient is not currently taking opioid prescriptions. Functional ability and status Nutritional status Physical activity Advanced directives List of other physicians Hospitalizations, surgeries, and ER visits in previous 12 months Vitals Screenings to include cognitive, depression, and falls Referrals and  appointments  In addition, I have reviewed and discussed with patient certain preventive protocols, quality metrics, and best practice recommendations. A written personalized care plan for preventive services as well as general preventive health recommendations were provided to patient.   Jesse Bell, CMA   10/18/2023   After Visit Summary: (MyChart) Due to this being a telephonic visit, the after visit summary with patients personalized plan was offered to patient via MyChart   Notes: Nothing significant to report at this time.

## 2023-11-01 DIAGNOSIS — Z1211 Encounter for screening for malignant neoplasm of colon: Secondary | ICD-10-CM | POA: Diagnosis not present

## 2023-11-08 LAB — COLOGUARD: COLOGUARD: NEGATIVE

## 2023-11-14 ENCOUNTER — Other Ambulatory Visit: Payer: Self-pay | Admitting: Internal Medicine

## 2023-11-14 DIAGNOSIS — G479 Sleep disorder, unspecified: Secondary | ICD-10-CM

## 2023-11-15 NOTE — Telephone Encounter (Signed)
 Last OV 07/19/23 MWV 10/18/23 Next OV 01/21/24  Last refill 08/21/23 Qty #90/1

## 2023-12-02 ENCOUNTER — Other Ambulatory Visit: Payer: Self-pay | Admitting: Internal Medicine

## 2023-12-02 ENCOUNTER — Other Ambulatory Visit: Payer: Self-pay | Admitting: Neurology

## 2023-12-02 DIAGNOSIS — I10 Essential (primary) hypertension: Secondary | ICD-10-CM

## 2023-12-02 DIAGNOSIS — G969 Disorder of central nervous system, unspecified: Secondary | ICD-10-CM

## 2023-12-02 DIAGNOSIS — G250 Essential tremor: Secondary | ICD-10-CM

## 2023-12-12 ENCOUNTER — Other Ambulatory Visit: Payer: Self-pay | Admitting: Internal Medicine

## 2023-12-12 DIAGNOSIS — E785 Hyperlipidemia, unspecified: Secondary | ICD-10-CM

## 2023-12-31 NOTE — Progress Notes (Unsigned)
 Assessment/Plan:    1.  Essential Tremor  -Continue primidone , 50 mg, 3 in the AM, 2 in the afternoon and 2 in the evening  -Patient on Inderal  LA, 60 mg daily by primary care for blood pressure and tremor.    -Also told the patient that we could add in as needed IR propranolol , 10-20 mg.  He thinks doing okay right now  -Discussed DBS again.  Discussed focused ultrasound again.  He has fairly significant tremor on the right>left.  We discussed that he does have health team advantage and he cannot go to [facility] for this procedure without a lot of exceptions, which could take a few months.  He doesn't want to proceed  -[facility] is being considered as an alternative.  Pt has tried/failed at least 2 tremor medications, as required by Medicare.  Patients [employer] Tremor ADL form completed today and ability to cut food with a knife and fork, using a spoon was greater than or equal to 3.     Subjective:   Jesse Bell was seen today in follow up for tremor.  Patient continues to take his primidone  and propranolol  LA.  Thus far, he has not been interested in surgical interventions.  He remains on primidone  and propranolol .  He has not had any syncopal episodes, but when he saw his primary care physician in April, he did describe an episode of low blood pressure after taking both his propranolol  and olmesartan  in the morning instead of taking 1 in the morning and 1 in the afternoon.  He has changed his schedule to separate them again.  Current prescribed movement disorder medications: Primidone , 50 mg, 3 in the morning, 2 at noon and 2 at bedtime  Inderal  LA, 60 mg daily (prescribed by primary care).   ALLERGIES:   Allergies  Allergen Reactions   Lisinopril  Cough    COUGH    CURRENT MEDICATIONS:  No outpatient medications have been marked as taking for the 01/01/24 encounter (Appointment) with Kiyo Heal, Asberry RAMAN, DO.      Objective:    PHYSICAL EXAMINATION:    VITALS:   There were  no vitals filed for this visit.   GEN:  The patient appears stated age and is in NAD. HEENT:  Normocephalic, atraumatic.  The mucous membranes are moist. The superficial temporal arteries are without ropiness or tenderness. CV:  RRR Lungs:  CTAB   Neurological examination:  Orientation: The patient is alert and oriented x3. Cranial nerves: There is good facial symmetry. The speech is fluent and clear. Soft palate rises symmetrically and there is no tongue deviation. Hearing is intact to conversational tone. Sensation: Sensation is intact to light touch throughout Motor: Strength is at least antigravity x4.  Movement examination: Tone: There is normal tone in the bilateral upper extremities.  The tone in the lower extremities is normal.  Abnormal movements: No rest tremor.  There is mod postural and intention tremor on the R.  Trouble getting pen to paper for archimedes spirals on the R Coordination:  There is no decremation with RAM's, with any form of RAMS, including alternating supination and pronation of the forearm, hand opening and closing, finger taps, heel taps and toe taps. Gait and Station: The patient has no difficulty arising out of a deep-seated chair without the use of the hands. The patient's stride length is good with antalgic gait because 1 leg is shorter than the other (uses a cane as well).  This is  stable from prior visit. I have reviewed and interpreted the following labs independently   Chemistry      Component Value Date/Time   NA 136 07/19/2023 1413   NA 139 05/14/2019 1412   K 5.0 07/19/2023 1413   CL 100 07/19/2023 1413   CO2 28 07/19/2023 1413   BUN 12 07/19/2023 1413   BUN 14 05/14/2019 1412   CREATININE 0.62 07/19/2023 1413   CREATININE 0.87 02/25/2016 1227      Component Value Date/Time   CALCIUM  9.2 07/19/2023 1413   ALKPHOS 62 07/19/2023 1413   AST 20 07/19/2023 1413   ALT 13 07/19/2023 1413   BILITOT 0.4 07/19/2023 1413   BILITOT 0.4 05/14/2019  1412      Lab Results  Component Value Date   WBC 8.6 07/19/2023   HGB 13.6 07/19/2023   HCT 41.5 07/19/2023   MCV 86.3 07/19/2023   PLT 426.0 (H) 07/19/2023   Lab Results  Component Value Date   TSH 1.85 07/19/2023     Chemistry      Component Value Date/Time   NA 136 07/19/2023 1413   NA 139 05/14/2019 1412   K 5.0 07/19/2023 1413   CL 100 07/19/2023 1413   CO2 28 07/19/2023 1413   BUN 12 07/19/2023 1413   BUN 14 05/14/2019 1412   CREATININE 0.62 07/19/2023 1413   CREATININE 0.87 02/25/2016 1227      Component Value Date/Time   CALCIUM  9.2 07/19/2023 1413   ALKPHOS 62 07/19/2023 1413   AST 20 07/19/2023 1413   ALT 13 07/19/2023 1413   BILITOT 0.4 07/19/2023 1413   BILITOT 0.4 05/14/2019 1412         Total time spent on today's visit was *** minutes, including both face-to-face time and nonface-to-face time.  Time included that spent on review of records (prior notes available to me/labs/imaging if pertinent), discussing treatment and goals, answering patient's questions and coordinating care (most of visit was spent on phone trying to coordinate trial information).  Cc:  Joshua Debby CROME, MD

## 2024-01-01 ENCOUNTER — Ambulatory Visit: Admitting: Neurology

## 2024-01-01 ENCOUNTER — Encounter: Payer: Self-pay | Admitting: Neurology

## 2024-01-01 DIAGNOSIS — G250 Essential tremor: Secondary | ICD-10-CM

## 2024-01-01 MED ORDER — PRIMIDONE 50 MG PO TABS
ORAL_TABLET | ORAL | 2 refills | Status: AC
Start: 2024-01-01 — End: ?

## 2024-01-01 NOTE — Patient Instructions (Signed)
 Let us  know if you decide to proceed with surgery and we will need to do the cognitive testing.  The physicians and staff at [facility] are committed to providing excellent care. You may receive a survey requesting feedback about your experience at our office. We strive to receive very good responses to the survey questions. If you feel that your experience would prevent you from giving the office a very good  response, please contact our office to try to remedy the situation. We may be reached at [phone]. Thank you for taking the time out of your busy day to complete the survey.

## 2024-01-13 ENCOUNTER — Other Ambulatory Visit: Payer: Self-pay | Admitting: Internal Medicine

## 2024-01-13 DIAGNOSIS — J4520 Mild intermittent asthma, uncomplicated: Secondary | ICD-10-CM

## 2024-01-21 ENCOUNTER — Encounter: Payer: Self-pay | Admitting: Internal Medicine

## 2024-01-21 ENCOUNTER — Ambulatory Visit: Admitting: Internal Medicine

## 2024-01-21 VITALS — BP 156/102 | HR 77 | Temp 97.0°F | Resp 18 | Wt 210.6 lb

## 2024-01-21 DIAGNOSIS — R9431 Abnormal electrocardiogram [ECG] [EKG]: Secondary | ICD-10-CM

## 2024-01-21 DIAGNOSIS — E039 Hypothyroidism, unspecified: Secondary | ICD-10-CM

## 2024-01-21 DIAGNOSIS — J301 Allergic rhinitis due to pollen: Secondary | ICD-10-CM | POA: Diagnosis not present

## 2024-01-21 DIAGNOSIS — R5383 Other fatigue: Secondary | ICD-10-CM | POA: Diagnosis not present

## 2024-01-21 DIAGNOSIS — E7841 Elevated Lipoprotein(a): Secondary | ICD-10-CM | POA: Diagnosis not present

## 2024-01-21 DIAGNOSIS — I10 Essential (primary) hypertension: Secondary | ICD-10-CM

## 2024-01-21 LAB — BASIC METABOLIC PANEL WITH GFR
BUN: 8 mg/dL (ref 6–23)
CO2: 27 meq/L (ref 19–32)
Calcium: 8.7 mg/dL (ref 8.4–10.5)
Chloride: 102 meq/L (ref 96–112)
Creatinine, Ser: 0.63 mg/dL (ref 0.40–1.50)
GFR: 101 mL/min (ref >60.00)
Glucose, Bld: 106 mg/dL — ABNORMAL HIGH (ref 70–99)
Potassium: 4.1 meq/L (ref 3.5–5.1)
Sodium: 137 meq/L (ref 135–145)

## 2024-01-21 LAB — CBC WITH DIFFERENTIAL/PLATELET
Basophils Absolute: 0.1 K/uL (ref 0.0–0.1)
Basophils Relative: 1.2 % (ref 0.0–3.0)
Eosinophils Absolute: 0.2 K/uL (ref 0.0–0.7)
Eosinophils Relative: 3.2 % (ref 0.0–5.0)
HCT: 42.1 % (ref 39.0–52.0)
Hemoglobin: 13.7 g/dL (ref 13.0–17.0)
Lymphocytes Relative: 22.3 % (ref 12.0–46.0)
Lymphs Abs: 1.6 K/uL (ref 0.7–4.0)
MCHC: 32.6 g/dL (ref 30.0–36.0)
MCV: 87.6 fl (ref 78.0–100.0)
Monocytes Absolute: 0.6 K/uL (ref 0.1–1.0)
Monocytes Relative: 7.9 % (ref 3.0–12.0)
Neutro Abs: 4.6 K/uL (ref 1.4–7.7)
Neutrophils Relative %: 65.4 % (ref 43.0–77.0)
Platelets: 309 K/uL (ref 150.0–400.0)
RBC: 4.81 Mil/uL (ref 4.22–5.81)
RDW: 14.2 % (ref 11.5–15.5)
WBC: 7 K/uL (ref 4.0–10.5)

## 2024-01-21 LAB — URINALYSIS, ROUTINE W REFLEX MICROSCOPIC
Bilirubin Urine: NEGATIVE
Hgb urine dipstick: NEGATIVE
Ketones, ur: NEGATIVE
Leukocytes,Ua: NEGATIVE
Nitrite: NEGATIVE
Specific Gravity, Urine: 1.01 (ref 1.000–1.030)
Total Protein, Urine: NEGATIVE
Urine Glucose: NEGATIVE
Urobilinogen, UA: 0.2 (ref 0.0–1.0)
pH: 7 (ref 5.0–8.0)

## 2024-01-21 LAB — TSH: TSH: 2.25 u[IU]/mL (ref 0.35–5.50)

## 2024-01-21 LAB — TROPONIN I (HIGH SENSITIVITY): High Sens Troponin I: 4 ng/L (ref 2–17)

## 2024-01-21 MED ORDER — OLMESARTAN MEDOXOMIL 20 MG PO TABS
20.0000 mg | ORAL_TABLET | Freq: Every day | ORAL | 0 refills | Status: AC
Start: 2024-01-21 — End: ?

## 2024-01-21 MED ORDER — LEVOCETIRIZINE DIHYDROCHLORIDE 5 MG PO TABS: 5.0000 mg | ORAL_TABLET | Freq: Every evening | ORAL | 0 refills | Status: AC

## 2024-01-21 MED ORDER — LEVOTHYROXINE SODIUM 25 MCG PO TABS
25.0000 ug | ORAL_TABLET | Freq: Every day | ORAL | 0 refills | Status: AC
Start: 2024-01-21 — End: ?

## 2024-01-21 MED ORDER — INDAPAMIDE 1.25 MG PO TABS: 1.2500 mg | ORAL_TABLET | Freq: Every day | ORAL | 0 refills | Status: DC

## 2024-01-21 NOTE — Patient Instructions (Signed)
 Hypertension, Adult High blood pressure (hypertension) is when the force of blood pumping through the arteries is too strong. The arteries are the blood vessels that carry blood from the heart throughout the body. Hypertension forces the heart to work harder to pump blood and may cause arteries to become narrow or stiff. Untreated or uncontrolled hypertension can lead to a heart attack, heart failure, a stroke, kidney disease, and other problems. A blood pressure reading consists of a higher number over a lower number. Ideally, your blood pressure should be below 120/80. The first ("top") number is called the systolic pressure. It is a measure of the pressure in your arteries as your heart beats. The second ("bottom") number is called the diastolic pressure. It is a measure of the pressure in your arteries as the heart relaxes. What are the causes? The exact cause of this condition is not known. There are some conditions that result in high blood pressure. What increases the risk? Certain factors may make you more likely to develop high blood pressure. Some of these risk factors are under your control, including: Smoking. Not getting enough exercise or physical activity. Being overweight. Having too much fat, sugar, calories, or salt (sodium) in your diet. Drinking too much alcohol. Other risk factors include: Having a personal history of heart disease, diabetes, high cholesterol, or kidney disease. Stress. Having a family history of high blood pressure and high cholesterol. Having obstructive sleep apnea. Age. The risk increases with age. What are the signs or symptoms? High blood pressure may not cause symptoms. Very high blood pressure (hypertensive crisis) may cause: Headache. Fast or irregular heartbeats (palpitations). Shortness of breath. Nosebleed. Nausea and vomiting. Vision changes. Severe chest pain, dizziness, and seizures. How is this diagnosed? This condition is diagnosed by  measuring your blood pressure while you are seated, with your arm resting on a flat surface, your legs uncrossed, and your feet flat on the floor. The cuff of the blood pressure monitor will be placed directly against the skin of your upper arm at the level of your heart. Blood pressure should be measured at least twice using the same arm. Certain conditions can cause a difference in blood pressure between your right and left arms. If you have a high blood pressure reading during one visit or you have normal blood pressure with other risk factors, you may be asked to: Return on a different day to have your blood pressure checked again. Monitor your blood pressure at home for 1 week or longer. If you are diagnosed with hypertension, you may have other blood or imaging tests to help your health care provider understand your overall risk for other conditions. How is this treated? This condition is treated by making healthy lifestyle changes, such as eating healthy foods, exercising more, and reducing your alcohol intake. You may be referred for counseling on a healthy diet and physical activity. Your health care provider may prescribe medicine if lifestyle changes are not enough to get your blood pressure under control and if: Your systolic blood pressure is above 130. Your diastolic blood pressure is above 80. Your personal target blood pressure may vary depending on your medical conditions, your age, and other factors. Follow these instructions at home: Eating and drinking  Eat a diet that is high in fiber and potassium, and low in sodium, added sugar, and fat. An example of this eating plan is called the DASH diet. DASH stands for Dietary Approaches to Stop Hypertension. To eat this way: Eat  plenty of fresh fruits and vegetables. Try to fill one half of your plate at each meal with fruits and vegetables. Eat whole grains, such as whole-wheat pasta, brown rice, or whole-grain bread. Fill about one  fourth of your plate with whole grains. Eat or drink low-fat dairy products, such as skim milk or low-fat yogurt. Avoid fatty cuts of meat, processed or cured meats, and poultry with skin. Fill about one fourth of your plate with lean proteins, such as fish, chicken without skin, beans, eggs, or tofu. Avoid pre-made and processed foods. These tend to be higher in sodium, added sugar, and fat. Reduce your daily sodium intake. Many people with hypertension should eat less than 1,500 mg of sodium a day. Do not drink alcohol if: Your health care provider tells you not to drink. You are pregnant, may be pregnant, or are planning to become pregnant. If you drink alcohol: Limit how much you have to: 0-1 drink a day for women. 0-2 drinks a day for men. Know how much alcohol is in your drink. In the [place] one drink equals one 12 oz bottle of beer (355 mL), one 5 oz glass of wine (148 mL), or one 1 oz glass of hard liquor (44 mL). Lifestyle  Work with your health care provider to maintain a healthy body weight or to lose weight. Ask what an ideal weight is for you. Get at least 30 minutes of exercise that causes your heart to beat faster (aerobic exercise) most days of the week. Activities may include walking, swimming, or biking. Include exercise to strengthen your muscles (resistance exercise), such as Pilates or lifting weights, as part of your weekly exercise routine. Try to do these types of exercises for 30 minutes at least 3 days a week. Do not use any products that contain nicotine or tobacco. These products include cigarettes, chewing tobacco, and vaping devices, such as e-cigarettes. If you need help quitting, ask your health care provider. Monitor your blood pressure at home as told by your health care provider. Keep all follow-up visits. This is important. Medicines Take over-the-counter and prescription medicines only as told by your health care provider. Follow directions carefully. Blood  pressure medicines must be taken as prescribed. Do not skip doses of blood pressure medicine. Doing this puts you at risk for problems and can make the medicine less effective. Ask your health care provider about side effects or reactions to medicines that you should watch for. Contact a health care provider if you: Think you are having a reaction to a medicine you are taking. Have headaches that keep coming back (recurring). Feel dizzy. Have swelling in your ankles. Have trouble with your vision. Get help right away if you: Develop a severe headache or confusion. Have unusual weakness or numbness. Feel faint. Have severe pain in your chest or abdomen. Vomit repeatedly. Have trouble breathing. These symptoms may be an emergency. Get help right away. Call 911. Do not wait to see if the symptoms will go away. Do not drive yourself to the hospital. Summary Hypertension is when the force of blood pumping through your arteries is too strong. If this condition is not controlled, it may put you at risk for serious complications. Your personal target blood pressure may vary depending on your medical conditions, your age, and other factors. For most people, a normal blood pressure is less than 120/80. Hypertension is treated with lifestyle changes, medicines, or a combination of both. Lifestyle changes include losing weight, eating a healthy,  low-sodium diet, exercising more, and limiting alcohol. This information is not intended to replace advice given to you by your health care provider. Make sure you discuss any questions you have with your health care provider. Document Revised: 02/01/2021 Document Reviewed: 02/01/2021 Elsevier Patient Education  2024 [employer].

## 2024-01-21 NOTE — Progress Notes (Unsigned)
 Subjective:  Patient ID: Jesse Bell, male    DOB: April 12, 1960  Age: 63 y.o. MRN: [id number]  CC: Hypertension, Hyperlipidemia, and Hypothyroidism (6 month follow up )   HPI Jesse Bell presents for f/up ----  Discussed the use of AI scribe software for clinical note transcription with the patient, who gave verbal consent to proceed.  History of Present Illness Jesse Bell is a 63 year old male who presents with tremors and sinus congestion.  He identifies tremors as his primary concern but does not provide details regarding his duration, frequency, or impact on daily activities.  He experiences sinus congestion, describing his sinuses as 'stuffy'. He was previously taking Allegra D for this condition but has not taken it for about a month due to pharmacy restrictions on the quantity dispensed. He associates his recent cough with post-nasal drip from his sinus issues.  He has a history of hypertension and is currently taking propranolol . His blood pressure readings at home are generally not high, as he usually measures them in the afternoon after taking his medication. He denies headache, blurred vision, chest pain, shortness of breath, or swelling.  He reports feeling fatigued at times but denies significant weight gain or loss. He mentions being slightly heavier than usual but does not provide specific details.     Outpatient Medications Prior to Visit  Medication Sig Dispense Refill   albuterol  (VENTOLIN  HFA) 108 (90 Base) MCG/ACT inhaler Inhale 2 puffs into the lungs every 6 (six) hours as needed for wheezing or shortness of breath (cough, shortness of breath or wheezing.). 18 g 3   aspirin  EC 81 MG tablet Take 1 tablet (81 mg total) by mouth daily. Swallow whole. 90 tablet 1   ipratropium (ATROVENT ) 0.03 % nasal spray Place 2 sprays into both nostrils 2 (two) times daily as needed for rhinitis. 90 mL 1   primidone  (MYSOLINE ) 50 MG tablet TAKE 3 TABLETS BY MOUTH IN THE MORNING,  TAKE 2 TABLETS IN THE AFTERNOON AND 2 TABS IN THE EVENING 630 tablet 2   propranolol  ER (INDERAL  LA) 60 MG 24 hr capsule TAKE 1 CAPSULE BY MOUTH EVERY DAY 90 capsule 0   rosuvastatin  (CRESTOR ) 40 MG tablet TAKE 1 TABLET BY MOUTH EVERY DAY 90 tablet 1   traZODone  (DESYREL ) 50 MG tablet TAKE 1 TABLET BY MOUTH AT BEDTIME AS NEEDED FOR SLEEP. 90 tablet 1   TRELEGY ELLIPTA  100-62.5-25 MCG/ACT AEPB TAKE 1 PUFF BY MOUTH EVERY DAY 60 each 1   levothyroxine  (SYNTHROID ) 25 MCG tablet Take 1 tablet (25 mcg total) by mouth daily before breakfast. TAKE 1 TABLET(25 MCG) BY MOUTH DAILY 90 tablet 1   No facility-administered medications prior to visit.    ROS Review of Systems  Constitutional:  Positive for fatigue and unexpected weight change. Negative for appetite change, chills, diaphoresis and fever.  HENT:  Positive for congestion. Negative for postnasal drip, rhinorrhea, sinus pressure, sinus pain, sneezing and sore throat.   Eyes:  Negative for visual disturbance.  Respiratory: Negative.  Negative for cough, chest tightness, shortness of breath and wheezing.   Cardiovascular:  Negative for chest pain, palpitations and leg swelling.  Gastrointestinal: Negative.  Negative for abdominal pain, constipation, diarrhea, nausea and vomiting.  Genitourinary: Negative.  Negative for difficulty urinating and dysuria.  Musculoskeletal:  Positive for gait problem. Negative for myalgias.  Skin: Negative.   Neurological:  Positive for tremors. Negative for dizziness, light-headedness and numbness.  Hematological:  Negative for adenopathy. Does  not bruise/bleed easily.  Psychiatric/Behavioral: Negative.      Objective:  BP (!) 156/102 (BP Location: Right Arm, Patient Position: Sitting, Cuff Size: Large) Comment: BP (L) 152/94  Pulse 77   Temp (!) 97 F (36.1 C) (Oral)   Resp 18   Wt 210 lb 9.6 oz (95.5 kg)   SpO2 97%   BMI 30.22 kg/m   BP Readings from Last 3 Encounters:  01/21/24 (!) 156/102  01/01/24  132/80  07/19/23 118/78    Wt Readings from Last 3 Encounters:  01/21/24 210 lb 9.6 oz (95.5 kg)  01/01/24 209 lb 12.8 oz (95.2 kg)  10/18/23 197 lb (89.4 kg)    Physical Exam Vitals reviewed.  Constitutional:      Appearance: Normal appearance.  HENT:     Nose: Nose normal.     Mouth/Throat:     Mouth: Mucous membranes are moist.  Eyes:     General: No scleral icterus.    Conjunctiva/sclera: Conjunctivae normal.  Cardiovascular:     Rate and Rhythm: Normal rate and regular rhythm.     Pulses: Normal pulses.     Heart sounds: No murmur heard.    No friction rub. No gallop.     Comments: EKG--- NSR, 65 bpm NS ST segment changes in precordial leads No LVH or Q waves Unchanged Pulmonary:     Effort: Pulmonary effort is normal.     Breath sounds: No stridor. No wheezing, rhonchi or rales.  Abdominal:     General: Abdomen is flat.     Palpations: There is no mass.     Tenderness: There is no abdominal tenderness. There is no guarding.     Hernia: No hernia is present.  Musculoskeletal:        General: Normal range of motion.     Cervical back: Neck supple.     Right lower leg: No edema.     Left lower leg: No edema.  Lymphadenopathy:     Cervical: No cervical adenopathy.  Skin:    General: Skin is warm and dry.     Findings: No rash.  Neurological:     General: No focal deficit present.     Mental Status: He is alert. Mental status is at baseline.  Psychiatric:        Mood and Affect: Mood normal.        Behavior: Behavior normal.     Lab Results  Component Value Date   WBC 7.0 01/21/2024   HGB 13.7 01/21/2024   HCT 42.1 01/21/2024   PLT 309.0 01/21/2024   GLUCOSE 106 (H) 01/21/2024   CHOL 191 07/19/2023   TRIG 112.0 07/19/2023   HDL 50.20 07/19/2023   LDLDIRECT 108.0 11/14/2022   LDLCALC 119 (H) 07/19/2023   ALT 13 07/19/2023   AST 20 07/19/2023   NA 137 01/21/2024   K 4.1 01/21/2024   CL 102 01/21/2024   CREATININE 0.63 01/21/2024   BUN 8  01/21/2024   CO2 27 01/21/2024   TSH 2.25 01/21/2024   PSA 0.12 07/19/2023    No results found.  Assessment & Plan:   Primary hypertension- EKG is negative for LVH.  Will try to achieve better BP control. -     CBC with Differential/Platelet; Future -     TSH; Future -     Urinalysis, Routine w reflex microscopic; Future -     Basic metabolic panel with GFR; Future -     Aldosterone + renin activity w/  ratio; Future -     Indapamide; Take 1 tablet (1.25 mg total) by mouth daily.  Dispense: 90 tablet; Refill: 0 -     Olmesartan  Medoxomil; Take 1 tablet (20 mg total) by mouth daily.  Dispense: 90 tablet; Refill: 0 -     AMB Referral [facility] Care Management -     EKG 12-Lead  Acquired hypothyroidism- He is euthyroid. -     TSH; Future -     Levothyroxine  Sodium; Take 1 tablet (25 mcg total) by mouth daily before breakfast. TAKE 1 TABLET(25 MCG) BY MOUTH DAILY  Dispense: 90 tablet; Refill: 0  Seasonal allergic rhinitis due to pollen -     Levocetirizine Dihydrochloride; Take 1 tablet (5 mg total) by mouth every evening.  Dispense: 90 tablet; Refill: 0  Abnormal electrocardiogram (ECG) (EKG) -     Troponin I (High Sensitivity); Future -     CT CORONARY MORPH W/CTA COR W/SCORE W/CA W/CM &/OR WO/CM; Future -     EKG 12-Lead  High serum lipoprotein(a) -     CT CORONARY MORPH W/CTA COR W/SCORE W/CA W/CM &/OR WO/CM; Future  Other fatigue -     CT CORONARY MORPH W/CTA COR W/SCORE W/CA W/CM &/OR WO/CM; Future     Follow-up: Return in about 3 months (around 04/22/2024).  Debby Molt, MD

## 2024-01-23 ENCOUNTER — Ambulatory Visit: Payer: Self-pay | Admitting: Internal Medicine

## 2024-01-23 ENCOUNTER — Telehealth: Payer: Self-pay | Admitting: *Deleted

## 2024-01-23 NOTE — Progress Notes (Signed)
 Care Guide Pharmacy Note  01/23/2024 Name: SABAS FRETT MRN: [id number] DOB: [birth year]  Referred By: Joshua Debby CROME, MD Reason for referral: Call Attempt #1 and Complex Care Management (Outreach to schedule referral with pharmacist )   ARROW TOMKO is a 63 y.o. year old male who is a primary care patient of Joshua Debby CROME, MD.  Roslynn DELENA Ala was referred to the pharmacist for assistance related to: HTN  An unsuccessful telephone outreach was attempted today to contact the patient who was referred to the pharmacy team for assistance with medication management. Additional attempts will be made to contact the patient.  Thedford Franks, CMA [place]  [facility], [facility] Guide Direct Dial: [phone]  Fax: [phone] Website: [email or web address]

## 2024-01-24 NOTE — Progress Notes (Signed)
 Care Guide Pharmacy Note  01/24/2024 Name: Jesse Bell MRN: [id number] DOB: 1960/11/02  Referred By: Joshua Debby CROME, MD Reason for referral: Call Attempt #1 and Complex Care Management (Outreach to schedule referral with pharmacist )   Jesse Bell is a 63 y.o. year old male who is a primary care patient of Joshua Debby CROME, MD.  Jesse Bell Ala was referred to the pharmacist for assistance related to: HTN  A second unsuccessful telephone outreach was attempted today to contact the patient who was referred to the pharmacy team for assistance with medication management. Additional attempts will be made to contact the patient.  [facility], [facility] Guide  Direct Dial: [phone]  Fax [phone]

## 2024-01-25 NOTE — Progress Notes (Signed)
 Care Guide Pharmacy Note  01/25/2024 Name: Jesse Bell MRN: [id number] DOB: 1960-06-16  Referred By: Joshua Debby CROME, MD Reason for referral: Call Attempt #1 and Complex Care Management (Outreach to schedule referral with pharmacist )   Jesse Bell is a 63 y.o. year old male who is a primary care patient of Joshua Debby CROME, MD.  Jesse Bell was referred to the pharmacist for assistance related to: HTN  A third unsuccessful telephone outreach was attempted today to contact the patient who was referred to the pharmacy team for assistance with medication management. The Population Health team is pleased to engage with this patient at any time in the future upon receipt of referral and should he/she be interested in assistance from the [facility] team.  .Debbe Fuse [facility]  [facility], [facility] Guide  Direct Dial: [phone]  Fax [phone]

## 2024-02-10 LAB — ALDOSTERONE + RENIN ACTIVITY W/ RATIO
ALDO / PRA Ratio: 8.1 ratio (ref 0.9–28.9)
Aldosterone: 3 ng/dL
Renin Activity: 0.37 ng/mL/h (ref 0.25–5.82)

## 2024-02-15 ENCOUNTER — Telehealth: Payer: Self-pay | Admitting: *Deleted

## 2024-02-15 ENCOUNTER — Encounter (HOSPITAL_COMMUNITY): Payer: Self-pay

## 2024-02-15 NOTE — Progress Notes (Signed)
 Care Guide Pharmacy Note  02/15/2024 Name: [name] MRN: [id number] DOB: [birth year]  Referred By: Joshua Debby CROME, MD Reason for referral: Complex Care Management (Scheduling referral with pharmacist )   Jesse Bell is a 63 y.o. year old male who is a primary care patient of Joshua Debby CROME, MD.  Roslynn DELENA Ala was referred to the pharmacist for assistance related to: HTN  Successful contact was made with the patient to discuss pharmacy services including being ready for the pharmacist to call at least 5 minutes before the scheduled appointment time and to have medication bottles and any blood pressure readings ready for review. The patient agreed to meet with the pharmacist via telephone visit on 02/25/2024  Thedford Franks, CMA [place]  [facility], [facility] Guide Direct Dial: [phone]  Fax: [phone] Website: [email or web address]

## 2024-02-19 ENCOUNTER — Ambulatory Visit (HOSPITAL_COMMUNITY)
Admission: RE | Admit: 2024-02-19 | Source: Ambulatory Visit | Attending: Internal Medicine | Admitting: Internal Medicine

## 2024-02-19 ENCOUNTER — Encounter (HOSPITAL_COMMUNITY): Payer: Self-pay

## 2024-02-21 ENCOUNTER — Other Ambulatory Visit: Admitting: Pharmacist

## 2024-02-21 VITALS — BP 125/75

## 2024-02-21 DIAGNOSIS — J301 Allergic rhinitis due to pollen: Secondary | ICD-10-CM

## 2024-02-21 DIAGNOSIS — I10 Essential (primary) hypertension: Secondary | ICD-10-CM

## 2024-02-21 MED ORDER — FLUTICASONE PROPIONATE 50 MCG/ACT NA SUSP: 2.0000 | Freq: Every day | NASAL | 1 refills | Status: DC

## 2024-02-21 NOTE — Progress Notes (Signed)
 02/21/2024 Name: Jesse Bell MRN: [id number] DOB: Feb 01, 1961  Chief Complaint  Patient presents with   Hypertension   Medication Management    Jesse Bell is a 63 y.o. year old male who presented for a telephone visit.   They were referred to the pharmacist by their PCP for assistance in managing hypertension.    Subjective:  Care Team: Primary Care Provider: Joshua Debby CROME, MD ; Next Scheduled Visit: not scheduled   Medication Access/Adherence  Current Pharmacy:  [id number] [place], [place] - [address] AT [facility] OF [address]  [place] [address] Phone: [phone] Fax: [phone]   Patient reports affordability concerns with their medications: No  Patient reports access/transportation concerns to their pharmacy: No  Patient reports adherence concerns with their medications:  No    Pt complains of continued issues with congestion. Has been taking levocetirizine at betime. Uses ipratropium nasal spray PRN but it irritates his throat. Used to take OTC allegra-D which helped the most.  Hypertension:  Current medications: indapamide 1.25 mg daily, olmesartan  20 mg daily (started ~1 month ago), propranolol  60 mg daily Medications previously tried: lisinopril , metoprolol   Patient has a validated, automated, upper arm home BP cuff Current blood pressure readings readings:  127/83 112/78 115/74 125/75  Patient denies hypotensive s/sx including dizziness, lightheadedness.  Patient denies hypertensive symptoms including headache, chest pain, shortness of breath   Objective:  No results found for: HGBA1C  Lab Results  Component Value Date   CREATININE 0.63 01/21/2024   BUN 8 01/21/2024   NA 137 01/21/2024   K 4.1 01/21/2024   CL 102 01/21/2024   CO2 27 01/21/2024    Lab Results  Component Value Date   CHOL 191 07/19/2023   HDL 50.20 07/19/2023   LDLCALC 119 (H) 07/19/2023   LDLDIRECT 108.0 11/14/2022   TRIG  112.0 07/19/2023   CHOLHDL 4 07/19/2023    Medications Reviewed Today     Reviewed by Merceda Lela SAUNDERS, RPH (Pharmacist) on 02/21/24 at 1654  Med List Status: <None>   Medication Order Taking? Sig Documenting Provider Last Dose Status Informant  albuterol  (VENTOLIN  HFA) 108 (90 Base) MCG/ACT inhaler [id number]  Inhale 2 puffs into the lungs every 6 (six) hours as needed for wheezing or shortness of breath (cough, shortness of breath or wheezing.). Jesse Debby CROME, MD  Active   aspirin  EC 81 MG tablet [id number] Yes Take 1 tablet (81 mg total) by mouth daily. Swallow whole. Jesse Debby CROME, MD  Active   indapamide (LOZOL) 1.25 MG tablet [id number] Yes Take 1 tablet (1.25 mg total) by mouth daily. Jesse Debby CROME, MD  Active   ipratropium (ATROVENT ) 0.03 % nasal spray [id number]  Place 2 sprays into both nostrils 2 (two) times daily as needed for rhinitis. Jesse Debby CROME, MD  Active   levocetirizine (XYZAL) 5 MG tablet [id number]  Take 1 tablet (5 mg total) by mouth every evening. Jesse Debby CROME, MD  Active   levothyroxine  (SYNTHROID ) 25 MCG tablet [id number] Yes Take 1 tablet (25 mcg total) by mouth daily before breakfast. TAKE 1 TABLET(25 MCG) BY MOUTH DAILY Jesse Debby CROME, MD  Active   olmesartan  (BENICAR ) 20 MG tablet [id number] Yes Take 1 tablet (20 mg total) by mouth daily. Jesse Debby CROME, MD  Active   primidone  (MYSOLINE ) 50 MG tablet [id number] Yes TAKE 3 TABLETS BY MOUTH IN THE MORNING, TAKE 2 TABLETS IN THE AFTERNOON AND 2 TABS IN THE  EVENING TatAsberry RAMAN, DO  Active   propranolol  ER (INDERAL  LA) 60 MG 24 hr capsule [id number] Yes TAKE 1 CAPSULE BY MOUTH EVERY DAY Jesse Debby CROME, MD  Active   rosuvastatin  (CRESTOR ) 40 MG tablet [id number] Yes TAKE 1 TABLET BY MOUTH EVERY DAY Jesse Debby CROME, MD  Active   traZODone  (DESYREL ) 50 MG tablet [id number]  TAKE 1 TABLET BY MOUTH AT BEDTIME AS NEEDED FOR SLEEP. Jesse Debby CROME, MD  Active   TRELEGY ELLIPTA  100-62.5-25 MCG/ACT AEPB [id number]  TAKE 1  PUFF BY MOUTH EVERY DAY Jesse Debby CROME, MD  Active               Assessment/Plan:   Hypertension: - Currently controlled, BP goal <130/80 - Reviewed long term cardiovascular and renal outcomes of uncontrolled blood pressure - Reviewed appropriate blood pressure monitoring technique and reviewed goal blood pressure. Recommended to check home blood pressure and heart rate  - Recommend to continue current regimen  - Need updated BMP to check Scr and K after starting indapamide and olmesartan , to come within the next week  Allergies: -Counseled on avoiding Allegra-D due to increase in BP side effect - Recommend fluticasone nasal spray, rx sent   Follow Up Plan: walk-in lab  Darrelyn Drum, PharmD, BCPS, CPP Clinical Pharmacist Practitioner [facility] [phone]

## 2024-02-25 ENCOUNTER — Other Ambulatory Visit

## 2024-02-27 ENCOUNTER — Other Ambulatory Visit (INDEPENDENT_AMBULATORY_CARE_PROVIDER_SITE_OTHER)

## 2024-02-27 DIAGNOSIS — I10 Essential (primary) hypertension: Secondary | ICD-10-CM

## 2024-02-27 LAB — BASIC METABOLIC PANEL WITH GFR
BUN: 20 mg/dL (ref 6–23)
CO2: 25 meq/L (ref 19–32)
Calcium: 9.2 mg/dL (ref 8.4–10.5)
Chloride: 100 meq/L (ref 96–112)
Creatinine, Ser: 0.73 mg/dL (ref 0.40–1.50)
GFR: 96.54 mL/min (ref >60.00)
Glucose, Bld: 93 mg/dL (ref 70–99)
Potassium: 3.9 meq/L (ref 3.5–5.1)
Sodium: 135 meq/L (ref 135–145)

## 2024-02-28 ENCOUNTER — Other Ambulatory Visit: Payer: Self-pay | Admitting: Internal Medicine

## 2024-02-28 DIAGNOSIS — G969 Disorder of central nervous system, unspecified: Secondary | ICD-10-CM

## 2024-02-28 DIAGNOSIS — I10 Essential (primary) hypertension: Secondary | ICD-10-CM

## 2024-03-03 ENCOUNTER — Ambulatory Visit: Payer: Self-pay | Admitting: Pharmacist

## 2024-03-13 ENCOUNTER — Other Ambulatory Visit: Payer: Self-pay | Admitting: Internal Medicine

## 2024-03-13 DIAGNOSIS — J4520 Mild intermittent asthma, uncomplicated: Secondary | ICD-10-CM

## 2024-03-23 ENCOUNTER — Other Ambulatory Visit: Payer: Self-pay | Admitting: Internal Medicine

## 2024-03-23 DIAGNOSIS — J301 Allergic rhinitis due to pollen: Secondary | ICD-10-CM

## 2024-04-19 ENCOUNTER — Other Ambulatory Visit: Payer: Self-pay | Admitting: Internal Medicine

## 2024-04-19 DIAGNOSIS — I10 Essential (primary) hypertension: Secondary | ICD-10-CM

## 2024-04-21 ENCOUNTER — Other Ambulatory Visit: Payer: Self-pay | Admitting: Internal Medicine

## 2024-04-21 DIAGNOSIS — I10 Essential (primary) hypertension: Secondary | ICD-10-CM

## 2024-04-21 DIAGNOSIS — R251 Tremor, unspecified: Secondary | ICD-10-CM

## 2024-05-03 ENCOUNTER — Other Ambulatory Visit: Payer: Self-pay | Admitting: Internal Medicine

## 2024-05-03 DIAGNOSIS — G479 Sleep disorder, unspecified: Secondary | ICD-10-CM

## 2024-05-04 ENCOUNTER — Other Ambulatory Visit: Payer: Self-pay | Admitting: Internal Medicine

## 2024-05-04 DIAGNOSIS — J301 Allergic rhinitis due to pollen: Secondary | ICD-10-CM

## 2024-05-16 ENCOUNTER — Other Ambulatory Visit: Payer: Self-pay | Admitting: Internal Medicine

## 2024-05-16 DIAGNOSIS — E039 Hypothyroidism, unspecified: Secondary | ICD-10-CM

## 2024-10-21 ENCOUNTER — Ambulatory Visit
